# Patient Record
Sex: Female | Born: 1995 | Race: White | Hispanic: No | Marital: Married | Smoking: Never smoker
Health system: Southern US, Community
[De-identification: ages and names within clinical notes are randomized; demographics above are authoritative.]

## PROBLEM LIST (undated history)

## (undated) ENCOUNTER — Inpatient Hospital Stay (HOSPITAL_COMMUNITY): Payer: Self-pay

## (undated) DIAGNOSIS — K8020 Calculus of gallbladder without cholecystitis without obstruction: Secondary | ICD-10-CM

## (undated) DIAGNOSIS — Z9889 Other specified postprocedural states: Secondary | ICD-10-CM

## (undated) DIAGNOSIS — L259 Unspecified contact dermatitis, unspecified cause: Principal | ICD-10-CM

## (undated) DIAGNOSIS — G43909 Migraine, unspecified, not intractable, without status migrainosus: Secondary | ICD-10-CM

## (undated) DIAGNOSIS — N1370 Vesicoureteral-reflux, unspecified: Secondary | ICD-10-CM

## (undated) DIAGNOSIS — F419 Anxiety disorder, unspecified: Principal | ICD-10-CM

## (undated) DIAGNOSIS — N3281 Overactive bladder: Secondary | ICD-10-CM

## (undated) DIAGNOSIS — R61 Generalized hyperhidrosis: Secondary | ICD-10-CM

## (undated) DIAGNOSIS — J329 Chronic sinusitis, unspecified: Secondary | ICD-10-CM

## (undated) DIAGNOSIS — R112 Nausea with vomiting, unspecified: Secondary | ICD-10-CM

## (undated) DIAGNOSIS — E669 Obesity, unspecified: Secondary | ICD-10-CM

## (undated) DIAGNOSIS — B019 Varicella without complication: Secondary | ICD-10-CM

## (undated) DIAGNOSIS — F4010 Social phobia, unspecified: Secondary | ICD-10-CM

## (undated) DIAGNOSIS — Z9109 Other allergy status, other than to drugs and biological substances: Secondary | ICD-10-CM

## (undated) DIAGNOSIS — J301 Allergic rhinitis due to pollen: Secondary | ICD-10-CM

## (undated) DIAGNOSIS — K219 Gastro-esophageal reflux disease without esophagitis: Secondary | ICD-10-CM

## (undated) DIAGNOSIS — F329 Major depressive disorder, single episode, unspecified: Principal | ICD-10-CM

## (undated) HISTORY — PX: OTHER SURGICAL HISTORY: SHX169

## (undated) HISTORY — DX: Obesity, unspecified: E66.9

## (undated) HISTORY — DX: Vesicoureteral-reflux, unspecified: N13.70

## (undated) HISTORY — DX: Varicella without complication: B01.9

## (undated) HISTORY — DX: Overactive bladder: N32.81

## (undated) HISTORY — DX: Migraine, unspecified, not intractable, without status migrainosus: G43.909

## (undated) HISTORY — DX: Major depressive disorder, single episode, unspecified: F32.9

## (undated) HISTORY — DX: Allergic rhinitis due to pollen: J30.1

## (undated) HISTORY — DX: Social phobia, unspecified: F40.10

## (undated) HISTORY — DX: Other allergy status, other than to drugs and biological substances: Z91.09

## (undated) HISTORY — DX: Chronic sinusitis, unspecified: J32.9

## (undated) HISTORY — DX: Anxiety disorder, unspecified: F41.9

## (undated) HISTORY — DX: Unspecified contact dermatitis, unspecified cause: L25.9

## (undated) HISTORY — DX: Generalized hyperhidrosis: R61

---
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
## 2013-01-06 ENCOUNTER — Other Ambulatory Visit: Payer: Self-pay | Admitting: Family Medicine

## 2013-01-29 ENCOUNTER — Ambulatory Visit (INDEPENDENT_AMBULATORY_CARE_PROVIDER_SITE_OTHER): Payer: BC Managed Care – PPO | Admitting: Family Medicine

## 2013-01-29 ENCOUNTER — Encounter: Payer: Self-pay | Admitting: Family Medicine

## 2013-01-29 VITALS — BP 116/80 | HR 71 | Temp 99.0°F | Ht 63.25 in | Wt 190.4 lb

## 2013-01-29 DIAGNOSIS — E669 Obesity, unspecified: Secondary | ICD-10-CM

## 2013-01-29 DIAGNOSIS — F4010 Social phobia, unspecified: Secondary | ICD-10-CM

## 2013-01-29 DIAGNOSIS — J329 Chronic sinusitis, unspecified: Secondary | ICD-10-CM

## 2013-01-29 DIAGNOSIS — F419 Anxiety disorder, unspecified: Secondary | ICD-10-CM

## 2013-01-29 DIAGNOSIS — F329 Major depressive disorder, single episode, unspecified: Secondary | ICD-10-CM

## 2013-01-29 MED ORDER — HYDROCOD POLST-CHLORPHEN POLST 10-8 MG/5ML PO LQCR: 5.0000 mL | Freq: Every evening | ORAL | Status: DC | PRN

## 2013-01-29 MED ORDER — GUAIFENESIN ER 600 MG PO TB12: 600.0000 mg | ORAL_TABLET | Freq: Two times a day (BID) | ORAL | Status: DC

## 2013-01-29 MED ORDER — CITALOPRAM HYDROBROMIDE 20 MG PO TABS: 20.0000 mg | ORAL_TABLET | Freq: Every day | ORAL | Status: DC

## 2013-01-29 MED ORDER — AMOXICILLIN-POT CLAVULANATE 875-125 MG PO TABS: 1.0000 | ORAL_TABLET | Freq: Two times a day (BID) | ORAL | Status: DC

## 2013-01-29 NOTE — Patient Instructions (Addendum)
Hydrate, probiotics  Sinusitis Sinusitis is redness, soreness, and swelling (inflammation) of the paranasal sinuses. Paranasal sinuses are air pockets within the bones of your face (beneath the eyes, the middle of the forehead, or above the eyes). In healthy paranasal sinuses, mucus is able to drain out, and air is able to circulate through them by way of your nose. However, when your paranasal sinuses are inflamed, mucus and air can become trapped. This can allow bacteria and other germs to grow and cause infection. Sinusitis can develop quickly and last only a short time (acute) or continue over a long period (chronic). Sinusitis that lasts for more than 12 weeks is considered chronic.  CAUSES  Causes of sinusitis include:  Allergies.  Structural abnormalities, such as displacement of the cartilage that separates your nostrils (deviated septum), which can decrease the air flow through your nose and sinuses and affect sinus drainage.  Functional abnormalities, such as when the small hairs (cilia) that line your sinuses and help remove mucus do not work properly or are not present. SYMPTOMS  Symptoms of acute and chronic sinusitis are the same. The primary symptoms are pain and pressure around the affected sinuses. Other symptoms include:  Upper toothache.  Earache.  Headache.  Bad breath.  Decreased sense of smell and taste.  A cough, which worsens when you are lying flat.  Fatigue.  Fever.  Thick drainage from your nose, which often is green and may contain pus (purulent).  Swelling and warmth over the affected sinuses. DIAGNOSIS  Your caregiver will perform a physical exam. During the exam, your caregiver may:  Look in your nose for signs of abnormal growths in your nostrils (nasal polyps).  Tap over the affected sinus to check for signs of infection.  View the inside of your sinuses (endoscopy) with a special imaging device with a light attached (endoscope), which is  inserted into your sinuses. If your caregiver suspects that you have chronic sinusitis, one or more of the following tests may be recommended:  Allergy tests.  Nasal culture A sample of mucus is taken from your nose and sent to a lab and screened for bacteria.  Nasal cytology A sample of mucus is taken from your nose and examined by your caregiver to determine if your sinusitis is related to an allergy. TREATMENT  Most cases of acute sinusitis are related to a viral infection and will resolve on their own within 10 days. Sometimes medicines are prescribed to help relieve symptoms (pain medicine, decongestants, nasal steroid sprays, or saline sprays).  However, for sinusitis related to a bacterial infection, your caregiver will prescribe antibiotic medicines. These are medicines that will help kill the bacteria causing the infection.  Rarely, sinusitis is caused by a fungal infection. In theses cases, your caregiver will prescribe antifungal medicine. For some cases of chronic sinusitis, surgery is needed. Generally, these are cases in which sinusitis recurs more than 3 times per year, despite other treatments. HOME CARE INSTRUCTIONS   Drink plenty of water. Water helps thin the mucus so your sinuses can drain more easily.  Use a humidifier.  Inhale steam 3 to 4 times a day (for example, sit in the bathroom with the shower running).  Apply a warm, moist washcloth to your face 3 to 4 times a day, or as directed by your caregiver.  Use saline nasal sprays to help moisten and clean your sinuses.  Take over-the-counter or prescription medicines for pain, discomfort, or fever only as directed by your caregiver.  SEEK IMMEDIATE MEDICAL CARE IF:  You have increasing pain or severe headaches.  You have nausea, vomiting, or drowsiness.  You have swelling around your face.  You have vision problems.  You have a stiff neck.  You have difficulty breathing. MAKE SURE YOU:   Understand these  instructions.  Will watch your condition.  Will get help right away if you are not doing well or get worse. Document Released: 12/09/2005 Document Revised: 03/02/2012 Document Reviewed: 12/24/2011 [facility] Patient Information 2013 [place], [place].

## 2013-01-29 NOTE — Progress Notes (Signed)
Patient ID: Robin Suarez, female   DOB: 04-12-1996, 17 y.o.   MRN: [id number] STEVIE ERTLE [id number] 17-Sep-1996 01/29/2013      Progress Note-Follow Up  Subjective  Chief Complaint  Chief Complaint  Patient presents with  . chest congestion    X 3 weeks  . Fever    low grade X 1 week  . Sore Throat    X 2 weeks    HPI  Patient is a 17 year old Caucasian female who is in today with a three-week history of worsening congestion. She had a lot of head congestion initially but is now moving into the chest. She's had intermittent low-grade fevers and chills. She struggled with malaise and myalgias. She has nasal congestion productive of clear rhinorrhea. Has a cough which is keeping her up at night. Smoker dry but occasionally productive. No chest pain or palpitations. No shortness or breath GI or GU complaints. Has been using over-the-counter sinus medications and Zyrtec without any great relief. Also notes some sore throat and ear pressure.  Past Medical History  Diagnosis Date  . Nickel allergy   . Chicken pox as a child  . Overactive bladder   . Vesico-ureteral reflux   . 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 Obesity 11/11/2011  . Social anxiety disorder 11/11/2011  . Contact dermatitis 06/04/2012  . Sinusitis 01/29/2013    Past Surgical History  Procedure Date  . Broken arm     left, repair of ulna and radius  . Tubes in ears 17 yr old    Family History  Problem Relation Age of Onset  . Diabetes Father     type 2  . Other Father     muscular spasm of the heart  . Hyperlipidemia Father   . Hyperlipidemia Maternal Grandmother   . Hypertension Maternal Grandmother   . Diabetes Maternal Grandmother     type 2  . Other Paternal Grandmother     arrythmia  . Diabetes Paternal Grandmother     type 2  . Diabetes Paternal Grandfather     type 2  . Hyperlipidemia Paternal Grandfather   . Other Paternal Grandfather     fluid around the heart  . Cancer Maternal  Grandfather     laryngeal, alcohol and tobacco    History   Social History  . Marital Status: Single    Spouse Name: N/A    Number of Children: N/A  . Years of Education: N/A   Occupational History  . Not on file.   Social History Main Topics  . Smoking status: Never Smoker   . Smokeless tobacco: Never Used  . Alcohol Use: No  . Drug Use: No  . Sexually Active: No   Other Topics Concern  . Not on file   Social History Narrative   Homeschooled, junior HS level.No sibs.  Lives with mom and dad near [place].Dad smokes.  Dogs in home.  Well water.    Current Outpatient Prescriptions on File Prior to Visit  Medication Sig Dispense Refill  . citalopram (CELEXA) 20 MG tablet Take 1 tablet (20 mg total) by mouth daily.  30 tablet  5  . HYPERCARE 20 % external solution APPLY TOPICALLY AT BEDTIME.  60 mL  0    Allergies  Allergen Reactions  . Latex     rash  . Cefprozil   . Nickel     Rash with contact  . Sulfa Antibiotics     Review of Systems  Review of Systems  Constitutional: Positive for malaise/fatigue. Negative for fever.  HENT: Positive for ear pain, congestion and sore throat.   Eyes: Negative for discharge.  Respiratory: Positive for cough and sputum production. Negative for shortness of breath.   Cardiovascular: Negative for chest pain, palpitations and leg swelling.  Gastrointestinal: Negative for nausea, abdominal pain and diarrhea.  Genitourinary: Negative for dysuria.  Musculoskeletal: Negative for falls.  Skin: Negative for rash.  Neurological: Positive for headaches. Negative for loss of consciousness.  Endo/Heme/Allergies: Negative for polydipsia.  Psychiatric/Behavioral: Negative for depression and suicidal ideas. The patient is not nervous/anxious and does not have insomnia.     Objective  BP 116/80  Pulse 71  Temp 99 F (37.2 C) (Temporal)  Ht 5' 3.25" (1.607 m)  Wt 190 lb 6.4 oz (86.365 kg)  BMI 33.46 kg/m2  SpO2 99%  LMP  01/08/2013  Physical Exam  Physical Exam  Constitutional: She is oriented to person, place, and time and well-developed, well-nourished, and in no distress. No distress.  HENT:  Head: Normocephalic and atraumatic.       Left TM erythematous, nasal mucosa boggy and erythematous  Eyes: Conjunctivae normal are normal.  Neck: Neck supple. No thyromegaly present.  Cardiovascular: Normal rate, regular rhythm and normal heart sounds.   No murmur heard. Pulmonary/Chest: Effort normal and breath sounds normal. She has no wheezes.  Abdominal: She exhibits no distension and no mass.  Musculoskeletal: She exhibits no edema.  Lymphadenopathy:    She has no cervical adenopathy.  Neurological: She is alert and oriented to person, place, and time.  Skin: Skin is warm and dry. No rash noted. She is not diaphoretic.  Psychiatric: Memory, affect and judgment normal.    Lab Results  Component Value Date   TSH 1.701 11/11/2011   Lab Results  Component Value Date   WBC 8.6 11/11/2011   HGB 15.1 11/11/2011   HCT 44.2 11/11/2011   MCV 92.1 11/11/2011   PLT 278 11/11/2011   Lab Results  Component Value Date   CREATININE 0.82 11/11/2011   BUN 17 11/11/2011   NA 140 11/11/2011   K 4.5 11/11/2011   CL 106 11/11/2011   CO2 26 11/11/2011   Lab Results  Component Value Date   ALT 39* 11/11/2011   AST 27 11/11/2011   ALKPHOS 97 11/11/2011   BILITOT 1.1 11/11/2011     Assessment & Plan  Social anxiety disorder Doing great given refills on Citalopram  Obesity Continues to loose weight, exercise and eat better  Sinusitis Augmentin, Mucinex, probiotics and Tussionex, increase rest and fluids

## 2013-01-29 NOTE — Assessment & Plan Note (Signed)
Augmentin, Mucinex, probiotics and Tussionex, increase rest and fluids

## 2013-01-29 NOTE — Assessment & Plan Note (Signed)
Doing great given refills on Citalopram

## 2013-01-29 NOTE — Assessment & Plan Note (Signed)
Continues to loose weight, exercise and eat better

## 2013-02-19 ENCOUNTER — Ambulatory Visit: Payer: BC Managed Care – PPO | Admitting: Family Medicine

## 2013-03-11 ENCOUNTER — Telehealth: Payer: Self-pay

## 2013-03-11 MED ORDER — QUETIAPINE FUMARATE 100 MG PO TABS: 100.0000 mg | ORAL_TABLET | ORAL | Status: DC

## 2013-03-11 NOTE — Telephone Encounter (Signed)
Per md pt needs to continue with Celebrex also.   Left a detailed message on pts dad phone per previous request today.

## 2013-03-11 NOTE — Telephone Encounter (Signed)
Per MD after looking at the mood disorder questionnaire it is a negative screen BUT borderline. Start Seroquel 100 mg 1/2 tab X 1 day, then 1 tab po qhs X 1 week, then 2 tabs qhs X 1 day, then 3 tabs qhs. Come in for an appt in 1-2 weeks.  Pts dad informed and states he had already made pt an appt for next wed.   Pts dad would like to know if pt should continue taking her other medication? Please advise?  Pts dad stated to leave a message on vm if he doesn't anser.  RX sent to pharmacy

## 2013-03-17 ENCOUNTER — Ambulatory Visit (INDEPENDENT_AMBULATORY_CARE_PROVIDER_SITE_OTHER): Payer: BC Managed Care – PPO | Admitting: Family Medicine

## 2013-03-17 ENCOUNTER — Encounter: Payer: Self-pay | Admitting: Family Medicine

## 2013-03-17 VITALS — BP 106/68 | HR 57 | Temp 97.6°F | Ht 64.25 in | Wt 186.0 lb

## 2013-03-17 DIAGNOSIS — F341 Dysthymic disorder: Secondary | ICD-10-CM

## 2013-03-17 DIAGNOSIS — F419 Anxiety disorder, unspecified: Secondary | ICD-10-CM

## 2013-03-17 MED ORDER — OLANZAPINE 2.5 MG PO TABS: 2.5000 mg | ORAL_TABLET | Freq: Every day | ORAL | Status: DC

## 2013-03-17 MED ORDER — CITALOPRAM HYDROBROMIDE 20 MG PO TABS
40.0000 mg | ORAL_TABLET | Freq: Every day | ORAL | Status: DC
Start: 2013-03-17 — End: 2014-06-21

## 2013-03-21 ENCOUNTER — Encounter: Payer: Self-pay | Admitting: Family Medicine

## 2013-03-21 NOTE — Assessment & Plan Note (Signed)
Was going to add Seroquel but she is worried about weight gain. Is encouraged to add Zyprexa qhs and continue SSRI and Alprazolam sparingly.

## 2013-03-21 NOTE — Progress Notes (Signed)
Patient ID: Robin Suarez, female   DOB: Mar 21, 1996, 17 y.o.   MRN: [id number] Robin Suarez [id number] 02-Oct-1996 03/21/2013      Progress Note-Follow Up  Subjective  Chief Complaint  Chief Complaint  Patient presents with  . Follow-up    on Seroquel    HPI  Patient is a 17-year-old Caucasian is worsening depression and anxiety. After doing a bipolar screen which came back equivocal but suggestive of bipolar she was given a prescription for Seroquel but chose not to start it due to concerned about weight gain. She is having low mood and difficulty concentrating but denies any suicidal ideation or intent to harm. Does struggle with anhedonia. No return of agoraphobia. Continues to take her citalopram. No other acute illness. No fevers or chills, chest pain, palpitations, shortness of breath, GI or complaints  Past Medical History  Diagnosis Date  . Nickel allergy   . Chicken pox as a child  . Overactive bladder   . Vesico-ureteral reflux   . 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 Obesity 11/11/2011  . Social anxiety disorder 11/11/2011  . Contact dermatitis 06/04/2012  . Sinusitis 01/29/2013  . Anxiety and depression 11/11/2011    Past Surgical History  Procedure Laterality Date  . Broken arm      left, repair of ulna and radius  . Tubes in ears  17 yr old    Family History  Problem Relation Age of Onset  . Diabetes Father     type 2  . Other Father     muscular spasm of the heart  . Hyperlipidemia Father   . Hyperlipidemia Maternal Grandmother   . Hypertension Maternal Grandmother   . Diabetes Maternal Grandmother     type 2  . Other Paternal Grandmother     arrythmia  . Diabetes Paternal Grandmother     type 2  . Diabetes Paternal Grandfather     type 2  . Hyperlipidemia Paternal Grandfather   . Other Paternal Grandfather     fluid around the heart  . Cancer Maternal Grandfather     laryngeal, alcohol and tobacco    History   Social History  .  Marital Status: Single    Spouse Name: N/A    Number of Children: N/A  . Years of Education: N/A   Occupational History  . Not on file.   Social History Main Topics  . Smoking status: Never Smoker   . Smokeless tobacco: Never Used  . Alcohol Use: No  . Drug Use: No  . Sexually Active: No   Other Topics Concern  . Not on file   Social History Narrative   Homeschooled, junior HS level.   No sibs.  Lives with mom and dad near [place].   Dad smokes.  Dogs in home.  Well water.          Current Outpatient Prescriptions on File Prior to Visit  Medication Sig Dispense Refill  . HYPERCARE 20 % external solution APPLY TOPICALLY AT BEDTIME.  60 mL  0   No current facility-administered medications on file prior to visit.    Allergies  Allergen Reactions  . Latex     rash  . Cefprozil   . Nickel     Rash with contact  . Sulfa Antibiotics     Review of Systems  Review of Systems  Constitutional: Negative for fever and malaise/fatigue.  HENT: Negative for congestion.   Eyes: Negative for discharge.  Respiratory: Negative for shortness of  breath.   Cardiovascular: Negative for chest pain, palpitations and leg swelling.  Gastrointestinal: Negative for nausea, abdominal pain and diarrhea.  Genitourinary: Negative for dysuria.  Musculoskeletal: Negative for falls.  Skin: Negative for rash.  Neurological: Negative for loss of consciousness and headaches.  Endo/Heme/Allergies: Negative for polydipsia.  Psychiatric/Behavioral: Positive for depression. Negative for suicidal ideas. The patient is nervous/anxious. The patient does not have insomnia.     Objective  BP 106/68  Pulse 57  Temp(Src) 97.6 F (36.4 C) (Temporal)  Ht 5' 4.25" (1.632 m)  Wt 186 lb (84.369 kg)  BMI 31.68 kg/m2  SpO2 99%  LMP 03/03/2013  Physical Exam  Physical Exam  Constitutional: She is oriented to person, place, and time and well-developed, well-nourished, and in no distress. No distress.   HENT:  Head: Normocephalic and atraumatic.  Eyes: Conjunctivae are normal.  Neck: Neck supple. No thyromegaly present.  Cardiovascular: Normal rate, regular rhythm and normal heart sounds.   No murmur heard. Pulmonary/Chest: Effort normal and breath sounds normal. She has no wheezes.  Abdominal: She exhibits no distension and no mass.  Musculoskeletal: She exhibits no edema.  Lymphadenopathy:    She has no cervical adenopathy.  Neurological: She is alert and oriented to person, place, and time.  Skin: Skin is warm and dry. No rash noted. She is not diaphoretic.  Psychiatric: Memory, affect and judgment normal.    Lab Results  Component Value Date   TSH 1.701 11/11/2011   Lab Results  Component Value Date   WBC 8.6 11/11/2011   HGB 15.1 11/11/2011   HCT 44.2 11/11/2011   MCV 92.1 11/11/2011   PLT 278 11/11/2011   Lab Results  Component Value Date   CREATININE 0.82 11/11/2011   BUN 17 11/11/2011   NA 140 11/11/2011   K 4.5 11/11/2011   CL 106 11/11/2011   CO2 26 11/11/2011   Lab Results  Component Value Date   ALT 39* 11/11/2011   AST 27 11/11/2011   ALKPHOS 97 11/11/2011   BILITOT 1.1 11/11/2011     Assessment & Plan  Anxiety and depression Was going to add Seroquel but she is worried about weight gain. Is encouraged to add Zyprexa qhs and continue SSRI and Alprazolam sparingly.

## 2013-04-19 ENCOUNTER — Ambulatory Visit: Payer: BC Managed Care – PPO | Admitting: Family Medicine

## 2013-04-23 ENCOUNTER — Ambulatory Visit: Payer: BC Managed Care – PPO | Admitting: Family Medicine

## 2013-07-21 ENCOUNTER — Encounter: Payer: BC Managed Care – PPO | Admitting: Family Medicine

## 2013-07-21 ENCOUNTER — Ambulatory Visit: Payer: BC Managed Care – PPO | Admitting: Family Medicine

## 2014-06-21 ENCOUNTER — Encounter: Payer: Self-pay | Admitting: Family Medicine

## 2014-06-21 ENCOUNTER — Ambulatory Visit (HOSPITAL_BASED_OUTPATIENT_CLINIC_OR_DEPARTMENT_OTHER)
Admission: RE | Admit: 2014-06-21 | Discharge: 2014-06-21 | Disposition: A | Discharge status: Home / Self Care | Payer: BC Managed Care – PPO | Source: Ambulatory Visit | Attending: Family Medicine | Admitting: Family Medicine

## 2014-06-21 ENCOUNTER — Ambulatory Visit (INDEPENDENT_AMBULATORY_CARE_PROVIDER_SITE_OTHER): Payer: BC Managed Care – PPO | Admitting: Family Medicine

## 2014-06-21 VITALS — BP 122/64 | HR 102 | Temp 98.5°F | Ht 63.25 in | Wt 180.1 lb

## 2014-06-21 DIAGNOSIS — K8020 Calculus of gallbladder without cholecystitis without obstruction: Secondary | ICD-10-CM

## 2014-06-21 DIAGNOSIS — R740 Nonspecific elevation of levels of transaminase and lactic acid dehydrogenase [LDH]: Secondary | ICD-10-CM

## 2014-06-21 DIAGNOSIS — R748 Abnormal levels of other serum enzymes: Secondary | ICD-10-CM

## 2014-06-21 DIAGNOSIS — R7402 Elevation of levels of lactic acid dehydrogenase (LDH): Secondary | ICD-10-CM

## 2014-06-21 DIAGNOSIS — R7401 Elevation of levels of liver transaminase levels: Secondary | ICD-10-CM

## 2014-06-21 DIAGNOSIS — R634 Abnormal weight loss: Secondary | ICD-10-CM

## 2014-06-21 DIAGNOSIS — E669 Obesity, unspecified: Secondary | ICD-10-CM

## 2014-06-21 LAB — CBC
HEMATOCRIT: 42.8 % (ref 36.0–46.0)
Hemoglobin: 15 g/dL (ref 12.0–15.0)
MCH: 31.1 pg (ref 26.0–34.0)
MCHC: 35 g/dL (ref 30.0–36.0)
MCV: 88.6 fL (ref 78.0–100.0)
Platelets: 254 10*3/uL (ref 150–400)
RBC: 4.83 MIL/uL (ref 3.87–5.11)
RDW: 13.8 % (ref 11.5–15.5)
WBC: 4.9 10*3/uL (ref 4.0–10.5)

## 2014-06-21 NOTE — Patient Instructions (Signed)
Gillbert's syndrome   Cholelithiasis Cholelithiasis (also called gallstones) is a form of gallbladder disease in which gallstones form in your gallbladder. The gallbladder is an organ that stores bile made in the liver, which helps digest fats. Gallstones begin as small crystals and slowly grow into stones. Gallstone pain occurs when the gallbladder spasms and a gallstone is blocking the duct. Pain can also occur when a stone passes out of the duct.  RISK FACTORS  Being female.   Having multiple pregnancies. Health care providers sometimes advise removing diseased gallbladders before future pregnancies.   Being obese.  Eating a diet heavy in fried foods and fat.   Being older than 60 years and increasing age.   Prolonged use of medicines containing female hormones.   Having diabetes mellitus.   Rapidly losing weight.   Having a family history of gallstones (heredity).  SYMPTOMS  Nausea.   Vomiting.  Abdominal pain.   Yellowing of the skin (jaundice).   Sudden pain. It may persist from several minutes to several hours.  Fever.   Tenderness to the touch. In some cases, when gallstones do not move into the bile duct, people have no pain or symptoms. These are called "silent" gallstones.  TREATMENT Silent gallstones do not need treatment. In severe cases, emergency surgery may be required. Options for treatment include:  Surgery to remove the gallbladder. This is the most common treatment.  Medicines. These do not always work and may take 6-12 months or more to work.  Shock wave treatment (extracorporeal biliary lithotripsy). In this treatment an ultrasound machine sends shock waves to the gallbladder to break gallstones into smaller pieces that can pass into the intestines or be dissolved by medicine. HOME CARE INSTRUCTIONS   Only take over-the-counter or prescription medicines for pain, discomfort, or fever as directed by your health care Robin Suarez.    Follow a low-fat diet until seen again by your health care Robin Suarez. Fat causes the gallbladder to contract, which can result in pain.   Follow up with your health care Robin Suarez as directed. Attacks are almost always recurrent and surgery is usually required for permanent treatment.  SEEK IMMEDIATE MEDICAL CARE IF:   Your pain increases and is not controlled by medicines.   You have a fever or persistent symptoms for more than 2-3 days.   You have a fever and your symptoms suddenly get worse.   You have persistent nausea and vomiting.  MAKE SURE YOU:   Understand these instructions.  Will watch your condition.  Will get help right away if you are not doing well or get worse. Document Released: 12/05/2005 Document Revised: 08/11/2013 Document Reviewed: 06/02/2013 [facility] Patient Information 2015 [place], [place]. This information is not intended to replace advice given to you by your health care Paulena Servais. Make sure you discuss any questions you have with your health care Ammar Moffatt.

## 2014-06-22 LAB — HEPATIC FUNCTION PANEL
ALBUMIN: 4.7 g/dL (ref 3.5–5.2)
ALK PHOS: 64 U/L (ref 39–117)
ALT: 111 U/L — ABNORMAL HIGH (ref 0–35)
AST: 39 U/L — AB (ref 0–37)
Bilirubin, Direct: 0.5 mg/dL — ABNORMAL HIGH (ref 0.0–0.3)
Indirect Bilirubin: 1 mg/dL (ref 0.2–1.1)
TOTAL PROTEIN: 6.8 g/dL (ref 6.0–8.3)
Total Bilirubin: 1.5 mg/dL — ABNORMAL HIGH (ref 0.2–1.1)

## 2014-06-22 LAB — RENAL FUNCTION PANEL
ALBUMIN: 4.7 g/dL (ref 3.5–5.2)
BUN: 8 mg/dL (ref 6–23)
CHLORIDE: 102 meq/L (ref 96–112)
CO2: 22 mEq/L (ref 19–32)
CREATININE: 0.83 mg/dL (ref 0.50–1.10)
Calcium: 10 mg/dL (ref 8.4–10.5)
Glucose, Bld: 75 mg/dL (ref 70–99)
PHOSPHORUS: 3.1 mg/dL (ref 2.3–4.6)
Potassium: 4.3 mEq/L (ref 3.5–5.3)
Sodium: 137 mEq/L (ref 135–145)

## 2014-06-22 LAB — HEPATITIS PANEL, ACUTE
HCV Ab: NEGATIVE
HEP B S AG: NEGATIVE
Hep A IgM: NONREACTIVE
Hep B C IgM: NONREACTIVE

## 2014-06-25 ENCOUNTER — Encounter: Payer: Self-pay | Admitting: Family Medicine

## 2014-06-25 DIAGNOSIS — R748 Abnormal levels of other serum enzymes: Secondary | ICD-10-CM | POA: Insufficient documentation

## 2014-06-25 DIAGNOSIS — R634 Abnormal weight loss: Secondary | ICD-10-CM | POA: Insufficient documentation

## 2014-06-25 NOTE — Progress Notes (Signed)
Patient ID: Robin Suarez, female   DOB: September 05, 1996, 18 y.o.   MRN: [id number] Robin Suarez [id number] September 05, 1996 06/25/2014      Progress Note-Follow Up  Subjective  Chief Complaint  Chief Complaint  Patient presents with  . labs from Dr Danielle DessEagelman showed elevated liver enzymes    HPI  Patient is a 18 year old female in today for routine medical care. Patient is in today for followup on some elevated liver functions which were discovered at her OB/GYN visit. She denies any abdominal pain or change in bowel habits. Does acknowledge some recent weight loss without significant effort. No diarrhea. No fevers or chills. No chest pain, palpitations or shortness of breath.  Past Medical History  Diagnosis Date  . Nickel allergy   . Chicken pox as a child  . Overactive bladder   . Vesico-ureteral reflux   . 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 Obesity 11/11/2011  . Social anxiety disorder 11/11/2011  . Contact dermatitis 06/04/2012  . Sinusitis 01/29/2013  . Anxiety and depression 11/11/2011    Past Surgical History  Procedure Laterality Date  . Broken arm      left, repair of ulna and radius  . Tubes in ears  18 yr old    Family History  Problem Relation Age of Onset  . Diabetes Father     type 2  . Other Father     muscular spasm of the heart  . Hyperlipidemia Father   . Hyperlipidemia Maternal Grandmother   . Hypertension Maternal Grandmother   . Diabetes Maternal Grandmother     type 2  . Other Paternal Grandmother     arrythmia  . Diabetes Paternal Grandmother     type 2  . Diabetes Paternal Grandfather     type 2  . Hyperlipidemia Paternal Grandfather   . Other Paternal Grandfather     fluid around the heart  . Cancer Maternal Grandfather     laryngeal, alcohol and tobacco    History   Social History  . Marital Status: Single    Spouse Name: N/A    Number of Children: N/A  . Years of Education: N/A   Occupational History  . Not on file.    Social History Main Topics  . Smoking status: Never Smoker   . Smokeless tobacco: Never Used  . Alcohol Use: No  . Drug Use: No  . Sexual Activity: No   Other Topics Concern  . Not on file   Social History Narrative   Homeschooled, junior HS level.   No sibs.  [place].   Dad smokes.  Dogs in home.  Well water.          No current outpatient prescriptions on file prior to visit.   No current facility-administered medications on file prior to visit.    Allergies  Allergen Reactions  . Latex     rash  . Cefprozil   . Nickel     Rash with contact  . Sulfa Antibiotics     Review of Systems  Review of Systems  Constitutional: Negative for fever and malaise/fatigue.  HENT: Negative for congestion.   Eyes: Negative for discharge.  Respiratory: Negative for shortness of breath.   Cardiovascular: Negative for chest pain, palpitations and leg swelling.  Gastrointestinal: Negative for nausea, abdominal pain and diarrhea.  Genitourinary: Negative for dysuria.  Musculoskeletal: Negative for falls.  Skin: Negative for rash.  Neurological: Negative for loss of consciousness  and headaches.  Endo/Heme/Allergies: Negative for polydipsia.  Psychiatric/Behavioral: Negative for depression and suicidal ideas. The patient is not nervous/anxious and does not have insomnia.     Objective  BP 122/64  Pulse 102  Temp(Src) 98.5 F (36.9 C) (Oral)  Ht 5' 3.25" (1.607 m)  Wt 180 lb 1.9 oz (81.702 kg)  BMI 31.64 kg/m2  SpO2 97%  LMP 05/30/2014  Physical Exam  Physical Exam  Constitutional: She is oriented to person, place, and time and well-developed, well-nourished, and in no distress. No distress.  HENT:  Head: Normocephalic and atraumatic.  Eyes: Conjunctivae are normal.  Neck: Neck supple. No thyromegaly present.  Cardiovascular: Normal rate, regular rhythm and normal heart sounds.   No murmur heard. Pulmonary/Chest: Effort normal and breath  sounds normal. She has no wheezes.  Abdominal: She exhibits no distension and no mass.  Musculoskeletal: She exhibits no edema.  Lymphadenopathy:    She has no cervical adenopathy.  Neurological: She is alert and oriented to person, place, and time.  Skin: Skin is warm and dry. No rash noted. She is not diaphoretic.  Psychiatric: Memory, affect and judgment normal.    Lab Results  Component Value Date   TSH 1.701 11/11/2011   Lab Results  Component Value Date   WBC 4.9 06/21/2014   HGB 15.0 06/21/2014   HCT 42.8 06/21/2014   MCV 88.6 06/21/2014   PLT 254 06/21/2014   Lab Results  Component Value Date   CREATININE 0.83 06/21/2014   BUN 8 06/21/2014   NA 137 06/21/2014   K 4.3 06/21/2014   CL 102 06/21/2014   CO2 22 06/21/2014   Lab Results  Component Value Date   ALT 111* 06/21/2014   AST 39* 06/21/2014   ALKPHOS 64 06/21/2014   BILITOT 1.5* 06/21/2014   No results found for this basename: CHOL   No results found for this basename: HDL   No results found for this basename: LDLCALC   No results found for this basename: TRIG   No results found for this basename: CHOLHDL     Assessment & Plan  Obesity Encouraged DASH diet, decrease po intake and increase exercise as tolerated. Needs 7-8 hours of sleep nightly. Avoid trans fats, eat small, frequent meals every 4-5 hours with lean proteins, complex carbs and healthy fats. Minimize simple carbs  Abnormal AST and ALT With elevated bilirubin and gallstones, will refer to general surgery for further consideration

## 2014-06-25 NOTE — Assessment & Plan Note (Signed)
Encouraged DASH diet, decrease po intake and increase exercise as tolerated. Needs 7-8 hours of sleep nightly. Avoid trans fats, eat small, frequent meals every 4-5 hours with lean proteins, complex carbs and healthy fats. Minimize simple carbs 

## 2014-06-25 NOTE — Assessment & Plan Note (Signed)
With elevated bilirubin and gallstones, will refer to general surgery for further consideration

## 2014-06-27 ENCOUNTER — Telehealth: Payer: Self-pay

## 2014-06-27 NOTE — Telephone Encounter (Signed)
Message copied by Court JoyFREEMAN, Jensen Cheramie L on Mon Jun 27, 2014  9:39 AM ------      Message from: Danise EdgeBLYTH, STACEY A      Created: Sat Jun 25, 2014  4:40 PM       Reviewed her chart again, I think with her gallstones and elevated lfts and elevated bili I am going to refer her to general surgery for consideration ------

## 2014-06-27 NOTE — Telephone Encounter (Signed)
Will inform pt.

## 2014-06-27 NOTE — Telephone Encounter (Signed)
Pt informed and voiced understanding

## 2014-06-29 ENCOUNTER — Encounter: Payer: Self-pay | Admitting: Gastroenterology

## 2014-06-29 ENCOUNTER — Telehealth: Payer: Self-pay | Admitting: Family Medicine

## 2014-06-29 ENCOUNTER — Other Ambulatory Visit: Payer: Self-pay | Admitting: Family Medicine

## 2014-06-29 DIAGNOSIS — K8020 Calculus of gallbladder without cholecystitis without obstruction: Secondary | ICD-10-CM

## 2014-06-29 NOTE — Telephone Encounter (Signed)
Set referral

## 2014-06-29 NOTE — Telephone Encounter (Signed)
Patients mother left message stating that the patient does not want to have to go through a GI work up she would rather go to general surgeon who can remove her gallbladder.

## 2014-07-05 ENCOUNTER — Telehealth: Payer: Self-pay | Admitting: *Deleted

## 2014-07-05 NOTE — Telephone Encounter (Signed)
Pt left message on 07/04/14 requesting most recent labs and u/s results be faxed to Dr Ernestina PennaFogleman at [phone]. Pt has appt with her on 07/11/14. Info faxed and pt notified.

## 2014-07-15 ENCOUNTER — Ambulatory Visit (INDEPENDENT_AMBULATORY_CARE_PROVIDER_SITE_OTHER): Payer: BC Managed Care – PPO | Admitting: General Surgery

## 2014-07-15 ENCOUNTER — Encounter (INDEPENDENT_AMBULATORY_CARE_PROVIDER_SITE_OTHER): Payer: Self-pay | Admitting: General Surgery

## 2014-07-15 VITALS — BP 130/70 | HR 80 | Temp 98.0°F | Ht 64.0 in | Wt 169.0 lb

## 2014-07-15 DIAGNOSIS — R7401 Elevation of levels of liver transaminase levels: Secondary | ICD-10-CM

## 2014-07-15 DIAGNOSIS — K8020 Calculus of gallbladder without cholecystitis without obstruction: Secondary | ICD-10-CM | POA: Insufficient documentation

## 2014-07-15 DIAGNOSIS — R7402 Elevation of levels of lactic acid dehydrogenase (LDH): Secondary | ICD-10-CM

## 2014-07-15 DIAGNOSIS — R740 Nonspecific elevation of levels of transaminase and lactic acid dehydrogenase [LDH]: Secondary | ICD-10-CM

## 2014-07-15 DIAGNOSIS — R748 Abnormal levels of other serum enzymes: Secondary | ICD-10-CM

## 2014-07-15 NOTE — Progress Notes (Signed)
Patient ID: Robin Suarez, female   DOB: 11-26-1996, 18 y.o.   MRN: [id number]  Chief Complaint  Patient presents with  . eval gallstones    HPI Robin Suarez is a 18 y.o. female.   HPI 18 year old Caucasian female referred by Dr. Abner GreenspanBlyth for evaluation of elevated transaminases. The patient had weight issues and was placed on phentermine to help with weight loss. She underwent LFT surveillance and was found to have some elevated liver function test which prompted an abdominal ultrasound and referral to her primary care physician. She was found to have gallstones and a normal common bile duct on her ultrasound. She states initially she didn't have any abdominal discomfort however of late she's had a few episodes of abdominal discomfort. She had a remote episode after eating fried chicken tenders where she vomited without having abdominal pain. Most recently she has noticed some episodes with certain foods where she will get a sharp pain like a spasm in her right upper abdomen which will radiate to her back. This occurred with broccoli. It lasted for several hours. She tried times without any relief. She states she now has constant heartburn. She denies any melena, hematochezia, acholic stools. She denies any NSAID use. She denies any hematemesis. She has lost about 32 pounds as part of a planned weight loss.. Past Medical History  Diagnosis Date  . Nickel allergy   . Chicken pox as a child  . Overactive bladder   . Vesico-ureteral reflux   . 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 Obesity 11/11/2011  . Social anxiety disorder 11/11/2011  . Contact dermatitis 06/04/2012  . Sinusitis 01/29/2013  . Anxiety and depression 11/11/2011    Past Surgical History  Procedure Laterality Date  . Broken arm      left, repair of ulna and radius  . Tubes in ears  18 yr old    Family History  Problem Relation Age of Onset  . Diabetes Father     type 2  . Other Father     muscular spasm of the heart   . Hyperlipidemia Father   . Hyperlipidemia Maternal Grandmother   . Hypertension Maternal Grandmother   . Diabetes Maternal Grandmother     type 2  . Other Paternal Grandmother     arrythmia  . Diabetes Paternal Grandmother     type 2  . Diabetes Paternal Grandfather     type 2  . Hyperlipidemia Paternal Grandfather   . Other Paternal Grandfather     fluid around the heart  . Cancer Maternal Grandfather     laryngeal, alcohol and tobacco    Social History History  Substance Use Topics  . Smoking status: Never Smoker   . Smokeless tobacco: Never Used  . Alcohol Use: No    Allergies  Allergen Reactions  . Latex     rash  . Cefprozil   . Nickel     Rash with contact  . Sulfa Antibiotics     Current Outpatient Prescriptions  Medication Sig Dispense Refill  . cholecalciferol (VITAMIN D) 1000 UNITS tablet Take 1,000 Units by mouth daily.      . norethindrone-ethinyl estradiol (JUNEL FE,GILDESS FE,LOESTRIN FE) 1-20 MG-MCG tablet Take 1 tablet by mouth daily.      . phentermine 37.5 MG capsule Take 37.5 mg by mouth every morning.       No current facility-administered medications for this visit.    Review of Systems Review of Systems  Constitutional: Negative for fever,  activity change, appetite change and unexpected weight change.  HENT: Negative for nosebleeds and trouble swallowing.   Eyes: Negative for photophobia and visual disturbance.  Respiratory: Negative for chest tightness and shortness of breath.   Cardiovascular: Negative for chest pain and leg swelling.       Denies CP, SOB  Gastrointestinal: Positive for nausea and abdominal pain.  Genitourinary: Negative for dysuria and difficulty urinating.  Musculoskeletal: Negative for arthralgias.  Skin: Negative for pallor and rash.  Neurological: Negative for dizziness, seizures, facial asymmetry and numbness.          Hematological: Negative for adenopathy. Does not bruise/bleed easily.    Psychiatric/Behavioral: Negative for behavioral problems and agitation.    Blood pressure 130/70, pulse 80, temperature 98 F (36.7 C), height 5\' 4"  (1.626 m), weight 169 lb (76.658 kg), last menstrual period 05/30/2014.  Physical Exam Physical Exam  Vitals reviewed. Constitutional: She is oriented to person, place, and time. She appears well-developed and well-nourished. No distress.  HENT:  Head: Normocephalic and atraumatic.  Right Ear: External ear normal.  Left Ear: External ear normal.  Eyes: Conjunctivae are normal. No scleral icterus.  Neck: Normal range of motion. Neck supple. No tracheal deviation present. No thyromegaly present.  Cardiovascular: Normal rate and normal heart sounds.   Pulmonary/Chest: Effort normal and breath sounds normal. No stridor. No respiratory distress. She has no wheezes.  Abdominal: Soft. She exhibits no distension. There is no tenderness. There is no rebound and no guarding.    Musculoskeletal: She exhibits no edema and no tenderness.  Lymphadenopathy:    She has no cervical adenopathy.  Neurological: She is alert and oriented to person, place, and time. She exhibits normal muscle tone.  Skin: Skin is warm and dry. No rash noted. She is not diaphoretic. No erythema.  Psychiatric: She has a normal mood and affect. Her behavior is normal. Judgment and thought content normal.    Data Reviewed abd u/s - +gallstones; nml common bile duct Dr Mariel AloeBlyth's office note Labs - 06/21/14 - T bili 1.5; AST 111, ALT 39, AP 64  Assessment    Symptomatic cholelithiasis Elevated AST/ALT     Plan    I believe the patient's symptoms are consistent with gallbladder disease. However I did explain to the patient and her parents that her transaminase elevation could be due to passage of a gallstone or a side effect from her medication. Nonetheless she has developed some symptoms which are consistent with symptomatic cholelithiasis  We discussed gallbladder  disease. The patient was given Agricultural engineereducational material. We discussed non-operative and operative management. We discussed the signs & symptoms of acute cholecystitis  I discussed laparoscopic cholecystectomy with IOC in detail.  The patient was given educational material as well as diagrams detailing the procedure.  We discussed the risks and benefits of a laparoscopic cholecystectomy including, but not limited to bleeding, infection, injury to surrounding structures such as the intestine or liver, bile leak, retained gallstones, need to convert to an open procedure, prolonged diarrhea, blood clots such as  DVT, common bile duct injury, anesthesia risks, and possible need for additional procedures such as ERCP.  We discussed the typical post-operative recovery course. I explained that the likelihood of improvement of their symptoms is good.   We will repeat her LFTs prior to surgery. If her LFTs are still elevated and her interoperative CHOLANGIOGRAM is normal, I explained this may prompt additional workup. All of their questions were asked and answered.  She has elected to  proceed with surgery. Our schedulers will call her in the next several days to schedule surgery. In the interim, she was given access to watch EMMI video on cholecystectomy  Mary Sella. Andrey Campanile, MD, FACS General, Bariatric, & Minimally Invasive Surgery [facility], [place]        [facility] M 07/15/2014, 2:01 PM

## 2014-07-15 NOTE — Patient Instructions (Signed)
Your current discomfort is due to gallbladder disease Your elevated AST and ALT are more than likely due to passage of a gallstone; however, it may be due to a side effect from the medication Our office will contact you to schedule surgery in the next few days. Please call if you have any additional questions

## 2014-08-02 ENCOUNTER — Encounter (HOSPITAL_COMMUNITY): Payer: Self-pay | Admitting: Pharmacy Technician

## 2014-08-02 ENCOUNTER — Emergency Department (HOSPITAL_BASED_OUTPATIENT_CLINIC_OR_DEPARTMENT_OTHER)
Admission: EM | Admit: 2014-08-02 | Discharge: 2014-08-02 | Disposition: A | Discharge status: Home / Self Care | Payer: BC Managed Care – PPO | Attending: Emergency Medicine | Admitting: Emergency Medicine

## 2014-08-02 ENCOUNTER — Encounter (HOSPITAL_BASED_OUTPATIENT_CLINIC_OR_DEPARTMENT_OTHER): Payer: Self-pay | Admitting: Emergency Medicine

## 2014-08-02 DIAGNOSIS — Z87448 Personal history of other diseases of urinary system: Secondary | ICD-10-CM | POA: Diagnosis not present

## 2014-08-02 DIAGNOSIS — R1011 Right upper quadrant pain: Secondary | ICD-10-CM | POA: Diagnosis present

## 2014-08-02 DIAGNOSIS — Z8709 Personal history of other diseases of the respiratory system: Secondary | ICD-10-CM | POA: Insufficient documentation

## 2014-08-02 DIAGNOSIS — K8020 Calculus of gallbladder without cholecystitis without obstruction: Secondary | ICD-10-CM | POA: Diagnosis not present

## 2014-08-02 DIAGNOSIS — Z79899 Other long term (current) drug therapy: Secondary | ICD-10-CM | POA: Insufficient documentation

## 2014-08-02 DIAGNOSIS — Z8659 Personal history of other mental and behavioral disorders: Secondary | ICD-10-CM | POA: Insufficient documentation

## 2014-08-02 DIAGNOSIS — Z8619 Personal history of other infectious and parasitic diseases: Secondary | ICD-10-CM | POA: Diagnosis not present

## 2014-08-02 DIAGNOSIS — Z8719 Personal history of other diseases of the digestive system: Secondary | ICD-10-CM | POA: Diagnosis not present

## 2014-08-02 DIAGNOSIS — Z872 Personal history of diseases of the skin and subcutaneous tissue: Secondary | ICD-10-CM | POA: Diagnosis not present

## 2014-08-02 DIAGNOSIS — K8050 Calculus of bile duct without cholangitis or cholecystitis without obstruction: Secondary | ICD-10-CM

## 2014-08-02 DIAGNOSIS — Z3202 Encounter for pregnancy test, result negative: Secondary | ICD-10-CM | POA: Diagnosis not present

## 2014-08-02 DIAGNOSIS — E669 Obesity, unspecified: Secondary | ICD-10-CM | POA: Insufficient documentation

## 2014-08-02 DIAGNOSIS — Z91040 Latex allergy status: Secondary | ICD-10-CM | POA: Insufficient documentation

## 2014-08-02 HISTORY — DX: Calculus of gallbladder without cholecystitis without obstruction: K80.20

## 2014-08-02 LAB — CBC WITH DIFFERENTIAL/PLATELET
BASOS PCT: 0 % (ref 0–1)
Basophils Absolute: 0 10*3/uL (ref 0.0–0.1)
EOS ABS: 0.1 10*3/uL (ref 0.0–0.7)
Eosinophils Relative: 2 % (ref 0–5)
HCT: 42.6 % (ref 36.0–46.0)
HEMOGLOBIN: 14.9 g/dL (ref 12.0–15.0)
Lymphocytes Relative: 40 % (ref 12–46)
Lymphs Abs: 1.8 10*3/uL (ref 0.7–4.0)
MCH: 31.2 pg (ref 26.0–34.0)
MCHC: 35 g/dL (ref 30.0–36.0)
MCV: 89.3 fL (ref 78.0–100.0)
Monocytes Absolute: 0.4 10*3/uL (ref 0.1–1.0)
Monocytes Relative: 9 % (ref 3–12)
NEUTROS ABS: 2.3 10*3/uL (ref 1.7–7.7)
Neutrophils Relative %: 50 % (ref 43–77)
PLATELETS: 217 10*3/uL (ref 150–400)
RBC: 4.77 MIL/uL (ref 3.87–5.11)
RDW: 12 % (ref 11.5–15.5)
WBC: 4.6 10*3/uL (ref 4.0–10.5)

## 2014-08-02 LAB — URINALYSIS, ROUTINE W REFLEX MICROSCOPIC
GLUCOSE, UA: NEGATIVE mg/dL
Hgb urine dipstick: NEGATIVE
Ketones, ur: 15 mg/dL — AB
Nitrite: NEGATIVE
PH: 6 (ref 5.0–8.0)
Protein, ur: NEGATIVE mg/dL
SPECIFIC GRAVITY, URINE: 1.03 (ref 1.005–1.030)
Urobilinogen, UA: 1 mg/dL (ref 0.0–1.0)

## 2014-08-02 LAB — COMPREHENSIVE METABOLIC PANEL
ALK PHOS: 67 U/L (ref 39–117)
ALT: 75 U/L — AB (ref 0–35)
AST: 29 U/L (ref 0–37)
Albumin: 4.5 g/dL (ref 3.5–5.2)
Anion gap: 14 (ref 5–15)
BILIRUBIN TOTAL: 1.6 mg/dL — AB (ref 0.3–1.2)
BUN: 14 mg/dL (ref 6–23)
CHLORIDE: 101 meq/L (ref 96–112)
CO2: 24 mEq/L (ref 19–32)
Calcium: 10.7 mg/dL — ABNORMAL HIGH (ref 8.4–10.5)
Creatinine, Ser: 1 mg/dL (ref 0.50–1.10)
GFR calc Af Amer: >90 mL/min (ref >90)
GFR calc non Af Amer: 81 mL/min — ABNORMAL LOW (ref >90)
Glucose, Bld: 97 mg/dL (ref 70–99)
Potassium: 4.4 mEq/L (ref 3.7–5.3)
SODIUM: 139 meq/L (ref 137–147)
TOTAL PROTEIN: 7.6 g/dL (ref 6.0–8.3)

## 2014-08-02 LAB — PREGNANCY, URINE: Preg Test, Ur: NEGATIVE

## 2014-08-02 LAB — URINE MICROSCOPIC-ADD ON

## 2014-08-02 LAB — LIPASE, BLOOD: Lipase: 21 U/L (ref 11–59)

## 2014-08-02 MED ORDER — SODIUM CHLORIDE 0.9 % IV BOLUS (SEPSIS)
1000.0000 mL | Freq: Once | INTRAVENOUS | Status: AC
  Administered 2014-08-02: 1000 mL via INTRAVENOUS

## 2014-08-02 MED ORDER — ONDANSETRON HCL 4 MG/2ML IJ SOLN
4.0000 mg | Freq: Once | INTRAMUSCULAR | Status: AC
  Administered 2014-08-02: 4 mg via INTRAVENOUS
  Filled 2014-08-02: qty 2

## 2014-08-02 MED ORDER — RANITIDINE HCL 150 MG PO TABS: 150.0000 mg | ORAL_TABLET | Freq: Two times a day (BID) | ORAL | Status: DC

## 2014-08-02 MED ORDER — HYDROCODONE-ACETAMINOPHEN 5-325 MG PO TABS
2.0000 | ORAL_TABLET | ORAL | Status: DC | PRN
Start: 2014-08-02 — End: 2014-08-09

## 2014-08-02 MED ORDER — HYDROMORPHONE HCL PF 1 MG/ML IJ SOLN
1.0000 mg | Freq: Once | INTRAMUSCULAR | Status: AC
  Administered 2014-08-02: 1 mg via INTRAVENOUS
  Filled 2014-08-02: qty 1

## 2014-08-02 NOTE — ED Notes (Signed)
Awakened at 0430 this morning with RUQ pain radiating to back.  Scheduled for lap chole next Tuesday.

## 2014-08-02 NOTE — ED Provider Notes (Signed)
CSN: [id number]     Arrival date & time 08/02/14  0803 History   First MD Initiated Contact with Patient 08/02/14 [phone]     Chief Complaint  Patient presents with  . Abdominal Pain     (Consider location/radiation/quality/duration/timing/severity/associated sxs/prior Treatment) HPI Comments: Patient presents to the ER with complaints of right upper abdominal pain with radiation to the back. Patient reports associated nausea and vomiting. The pain woke her from sleep at 4:30 AM. There was severe initially, it is easing off now. Patient reports she was diagnosed with gallstones a month ago, is scheduled to have her gallbladder removed next week. She has not had any fever.  Patient is a 18 y.o. female presenting with abdominal pain.  Abdominal Pain Associated symptoms: nausea and vomiting     Past Medical History  Diagnosis Date  . Nickel allergy   . Chicken pox as a child  . Overactive bladder   . Vesico-ureteral reflux   . 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 Obesity 11/11/2011  . Social anxiety disorder 11/11/2011  . Contact dermatitis 06/04/2012  . Sinusitis 01/29/2013  . Anxiety and depression 11/11/2011  . Gallstones    Past Surgical History  Procedure Laterality Date  . Broken arm      left, repair of ulna and radius  . Tubes in ears  18 yr old   Family History  Problem Relation Age of Onset  . Diabetes Father     type 2  . Other Father     muscular spasm of the heart  . Hyperlipidemia Father   . Hyperlipidemia Maternal Grandmother   . Hypertension Maternal Grandmother   . Diabetes Maternal Grandmother     type 2  . Other Paternal Grandmother     arrythmia  . Diabetes Paternal Grandmother     type 2  . Diabetes Paternal Grandfather     type 2  . Hyperlipidemia Paternal Grandfather   . Other Paternal Grandfather     fluid around the heart  . Cancer Maternal Grandfather     laryngeal, alcohol and tobacco   History  Substance Use Topics  . Smoking status:  Never Smoker   . Smokeless tobacco: Never Used  . Alcohol Use: No   OB History   Grav Para Term Preterm Abortions TAB SAB Ect Mult Living                 Review of Systems  Gastrointestinal: Positive for nausea, vomiting and abdominal pain.  All other systems reviewed and are negative.     Allergies  Latex; Cefprozil; Nickel; and Sulfa antibiotics  Home Medications   Prior to Admission medications   Medication Sig Start Date End Date Taking? Authorizing Provider  cholecalciferol (VITAMIN D) 1000 UNITS tablet Take 1,000 Units by mouth daily.    Historical Provider, MD  norethindrone-ethinyl estradiol (JUNEL FE,GILDESS FE,LOESTRIN FE) 1-20 MG-MCG tablet Take 1 tablet by mouth daily.    Historical Provider, MD  phentermine 37.5 MG capsule Take 37.5 mg by mouth every morning.    Historical Provider, MD   BP 112/75  Pulse 81  Temp(Src) 97.7 F (36.5 C) (Oral)  Resp 20  Ht 5\' 4"  (1.626 m)  Wt 161 lb (73.029 kg)  BMI 27.62 kg/m2  SpO2 100% Physical Exam  Constitutional: She is oriented to person, place, and time. She appears well-developed and well-nourished. No distress.  HENT:  Head: Normocephalic and atraumatic.  Right Ear: Hearing normal.  Left Ear: Hearing normal.  Nose:  Nose normal.  Mouth/Throat: Oropharynx is clear and moist and mucous membranes are normal.  Eyes: Conjunctivae and EOM are normal. Pupils are equal, round, and reactive to light.  Neck: Normal range of motion. Neck supple.  Cardiovascular: Regular rhythm, S1 normal and S2 normal.  Exam reveals no gallop and no friction rub.   No murmur heard. Pulmonary/Chest: Effort normal and breath sounds normal. No respiratory distress. She exhibits no tenderness.  Abdominal: Soft. Normal appearance and bowel sounds are normal. There is no hepatosplenomegaly. There is tenderness in the right upper quadrant. There is no rebound, no guarding, no tenderness at McBurney's point and negative Murphy's sign. No hernia.   Musculoskeletal: Normal range of motion.  Neurological: She is alert and oriented to person, place, and time. She has normal strength. No cranial nerve deficit or sensory deficit. Coordination normal. GCS eye subscore is 4. GCS verbal subscore is 5. GCS motor subscore is 6.  Skin: Skin is warm, dry and intact. No rash noted. No cyanosis.  Psychiatric: She has a normal mood and affect. Her speech is normal and behavior is normal. Thought content normal.    ED Course  Procedures (including critical care time) Labs Review Labs Reviewed  CBC WITH DIFFERENTIAL  COMPREHENSIVE METABOLIC PANEL  URINALYSIS, ROUTINE W REFLEX MICROSCOPIC  LIPASE, BLOOD    Imaging Review No results found.   EKG Interpretation None      MDM   Final diagnoses:  None   biliary colic  Patient presents to the ER for evaluation of right upper abdominal pain with radiation to the back. She is accompanying nausea and vomiting. Symptoms are consistent with biliary colic. She was previously diagnosed with gallstones, scheduled for cholecystectomy in 1 week. Patient's pain improving at time of arrival. She does not have any Murphy sign on arrival to the ER. Patient administer IV fluids, antibiotic, analgesia. Patient doing better upon recheck. LFTs are actually improved from her last, only has a very slight ALT and T. bili elevation. As there is no further pain, no need for urgent intervention. Until followup next week for cholecystitis. We'll treat analgesia, ranitidine. Return for recurrent symptoms.    Gilda Crease, MD 08/02/14 [phone]

## 2014-08-02 NOTE — ED Notes (Signed)
MD at bedside. 

## 2014-08-02 NOTE — Discharge Instructions (Signed)
Biliary Colic  °Biliary colic is a steady or irregular pain in the upper abdomen. It is usually under the right side of the rib cage. It happens when gallstones interfere with the normal flow of bile from the gallbladder. Bile is a liquid that helps to digest fats. Bile is made in the liver and stored in the gallbladder. When you eat a meal, bile passes from the gallbladder through the cystic duct and the common bile duct into the small intestine. There, it mixes with partially digested food. If a gallstone blocks either of these ducts, the normal flow of bile is blocked. The muscle cells in the bile duct contract forcefully to try to move the stone. This causes the pain of biliary colic.  °SYMPTOMS  °· A person with biliary colic usually complains of pain in the upper abdomen. This pain can be: °¨ In the center of the upper abdomen just below the breastbone. °¨ In the upper-right part of the abdomen, near the gallbladder and liver. °¨ Spread back toward the right shoulder blade. °· Nausea and vomiting. °· The pain usually occurs after eating. °· Biliary colic is usually triggered by the digestive system's demand for bile. The demand for bile is high after fatty meals. Symptoms can also occur when a person who has been fasting suddenly eats a very large meal. Most episodes of biliary colic pass after 1 to 5 hours. After the most intense pain passes, your abdomen may continue to ache mildly for about 24 hours. °DIAGNOSIS  °After you describe your symptoms, your caregiver will perform a physical exam. He or she will pay attention to the upper right portion of your belly (abdomen). This is the area of your liver and gallbladder. An ultrasound will help your caregiver look for gallstones. Specialized scans of the gallbladder may also be done. Blood tests may be done, especially if you have fever or if your pain persists. °PREVENTION  °Biliary colic can be prevented by controlling the risk factors for gallstones. Some of  these risk factors, such as heredity, increasing age, and pregnancy are a normal part of life. Obesity and a high-fat diet are risk factors you can change through a healthy lifestyle. Women going through menopause who take hormone replacement therapy (estrogen) are also more likely to develop biliary colic. °TREATMENT  °· Pain medication may be prescribed. °· You may be encouraged to eat a fat-free diet. °· If the first episode of biliary colic is severe, or episodes of colic keep retuning, surgery to remove the gallbladder (cholecystectomy) is usually recommended. This procedure can be done through small incisions using an instrument called a laparoscope. The procedure often requires a brief stay in the hospital. Some people can leave the hospital the same day. It is the most widely used treatment in people troubled by painful gallstones. It is effective and safe, with no complications in more than 90% of cases. °· If surgery cannot be done, medication that dissolves gallstones may be used. This medication is expensive and can take months or years to work. Only small stones will dissolve. °· Rarely, medication to dissolve gallstones is combined with a procedure called shock-wave lithotripsy. This procedure uses carefully aimed shock waves to break up gallstones. In many people treated with this procedure, gallstones form again within a few years. °PROGNOSIS  °If gallstones block your cystic duct or common bile duct, you are at risk for repeated episodes of biliary colic. There is also a 25% chance that you will develop   a gallbladder infection(acute cholecystitis), or some other complication of gallstones within 10 to 20 years. If you have surgery, schedule it at a time that is convenient for you and at a time when you are not sick. °HOME CARE INSTRUCTIONS  °· Drink plenty of clear fluids. °· Avoid fatty, greasy or fried foods, or any foods that make your pain worse. °· Take medications as directed. °SEEK MEDICAL  CARE IF:  °· You develop a fever over 100.5° F (38.1° C). °· Your pain gets worse over time. °· You develop nausea that prevents you from eating and drinking. °· You develop vomiting. °SEEK IMMEDIATE MEDICAL CARE IF:  °· You have continuous or severe belly (abdominal) pain which is not relieved with medications. °· You develop nausea and vomiting which is not relieved with medications. °· You have symptoms of biliary colic and you suddenly develop a fever and shaking chills. This may signal cholecystitis. Call your caregiver immediately. °· You develop a yellow color to your skin or the white part of your eyes (jaundice). °Document Released: 05/12/2006 Document Revised: 03/02/2012 Document Reviewed: 07/21/2008 °Exit[employer]® Patient Information ©2015 [employer]. This information is not intended to replace advice given to you by your health care provider. Make sure you discuss any questions you have with your health care provider. ° °

## 2014-08-05 ENCOUNTER — Encounter (HOSPITAL_COMMUNITY)
Admission: RE | Admit: 2014-08-05 | Discharge: 2014-08-05 | Disposition: A | Discharge status: Home / Self Care | Payer: BC Managed Care – PPO | Source: Ambulatory Visit | Attending: General Surgery | Admitting: General Surgery

## 2014-08-05 ENCOUNTER — Encounter (HOSPITAL_COMMUNITY): Payer: Self-pay

## 2014-08-05 DIAGNOSIS — Z01812 Encounter for preprocedural laboratory examination: Secondary | ICD-10-CM | POA: Insufficient documentation

## 2014-08-05 HISTORY — DX: Gastro-esophageal reflux disease without esophagitis: K21.9

## 2014-08-05 HISTORY — DX: Nausea with vomiting, unspecified: R11.2

## 2014-08-05 HISTORY — DX: Other specified postprocedural states: Z98.890

## 2014-08-05 LAB — CBC
HEMATOCRIT: 40.8 % (ref 36.0–46.0)
Hemoglobin: 14.3 g/dL (ref 12.0–15.0)
MCH: 30.8 pg (ref 26.0–34.0)
MCHC: 35 g/dL (ref 30.0–36.0)
MCV: 87.9 fL (ref 78.0–100.0)
PLATELETS: 206 10*3/uL (ref 150–400)
RBC: 4.64 MIL/uL (ref 3.87–5.11)
RDW: 12.2 % (ref 11.5–15.5)
WBC: 4.6 10*3/uL (ref 4.0–10.5)

## 2014-08-05 LAB — COMPREHENSIVE METABOLIC PANEL
ALBUMIN: 4.2 g/dL (ref 3.5–5.2)
ALT: 109 U/L — AB (ref 0–35)
AST: 38 U/L — ABNORMAL HIGH (ref 0–37)
Alkaline Phosphatase: 71 U/L (ref 39–117)
Anion gap: 12 (ref 5–15)
BUN: 11 mg/dL (ref 6–23)
CALCIUM: 10.1 mg/dL (ref 8.4–10.5)
CO2: 24 mEq/L (ref 19–32)
Chloride: 102 mEq/L (ref 96–112)
Creatinine, Ser: 0.94 mg/dL (ref 0.50–1.10)
GFR calc non Af Amer: 87 mL/min — ABNORMAL LOW (ref >90)
GLUCOSE: 95 mg/dL (ref 70–99)
Potassium: 4.3 mEq/L (ref 3.7–5.3)
SODIUM: 138 meq/L (ref 137–147)
TOTAL PROTEIN: 7.4 g/dL (ref 6.0–8.3)
Total Bilirubin: 1.8 mg/dL — ABNORMAL HIGH (ref 0.3–1.2)

## 2014-08-05 LAB — HCG, SERUM, QUALITATIVE: Preg, Serum: NEGATIVE

## 2014-08-05 MED ORDER — CHLORHEXIDINE GLUCONATE 4 % EX LIQD
1.0000 "application " | Freq: Once | CUTANEOUS | Status: DC
  Filled 2014-08-05: qty 15

## 2014-08-05 MED ORDER — CHLORHEXIDINE GLUCONATE 4 % EX LIQD
1.0000 | Freq: Once | CUTANEOUS | Status: DC
Start: 2014-08-05 — End: 2014-08-06
  Filled 2014-08-05: qty 15

## 2014-08-05 NOTE — Patient Instructions (Signed)
[facility]  ON:   Tuesday  8/18  REPORT TO  SHORT STAY CENTER AT:  7:00 AM   PLEASE [facility]E [facility]TRANCE AND FOLLOW SIGNS TO SHORT STAY CENTER.  DO NOT EAT OR DRINK ANYTHING AFTER MIDNIGHT THE NIGHT BEFORE YOUR SURGERY.  YOU MAY BRUSH YOUR TEETH, RINSE OUT YOUR MOUTH--BUT NO WATER, NO FOOD, NO CHEWING GUM, NO MINTS, NO CANDIES, NO CHEWING TOBACCO.  PLEASE TAKE THE FOLLOWING MEDICATIONS THE AM OF YOUR SURGERY WITH A FEW SIPS OF WATER:  ZANTAC  DO NOT BRING VALUABLES, MONEY, CREDIT CARDS.  DO NOT WEAR JEWELRY, MAKE-UP, NAIL POLISH AND NO METAL PINS OR CLIPS IN YOUR HAIR. CONTACT LENS, DENTURES / PARTIALS, GLASSES SHOULD NOT BE WORN TO SURGERY AND IN MOST CASES-HEARING AIDS WILL NEED TO BE REMOVED.  BRING YOUR GLASSES CASE, ANY EQUIPMENT NEEDED FOR YOUR CONTACT LENS. FOR PATIENTS ADMITTED TO THE HOSPITAL--CHECK OUT TIME THE DAY OF DISCHARGE IS 11:00 AM.  ALL INPATIENT ROOMS ARE PRIVATE - WITH BATHROOM, TELEPHONE, TELEVISION AND WIFI INTERNET.  IF YOU ARE BEING DISCHARGED THE SAME DAY OF YOUR SURGERY--YOU CAN NOT DRIVE YOURSELF HOME--AND SHOULD NOT GO HOME ALONE BY TAXI OR BUS.  NO DRIVING OR OPERATING MACHINERY, OR MAKING LEGAL DECISIONS FOR 24 HOURS FOLLOWING ANESTHESIA / PAIN MEDICATIONS.  PLEASE MAKE ARRANGEMENTS FOR SOMEONE TO BE WITH YOU AT HOME THE FIRST 24 HOURS AFTER SURGERY. RESPONSIBLE DRIVER'S NAME / PHONE   PARENTS WILL BE WITH HER                                                    PLEASE READ OVER ANY  FACT SHEETS THAT YOU WERE GIVEN: MRSA INFORMATION, BLOOD TRANSFUSION INFORMATION, INCENTIVE SPIROMETER INFORMATION.  PLEASE BE AWARE THAT YOU MAY NEED ADDITIONAL BLOOD DRAWN DAY OF YOUR SURGERY  _______________________________________________________________________   [facility] - Preparing [facility] Before surgery, you can play an important role.  Because skin is not sterile, your skin needs to be as free of  germs as possible.  You can reduce the number of germs on your skin by washing with CHG (chlorahexidine gluconate) soap before surgery.  CHG is an antiseptic cleaner which kills germs and bonds with the skin to continue killing germs even after washing. Please DO NOT use if you have an allergy to CHG or antibacterial soaps.  If your skin becomes reddened/irritated stop using the CHG and inform your nurse when you arrive at Short Stay. Do not shave (including legs and underarms) for at least 48 hours prior to the first CHG shower.  You may shave your face/neck. Please follow these instructions carefully:  1.  Shower with CHG Soap the night before surgery and the  morning of Surgery.  2.  If you choose to wash your hair, wash your hair first as usual with your  normal  shampoo.  3.  After you shampoo, rinse your hair and body thoroughly to remove the  shampoo.                           4.  Use CHG as you would any other liquid soap.  You can apply chg directly  to the skin and wash  Gently with a scrungie or clean washcloth.  5.  Apply the CHG Soap to your body ONLY FROM THE NECK DOWN.   Do not use on face/ open                           Wound or open sores. Avoid contact with eyes, ears mouth and genitals (private parts).                       Wash face,  Genitals (private parts) with your normal soap.             6.  Wash thoroughly, paying special attention to the area where your surgery  will be performed.  7.  Thoroughly rinse your body with warm water from the neck down.  8.  DO NOT shower/wash with your normal soap after using and rinsing off  the CHG Soap.                9.  Pat yourself dry with a clean towel.            10.  Wear clean pajamas.            11.  Place clean sheets on your bed the night of your first shower and do not  sleep with pets. Day of Surgery : Do not apply any lotions/deodorants the morning of surgery.  Please wear clean clothes to the  hospital/surgery center.  FAILURE TO FOLLOW THESE INSTRUCTIONS MAY RESULT IN THE CANCELLATION OF YOUR SURGERY PATIENT SIGNATURE_________________________________  NURSE SIGNATURE__________________________________  ________________________________________________________________________

## 2014-08-05 NOTE — Pre-Procedure Instructions (Addendum)
EKG AND CXR NOT NEEDED PREOP PER ANESTHESIOLOGIST'S GUIDELINES. PT ADVISED THAT ANESTHESIOLOGIST WANT PHENTERAMINE STOPPED 3 DAYS BEFORE SURGERY.

## 2014-08-05 NOTE — Pre-Procedure Instructions (Signed)
PREOP CMET REPORT FAXED TO DR. [facility]awana [facility] FOR REVIEW OF ABNORMALS

## 2014-08-06 DIAGNOSIS — K8010 Calculus of gallbladder with chronic cholecystitis without obstruction: Secondary | ICD-10-CM

## 2014-08-09 ENCOUNTER — Ambulatory Visit (HOSPITAL_COMMUNITY): Payer: BC Managed Care – PPO | Admitting: Anesthesiology

## 2014-08-09 ENCOUNTER — Encounter (HOSPITAL_COMMUNITY)
Admission: RE | Disposition: A | Discharge status: Home / Self Care | Payer: Self-pay | Source: Ambulatory Visit | Attending: General Surgery

## 2014-08-09 ENCOUNTER — Ambulatory Visit (HOSPITAL_COMMUNITY)
Admission: RE | Admit: 2014-08-09 | Discharge: 2014-08-09 | Disposition: A | Discharge status: Home / Self Care | Payer: BC Managed Care – PPO | Source: Ambulatory Visit | Attending: General Surgery | Admitting: General Surgery

## 2014-08-09 ENCOUNTER — Encounter (HOSPITAL_COMMUNITY): Payer: Self-pay

## 2014-08-09 ENCOUNTER — Encounter (HOSPITAL_COMMUNITY): Payer: BC Managed Care – PPO | Admitting: Anesthesiology

## 2014-08-09 ENCOUNTER — Ambulatory Visit (HOSPITAL_COMMUNITY): Payer: BC Managed Care – PPO

## 2014-08-09 DIAGNOSIS — F341 Dysthymic disorder: Secondary | ICD-10-CM | POA: Diagnosis not present

## 2014-08-09 DIAGNOSIS — L74519 Primary focal hyperhidrosis, unspecified: Secondary | ICD-10-CM | POA: Insufficient documentation

## 2014-08-09 DIAGNOSIS — F411 Generalized anxiety disorder: Secondary | ICD-10-CM | POA: Diagnosis not present

## 2014-08-09 DIAGNOSIS — N1370 Vesicoureteral-reflux, unspecified: Secondary | ICD-10-CM | POA: Insufficient documentation

## 2014-08-09 DIAGNOSIS — K8010 Calculus of gallbladder with chronic cholecystitis without obstruction: Secondary | ICD-10-CM | POA: Insufficient documentation

## 2014-08-09 DIAGNOSIS — Z91040 Latex allergy status: Secondary | ICD-10-CM | POA: Insufficient documentation

## 2014-08-09 DIAGNOSIS — Z888 Allergy status to other drugs, medicaments and biological substances status: Secondary | ICD-10-CM | POA: Diagnosis not present

## 2014-08-09 DIAGNOSIS — Z882 Allergy status to sulfonamides status: Secondary | ICD-10-CM | POA: Diagnosis not present

## 2014-08-09 DIAGNOSIS — Z79899 Other long term (current) drug therapy: Secondary | ICD-10-CM | POA: Insufficient documentation

## 2014-08-09 DIAGNOSIS — N318 Other neuromuscular dysfunction of bladder: Secondary | ICD-10-CM | POA: Diagnosis not present

## 2014-08-09 DIAGNOSIS — K8020 Calculus of gallbladder without cholecystitis without obstruction: Secondary | ICD-10-CM | POA: Diagnosis present

## 2014-08-09 HISTORY — PX: CHOLECYSTECTOMY: SHX55

## 2014-08-09 SURGERY — LAPAROSCOPIC CHOLECYSTECTOMY WITH INTRAOPERATIVE CHOLANGIOGRAM
Anesthesia: General | Site: Abdomen

## 2014-08-09 MED ORDER — FENTANYL CITRATE 0.05 MG/ML IJ SOLN
INTRAMUSCULAR | Status: AC
  Filled 2014-08-09: qty 5

## 2014-08-09 MED ORDER — CIPROFLOXACIN IN D5W 400 MG/200ML IV SOLN
400.0000 mg | INTRAVENOUS | Status: AC
  Administered 2014-08-09: 400 mg via INTRAVENOUS

## 2014-08-09 MED ORDER — PROMETHAZINE HCL 25 MG/ML IJ SOLN: 6.2500 mg | INTRAMUSCULAR | Status: DC | PRN

## 2014-08-09 MED ORDER — KETOROLAC TROMETHAMINE 30 MG/ML IJ SOLN
INTRAMUSCULAR | Status: DC | PRN
  Administered 2014-08-09: 30 mg via INTRAVENOUS

## 2014-08-09 MED ORDER — DEXAMETHASONE SODIUM PHOSPHATE 10 MG/ML IJ SOLN
INTRAMUSCULAR | Status: DC | PRN
  Administered 2014-08-09: 10 mg via INTRAVENOUS

## 2014-08-09 MED ORDER — DEXAMETHASONE SODIUM PHOSPHATE 10 MG/ML IJ SOLN
INTRAMUSCULAR | Status: AC
Start: 2014-08-09 — End: 2014-08-09
  Filled 2014-08-09: qty 1

## 2014-08-09 MED ORDER — HYDROMORPHONE HCL PF 1 MG/ML IJ SOLN: 0.2500 mg | INTRAMUSCULAR | Status: DC | PRN

## 2014-08-09 MED ORDER — ACETAMINOPHEN 325 MG PO TABS: 650.0000 mg | ORAL_TABLET | ORAL | Status: DC | PRN

## 2014-08-09 MED ORDER — OXYCODONE HCL 5 MG PO TABS: 5.0000 mg | ORAL_TABLET | Freq: Once | ORAL | Status: DC | PRN

## 2014-08-09 MED ORDER — NEOSTIGMINE METHYLSULFATE 10 MG/10ML IV SOLN
INTRAVENOUS | Status: AC
  Filled 2014-08-09: qty 1

## 2014-08-09 MED ORDER — ROCURONIUM BROMIDE 100 MG/10ML IV SOLN
INTRAVENOUS | Status: DC | PRN
  Administered 2014-08-09: 15 mg via INTRAVENOUS
  Administered 2014-08-09: 5 mg via INTRAVENOUS

## 2014-08-09 MED ORDER — CIPROFLOXACIN IN D5W 400 MG/200ML IV SOLN
INTRAVENOUS | Status: AC
  Filled 2014-08-09: qty 200

## 2014-08-09 MED ORDER — LIDOCAINE HCL (CARDIAC) 20 MG/ML IV SOLN
INTRAVENOUS | Status: DC | PRN
  Administered 2014-08-09: 50 mg via INTRAVENOUS

## 2014-08-09 MED ORDER — BUPIVACAINE-EPINEPHRINE 0.25% -1:200000 IJ SOLN
INTRAMUSCULAR | Status: DC | PRN
  Administered 2014-08-09: 30 mL

## 2014-08-09 MED ORDER — PROPOFOL 10 MG/ML IV BOLUS
INTRAVENOUS | Status: AC
  Filled 2014-08-09: qty 20

## 2014-08-09 MED ORDER — NEOSTIGMINE METHYLSULFATE 10 MG/10ML IV SOLN
INTRAVENOUS | Status: DC | PRN
  Administered 2014-08-09: 4 mg via INTRAVENOUS

## 2014-08-09 MED ORDER — LIDOCAINE HCL (CARDIAC) 20 MG/ML IV SOLN
INTRAVENOUS | Status: AC
  Filled 2014-08-09: qty 5

## 2014-08-09 MED ORDER — GLYCOPYRROLATE 0.2 MG/ML IJ SOLN
INTRAMUSCULAR | Status: AC
  Filled 2014-08-09: qty 3

## 2014-08-09 MED ORDER — ROCURONIUM BROMIDE 100 MG/10ML IV SOLN
INTRAVENOUS | Status: AC
  Filled 2014-08-09: qty 1

## 2014-08-09 MED ORDER — KETOROLAC TROMETHAMINE 15 MG/ML IJ SOLN: 15.0000 mg | Freq: Four times a day (QID) | INTRAMUSCULAR | Status: DC

## 2014-08-09 MED ORDER — PROPOFOL 10 MG/ML IV BOLUS
INTRAVENOUS | Status: DC | PRN
  Administered 2014-08-09: 180 mg via INTRAVENOUS

## 2014-08-09 MED ORDER — MIDAZOLAM HCL 5 MG/5ML IJ SOLN
INTRAMUSCULAR | Status: DC | PRN
  Administered 2014-08-09: 2 mg via INTRAVENOUS

## 2014-08-09 MED ORDER — LACTATED RINGERS IV SOLN
INTRAVENOUS | Status: DC | PRN
  Administered 2014-08-09: 1000 mL

## 2014-08-09 MED ORDER — GLYCOPYRROLATE 0.2 MG/ML IJ SOLN
INTRAMUSCULAR | Status: DC | PRN
  Administered 2014-08-09: 0.6 mg via INTRAVENOUS

## 2014-08-09 MED ORDER — MORPHINE SULFATE 10 MG/ML IJ SOLN: 1.0000 mg | INTRAMUSCULAR | Status: DC | PRN

## 2014-08-09 MED ORDER — ACETAMINOPHEN 650 MG RE SUPP
650.0000 mg | RECTAL | Status: DC | PRN
  Filled 2014-08-09: qty 1

## 2014-08-09 MED ORDER — ONDANSETRON HCL 4 MG/2ML IJ SOLN
INTRAMUSCULAR | Status: AC
  Filled 2014-08-09: qty 2

## 2014-08-09 MED ORDER — IOHEXOL 300 MG/ML  SOLN
INTRAMUSCULAR | Status: DC | PRN
  Administered 2014-08-09: 7.5 mL via INTRAVENOUS

## 2014-08-09 MED ORDER — MEPERIDINE HCL 50 MG/ML IJ SOLN: 6.2500 mg | INTRAMUSCULAR | Status: DC | PRN

## 2014-08-09 MED ORDER — MIDAZOLAM HCL 2 MG/2ML IJ SOLN
INTRAMUSCULAR | Status: AC
  Filled 2014-08-09: qty 2

## 2014-08-09 MED ORDER — LACTATED RINGERS IV SOLN
INTRAVENOUS | Status: DC
  Administered 2014-08-09: 1000 mL via INTRAVENOUS
  Administered 2014-08-09 (×2): via INTRAVENOUS

## 2014-08-09 MED ORDER — SUCCINYLCHOLINE CHLORIDE 20 MG/ML IJ SOLN
INTRAMUSCULAR | Status: DC | PRN
  Administered 2014-08-09: 100 mg via INTRAVENOUS

## 2014-08-09 MED ORDER — KETOROLAC TROMETHAMINE 30 MG/ML IJ SOLN
INTRAMUSCULAR | Status: AC
  Filled 2014-08-09: qty 1

## 2014-08-09 MED ORDER — SODIUM CHLORIDE 0.9 % IJ SOLN: 3.0000 mL | INTRAMUSCULAR | Status: DC | PRN

## 2014-08-09 MED ORDER — OXYCODONE HCL 5 MG/5ML PO SOLN
5.0000 mg | Freq: Once | ORAL | Status: DC | PRN
  Filled 2014-08-09: qty 5

## 2014-08-09 MED ORDER — FENTANYL CITRATE 0.05 MG/ML IJ SOLN
INTRAMUSCULAR | Status: DC | PRN
  Administered 2014-08-09: 50 ug via INTRAVENOUS
  Administered 2014-08-09: 100 ug via INTRAVENOUS
  Administered 2014-08-09 (×2): 50 ug via INTRAVENOUS

## 2014-08-09 MED ORDER — BUPIVACAINE-EPINEPHRINE (PF) 0.25% -1:200000 IJ SOLN
INTRAMUSCULAR | Status: AC
  Filled 2014-08-09: qty 30

## 2014-08-09 MED ORDER — SODIUM CHLORIDE 0.9 % IV SOLN: 250.0000 mL | INTRAVENOUS | Status: DC | PRN

## 2014-08-09 MED ORDER — OXYCODONE HCL 5 MG PO TABS: 5.0000 mg | ORAL_TABLET | ORAL | Status: DC | PRN

## 2014-08-09 MED ORDER — OXYCODONE HCL 5 MG PO CAPS: 5.0000 mg | ORAL_CAPSULE | ORAL | Status: DC | PRN

## 2014-08-09 MED ORDER — 0.9 % SODIUM CHLORIDE (POUR BTL) OPTIME
TOPICAL | Status: DC | PRN
  Administered 2014-08-09: 1000 mL

## 2014-08-09 MED ORDER — ONDANSETRON HCL 4 MG/2ML IJ SOLN
INTRAMUSCULAR | Status: DC | PRN
  Administered 2014-08-09: 4 mg via INTRAVENOUS

## 2014-08-09 MED ORDER — SODIUM CHLORIDE 0.9 % IJ SOLN: 3.0000 mL | Freq: Two times a day (BID) | INTRAMUSCULAR | Status: DC

## 2014-08-09 SURGICAL SUPPLY — 41 items
APPLIER CLIP 5 13 M/L LIGAMAX5 (MISCELLANEOUS) ×2
APPLIER CLIP ROT 10 11.4 M/L (STAPLE)
CANISTER SUCTION 2500CC (MISCELLANEOUS) IMPLANT
CHLORAPREP W/TINT 26ML (MISCELLANEOUS) ×2 IMPLANT
CLIP APPLIE 5 13 M/L LIGAMAX5 (MISCELLANEOUS) ×1 IMPLANT
CLIP APPLIE ROT 10 11.4 M/L (STAPLE) IMPLANT
COVER MAYO STAND STRL (DRAPES) ×2 IMPLANT
COVER SURGICAL LIGHT HANDLE (MISCELLANEOUS) IMPLANT
DECANTER SPIKE VIAL GLASS SM (MISCELLANEOUS) IMPLANT
DERMABOND ADVANCED (GAUZE/BANDAGES/DRESSINGS) ×1
DERMABOND ADVANCED .7 DNX12 (GAUZE/BANDAGES/DRESSINGS) ×1 IMPLANT
DRAPE C-ARM 42X120 X-RAY (DRAPES) ×2 IMPLANT
DRAPE LAPAROSCOPIC ABDOMINAL (DRAPES) ×2 IMPLANT
DRAPE UTILITY XL STRL (DRAPES) ×2 IMPLANT
DRSG TEGADERM 2-3/8X2-3/4 SM (GAUZE/BANDAGES/DRESSINGS) IMPLANT
ELECT REM PT RETURN 9FT ADLT (ELECTROSURGICAL) ×2
ELECTRODE REM PT RTRN 9FT ADLT (ELECTROSURGICAL) ×1 IMPLANT
GLOVE BIO SURGEON STRL SZ7.5 (GLOVE) IMPLANT
GLOVE BIOGEL M STRL SZ7.5 (GLOVE) ×2 IMPLANT
GLOVE BIOGEL PI IND STRL 6.5 (GLOVE) ×3 IMPLANT
GLOVE BIOGEL PI IND STRL 7.0 (GLOVE) IMPLANT
GLOVE BIOGEL PI INDICATOR 6.5 (GLOVE) ×3
GLOVE BIOGEL PI INDICATOR 7.0 (GLOVE)
GLOVE INDICATOR 8.0 STRL GRN (GLOVE) IMPLANT
GLOVE SURG SS PI 6.5 STRL IVOR (GLOVE) ×4 IMPLANT
GOWN STRL REUS W/TWL LRG LVL3 (GOWN DISPOSABLE) ×4 IMPLANT
GOWN STRL REUS W/TWL XL LVL3 (GOWN DISPOSABLE) ×2 IMPLANT
KIT BASIN OR (CUSTOM PROCEDURE TRAY) ×2 IMPLANT
NS IRRIG 1000ML POUR BTL (IV SOLUTION) ×2 IMPLANT
POUCH SPECIMEN RETRIEVAL 10MM (ENDOMECHANICALS) ×2 IMPLANT
SET CHOLANGIOGRAPH MIX (MISCELLANEOUS) ×2 IMPLANT
SET IRRIG TUBING LAPAROSCOPIC (IRRIGATION / IRRIGATOR) ×2 IMPLANT
SOLUTION ANTI FOG 6CC (MISCELLANEOUS) ×2 IMPLANT
STRIP CLOSURE SKIN 1/2X4 (GAUZE/BANDAGES/DRESSINGS) IMPLANT
SUT MNCRL AB 4-0 PS2 18 (SUTURE) ×2 IMPLANT
SUT VICRYL 0 UR6 27IN ABS (SUTURE) ×2 IMPLANT
TOWEL OR 17X26 10 PK STRL BLUE (TOWEL DISPOSABLE) ×2 IMPLANT
TRAY LAP CHOLE (CUSTOM PROCEDURE TRAY) ×2 IMPLANT
TROCAR BLADELESS OPT 5 75 (ENDOMECHANICALS) ×6 IMPLANT
TROCAR XCEL BLUNT TIP 100MML (ENDOMECHANICALS) ×2 IMPLANT
TUBING INSUFFLATION 10FT LAP (TUBING) ×2 IMPLANT

## 2014-08-09 NOTE — Op Note (Signed)
IMUNIQUE TACHENY [id number] September 05, 1996 08/09/2014  Laparoscopic Cholecystectomy with IOC Procedure Note  Indications: This patient presents with symptomatic gallbladder disease and will undergo laparoscopic cholecystectomy. We discussed the possibility that cholecystectomy may not resolve her elevation of her LFTS and may need additional workup.   Pre-operative Diagnosis: symptomatic cholelithiasis  Post-operative Diagnosis: Same  Surgeon: Atilano Ina   Assistants: none  Anesthesia: General endotracheal anesthesia  ASA Class: 1  Procedure Details  The patient was seen again in the Holding Room. The risks, benefits, complications, treatment options, and expected outcomes were discussed with the patient. The possibilities of reaction to medication, pulmonary aspiration, perforation of viscus, bleeding, recurrent infection, finding a normal gallbladder, the need for additional procedures, failure to diagnose a condition, the possible need to convert to an open procedure, and creating a complication requiring transfusion or operation were discussed with the patient. The likelihood of improving the patient's symptoms with return to their baseline status is good.  The patient and/or family concurred with the proposed plan, giving informed consent. The site of surgery properly noted. The patient was taken to Operating Room, identified as Robin Suarez and the procedure verified as Laparoscopic Cholecystectomy with Intraoperative Cholangiogram. A Time Out was held and the above information confirmed. Antibiotic prophylaxis was administered.   Prior to the induction of general anesthesia, antibiotic prophylaxis was administered. General endotracheal anesthesia was then administered and tolerated well. After the induction, the abdomen was prepped with Chloraprep and draped in the sterile fashion. The patient was positioned in the supine position.  Local anesthetic agent was injected into the  skin near the umbilicus and an incision made. We dissected down to the abdominal fascia with blunt dissection.  The fascia was incised vertically and we entered the peritoneal cavity bluntly.  A pursestring suture of 0-Vicryl was placed around the fascial opening.  The Hasson cannula was inserted and secured with the stay suture.  Pneumoperitoneum was then created with CO2 and tolerated well without any adverse changes in the patient's vital signs. An 5-mm port was placed in the subxiphoid position.  Two 5-mm ports were placed in the right upper quadrant. All skin incisions were infiltrated with a local anesthetic agent before making the incision and placing the trocars.   We positioned the patient in reverse Trendelenburg, tilted slightly to the patient's left.  The gallbladder was identified, the fundus grasped and retracted cephalad. Adhesions were lysed bluntly and with the electrocautery where indicated, taking care not to injure any adjacent organs or viscus. The infundibulum was grasped and retracted laterally, exposing the peritoneum overlying the triangle of Calot. This was then divided and exposed in a blunt fashion. A critical view of the cystic duct and cystic artery was obtained.  The cystic duct was clearly identified and bluntly dissected circumferentially. The cystic duct was ligated with a clip distally.   An incision was made in the cystic duct and the [facility] cholangiogram catheter introduced. The catheter was secured using a clip. A cholangiogram was then obtained which showed good visualization of the distal and proximal biliary tree with no sign of filling defects or obstruction.  Contrast flowed easily into the duodenum. The catheter was then removed.   The cystic duct was then ligated with clips and divided. The cystic artery which had been identified and dissected free was ligated with clips and divided as well.   The gallbladder was dissected from the liver bed in retrograde fashion with  the electrocautery. The gallbladder was removed and  placed in an Endocatch sac.  The gallbladder and Endocatch sac were then removed through the umbilical port site. The liver bed was irrigated and inspected. Hemostasis was achieved with the electrocautery. Copious irrigation was utilized and was repeatedly aspirated until clear.  The pursestring suture was used to close the umbilical fascia.  An additional interrupted 0-vicryl suture was placed at the umbilical closure.   We again inspected the right upper quadrant for hemostasis.  The umbilical closure was inspected and there was no air leak and nothing trapped within the closure. Pneumoperitoneum was released as we removed the trocars.  4-0 Monocryl was used to close the skin.   Dermabond was applied. The patient was then extubated and brought to the recovery room in stable condition. Instrument, sponge, and needle counts were correct at closure and at the conclusion of the case.   Findings: +critical view  Estimated Blood Loss: Minimal         Drains: none         Specimens: Gallbladder           Complications: None; patient tolerated the procedure well.         Disposition: PACU - hemodynamically stable.         Condition: stable  Robin Suarez. Andrey Campanile, MD, FACS General, Bariatric, & Minimally Invasive Surgery [facility], [place]

## 2014-08-09 NOTE — Progress Notes (Signed)
Emotional , "snuffing", no tears on admisson to PACU.  Oriented to post op status, surg finished, waking up. Denies pain, "feels OK". Encouraged to close eyes and sleep if needed. Fell asleep with easy resp.

## 2014-08-09 NOTE — Transfer of Care (Signed)
Immediate Anesthesia Transfer of Care Note  Patient: Robin Suarez  Procedure(s) Performed: Procedure(s): LAPAROSCOPIC CHOLECYSTECTOMY WITH INTRAOPERATIVE CHOLANGIOGRAM (N/A)  Patient Location: PACU  Anesthesia Type:General  Level of Consciousness: awake, alert  and oriented  Airway & Oxygen Therapy: Patient Spontanous Breathing and Patient connected to face mask oxygen  Post-op Assessment: Report given to PACU RN and Post -op Vital signs reviewed and stable  Post vital signs: Reviewed and stable  Complications: No apparent anesthesia complications

## 2014-08-09 NOTE — H&P (View-Only) (Signed)
Patient ID: Robin Suarez, female   DOB: 11-26-1996, 18 y.o.   MRN: [id number]  Chief Complaint  Patient presents with  . eval gallstones    HPI Robin Suarez is a 18 y.o. female.   HPI 18 year old Caucasian female referred by Dr. Abner GreenspanBlyth for evaluation of elevated transaminases. The patient had weight issues and was placed on phentermine to help with weight loss. She underwent LFT surveillance and was found to have some elevated liver function test which prompted an abdominal ultrasound and referral to her primary care physician. She was found to have gallstones and a normal common bile duct on her ultrasound. She states initially she didn't have any abdominal discomfort however of late she's had a few episodes of abdominal discomfort. She had a remote episode after eating fried chicken tenders where she vomited without having abdominal pain. Most recently she has noticed some episodes with certain foods where she will get a sharp pain like a spasm in her right upper abdomen which will radiate to her back. This occurred with broccoli. It lasted for several hours. She tried times without any relief. She states she now has constant heartburn. She denies any melena, hematochezia, acholic stools. She denies any NSAID use. She denies any hematemesis. She has lost about 32 pounds as part of a planned weight loss.. Past Medical History  Diagnosis Date  . Nickel allergy   . Chicken pox as a child  . Overactive bladder   . Vesico-ureteral reflux   . 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 Obesity 11/11/2011  . Social anxiety disorder 11/11/2011  . Contact dermatitis 06/04/2012  . Sinusitis 01/29/2013  . Anxiety and depression 11/11/2011    Past Surgical History  Procedure Laterality Date  . Broken arm      left, repair of ulna and radius  . Tubes in ears  18 yr old    Family History  Problem Relation Age of Onset  . Diabetes Father     type 2  . Other Father     muscular spasm of the heart   . Hyperlipidemia Father   . Hyperlipidemia Maternal Grandmother   . Hypertension Maternal Grandmother   . Diabetes Maternal Grandmother     type 2  . Other Paternal Grandmother     arrythmia  . Diabetes Paternal Grandmother     type 2  . Diabetes Paternal Grandfather     type 2  . Hyperlipidemia Paternal Grandfather   . Other Paternal Grandfather     fluid around the heart  . Cancer Maternal Grandfather     laryngeal, alcohol and tobacco    Social History History  Substance Use Topics  . Smoking status: Never Smoker   . Smokeless tobacco: Never Used  . Alcohol Use: No    Allergies  Allergen Reactions  . Latex     rash  . Cefprozil   . Nickel     Rash with contact  . Sulfa Antibiotics     Current Outpatient Prescriptions  Medication Sig Dispense Refill  . cholecalciferol (VITAMIN D) 1000 UNITS tablet Take 1,000 Units by mouth daily.      . norethindrone-ethinyl estradiol (JUNEL FE,GILDESS FE,LOESTRIN FE) 1-20 MG-MCG tablet Take 1 tablet by mouth daily.      . phentermine 37.5 MG capsule Take 37.5 mg by mouth every morning.       No current facility-administered medications for this visit.    Review of Systems Review of Systems  Constitutional: Negative for fever,  activity change, appetite change and unexpected weight change.  HENT: Negative for nosebleeds and trouble swallowing.   Eyes: Negative for photophobia and visual disturbance.  Respiratory: Negative for chest tightness and shortness of breath.   Cardiovascular: Negative for chest pain and leg swelling.       Denies CP, SOB  Gastrointestinal: Positive for nausea and abdominal pain.  Genitourinary: Negative for dysuria and difficulty urinating.  Musculoskeletal: Negative for arthralgias.  Skin: Negative for pallor and rash.  Neurological: Negative for dizziness, seizures, facial asymmetry and numbness.          Hematological: Negative for adenopathy. Does not bruise/bleed easily.    Psychiatric/Behavioral: Negative for behavioral problems and agitation.    Blood pressure 130/70, pulse 80, temperature 98 F (36.7 C), height 5\' 4"  (1.626 m), weight 169 lb (76.658 kg), last menstrual period 05/30/2014.  Physical Exam Physical Exam  Vitals reviewed. Constitutional: She is oriented to person, place, and time. She appears well-developed and well-nourished. No distress.  HENT:  Head: Normocephalic and atraumatic.  Right Ear: External ear normal.  Left Ear: External ear normal.  Eyes: Conjunctivae are normal. No scleral icterus.  Neck: Normal range of motion. Neck supple. No tracheal deviation present. No thyromegaly present.  Cardiovascular: Normal rate and normal heart sounds.   Pulmonary/Chest: Effort normal and breath sounds normal. No stridor. No respiratory distress. She has no wheezes.  Abdominal: Soft. She exhibits no distension. There is no tenderness. There is no rebound and no guarding.    Musculoskeletal: She exhibits no edema and no tenderness.  Lymphadenopathy:    She has no cervical adenopathy.  Neurological: She is alert and oriented to person, place, and time. She exhibits normal muscle tone.  Skin: Skin is warm and dry. No rash noted. She is not diaphoretic. No erythema.  Psychiatric: She has a normal mood and affect. Her behavior is normal. Judgment and thought content normal.    Data Reviewed abd u/s - +gallstones; nml common bile duct Dr Mariel AloeBlyth's office note Labs - 06/21/14 - T bili 1.5; AST 111, ALT 39, AP 64  Assessment    Symptomatic cholelithiasis Elevated AST/ALT     Plan    I believe the patient's symptoms are consistent with gallbladder disease. However I did explain to the patient and her parents that her transaminase elevation could be due to passage of a gallstone or a side effect from her medication. Nonetheless she has developed some symptoms which are consistent with symptomatic cholelithiasis  We discussed gallbladder  disease. The patient was given Agricultural engineereducational material. We discussed non-operative and operative management. We discussed the signs & symptoms of acute cholecystitis  I discussed laparoscopic cholecystectomy with IOC in detail.  The patient was given educational material as well as diagrams detailing the procedure.  We discussed the risks and benefits of a laparoscopic cholecystectomy including, but not limited to bleeding, infection, injury to surrounding structures such as the intestine or liver, bile leak, retained gallstones, need to convert to an open procedure, prolonged diarrhea, blood clots such as  DVT, common bile duct injury, anesthesia risks, and possible need for additional procedures such as ERCP.  We discussed the typical post-operative recovery course. I explained that the likelihood of improvement of their symptoms is good.   We will repeat her LFTs prior to surgery. If her LFTs are still elevated and her interoperative CHOLANGIOGRAM is normal, I explained this may prompt additional workup. All of their questions were asked and answered.  She has elected to  proceed with surgery. Our schedulers will call her in the next several days to schedule surgery. In the interim, she was given access to watch EMMI video on cholecystectomy  Mary Sella. Andrey Campanile, MD, FACS General, Bariatric, & Minimally Invasive Surgery [facility], [place]        [facility] M 07/15/2014, 2:01 PM

## 2014-08-09 NOTE — Anesthesia Preprocedure Evaluation (Signed)
Anesthesia Evaluation  Patient identified by MRN, date of birth, ID band Patient awake    Reviewed: Allergy & Precautions, H&P , NPO status , Patient's Chart, lab work & pertinent test results  History of Anesthesia Complications (+) PONV and history of anesthetic complications  Airway Mallampati: II TM Distance: >3 FB Neck ROM: Full    Dental no notable dental hx.    Pulmonary neg pulmonary ROS,  breath sounds clear to auscultation  Pulmonary exam normal       Cardiovascular negative cardio ROS  Rhythm:Regular Rate:Normal     Neuro/Psych PSYCHIATRIC DISORDERS  Neuromuscular disease    GI/Hepatic Neg liver ROS, GERD-  Medicated,  Endo/Other  negative endocrine ROS  Renal/GU negative Renal ROS     Musculoskeletal negative musculoskeletal ROS (+)   Abdominal   Peds  Hematology negative hematology ROS (+)   Anesthesia Other Findings   Reproductive/Obstetrics negative OB ROS                           Anesthesia Physical Anesthesia Plan  ASA: II  Anesthesia Plan: General   Post-op Pain Management:    Induction: Intravenous  Airway Management Planned: Oral ETT  Additional Equipment:   Intra-op Plan:   Post-operative Plan: Extubation in OR  Informed Consent: I have reviewed the patients History and Physical, chart, labs and discussed the procedure including the risks, benefits and alternatives for the proposed anesthesia with the patient or authorized representative who has indicated his/her understanding and acceptance.   Dental advisory given  Plan Discussed with: CRNA  Anesthesia Plan Comments:         Anesthesia Quick Evaluation

## 2014-08-09 NOTE — Progress Notes (Signed)
Pt ambulated to restroom with RN and mother.  Unable to void at this time.  IV fluids reconnected, pt advised to sip oral fluids if tolerating well.  Will monitor.  Ardyth Gal, RN 08/09/2014

## 2014-08-09 NOTE — Anesthesia Postprocedure Evaluation (Signed)
  Anesthesia Post-op Note  Patient: Robin Suarez  Procedure(s) Performed: Procedure(s): LAPAROSCOPIC CHOLECYSTECTOMY WITH INTRAOPERATIVE CHOLANGIOGRAM (N/A)  Patient Location: PACU  Anesthesia Type:General  Level of Consciousness: awake, alert  and oriented  Airway and Oxygen Therapy: Patient Spontanous Breathing  Post-op Pain: none  Post-op Assessment: Post-op Vital signs reviewed  Post-op Vital Signs: Reviewed  Last Vitals:  Filed Vitals:   08/09/14 1045  BP: 115/61  Pulse: 67  Temp:   Resp: 14    Complications: No apparent anesthesia complications

## 2014-08-09 NOTE — Interval H&P Note (Signed)
History and Physical Interval Note:  08/09/2014 8:24 AM  Robin Suarez  has presented today for surgery, with the diagnosis of Symptomatic Cholelethiasis  The various methods of treatment have been discussed with the patient and family. After consideration of risks, benefits and other options for treatment, the patient has consented to  Procedure(s): LAPAROSCOPIC CHOLECYSTECTOMY WITH INTRAOPERATIVE CHOLANGIOGRAM (N/A) as a surgical intervention .  The patient's history has been reviewed, patient examined, no change in status, stable for surgery.  I have reviewed the patient's chart and labs.  Questions were answered to the patient's satisfaction.    Mary SellaEric M. Andrey CampanileWilson, MD, FACS General, Bariatric, & Minimally Invasive Surgery [facility], [place]   [facility],Judah Carchi M

## 2014-08-09 NOTE — Discharge Instructions (Signed)
[facility] °LAPAROSCOPIC SURGERY: POST OP INSTRUCTIONS °Always review your discharge instruction sheet given to you by the facility where your surgery was performed. °IF YOU HAVE DISABILITY OR FAMILY LEAVE FORMS, YOU MUST BRING THEM TO THE OFFICE FOR PROCESSING.   °DO NOT GIVE THEM TO YOUR DOCTOR. ° °1. A prescription for pain medication may be given to you upon discharge.  Take your pain medication as prescribed, if needed.  If narcotic pain medicine is not needed, then you may take acetaminophen (Tylenol) or ibuprofen (Advil) as needed. °2. Take your usually prescribed medications unless otherwise directed. °3. If you need a refill on your pain medication, please contact your pharmacy.  They will contact our office to request authorization. Prescriptions will not be filled after 5pm or on week-ends. °4. You should follow a light diet the first few days after arrival home, such as soup and crackers, etc.  Be sure to include lots of fluids daily. °5. Most patients will experience some swelling and bruising in the area of the incisions.  Ice packs will help.  Swelling and bruising can take several days to resolve.  °6. It is common to experience some constipation if taking pain medication after surgery.  Increasing fluid intake and taking a stool softener (such as Colace) will usually help or prevent this problem from occurring.  A mild laxative (Milk of Magnesia or Miralax) should be taken according to package instructions if there are no bowel movements after 48 hours. °7. If your surgeon used skin glue on the incision, you may shower in 24 hours.  The glue will flake off over the next 2-3 weeks.  Any sutures or staples will be removed at the office during your follow-up visit. °8. ACTIVITIES:  You may resume regular (light) daily activities beginning the next day--such as daily self-care, walking, climbing stairs--gradually increasing activities as tolerated.  You may have sexual  intercourse when it is comfortable.  Refrain from any heavy lifting or straining until approved by your doctor. °a. You may drive when you are no longer taking prescription pain medication, you can comfortably wear a seatbelt, and you can safely maneuver your car and apply brakes. °9. You should see your doctor in the office for a follow-up appointment approximately 2-3 weeks after your surgery.  Make sure that you call for this appointment within a day or two after you arrive home to insure a convenient appointment time. °10. OTHER INSTRUCTIONS:  °WHEN TO CALL YOUR DOCTOR: °1. Fever over 101.0 °2. Inability to urinate °3. Continued bleeding from incision. °4. Increased pain, redness, or drainage from the incision. °5. Increasing abdominal pain ° °The clinic staff is available to answer your questions during regular business hours.  Please don’t hesitate to call and ask to speak to one of the nurses for clinical concerns.  If you have a medical emergency, go to the nearest emergency room or call 911.  A surgeon from [facility] is always on call at the hospital. °[address], [address], [place], [place]  [address] ? [address] [address], [place], [place]   [address] °[phone] ? [phone] ? FAX [phone] °Web site: [email or web address] ° °

## 2014-08-10 ENCOUNTER — Telehealth: Payer: Self-pay

## 2014-08-10 ENCOUNTER — Encounter (HOSPITAL_COMMUNITY): Payer: Self-pay | Admitting: General Surgery

## 2014-08-10 NOTE — Telephone Encounter (Signed)
None of her meds are notorious for trouble with the liver, let's have her do a liver function panel in 3-4 weeks to see what they look like and then we can reconsider

## 2014-08-10 NOTE — Telephone Encounter (Signed)
Tracee (pt's mom) stated that "Candace has had her surgery. Dr. Andrey CampanileWilson asked about pt taking a certain medication and wanted to know if it was elevating her enzymes and if she needs to continue taking it?" LDM

## 2014-08-11 ENCOUNTER — Telehealth (INDEPENDENT_AMBULATORY_CARE_PROVIDER_SITE_OTHER): Payer: Self-pay

## 2014-08-11 ENCOUNTER — Other Ambulatory Visit (INDEPENDENT_AMBULATORY_CARE_PROVIDER_SITE_OTHER): Payer: Self-pay

## 2014-08-11 ENCOUNTER — Ambulatory Visit (INDEPENDENT_AMBULATORY_CARE_PROVIDER_SITE_OTHER): Payer: BC Managed Care – PPO | Admitting: General Surgery

## 2014-08-11 ENCOUNTER — Telehealth (INDEPENDENT_AMBULATORY_CARE_PROVIDER_SITE_OTHER): Payer: Self-pay | Admitting: *Deleted

## 2014-08-11 ENCOUNTER — Other Ambulatory Visit (INDEPENDENT_AMBULATORY_CARE_PROVIDER_SITE_OTHER): Payer: Self-pay | Admitting: General Surgery

## 2014-08-11 ENCOUNTER — Encounter (INDEPENDENT_AMBULATORY_CARE_PROVIDER_SITE_OTHER): Payer: Self-pay | Admitting: General Surgery

## 2014-08-11 VITALS — BP 112/70 | HR 57 | Temp 98.8°F | Ht 63.0 in | Wt 164.0 lb

## 2014-08-11 DIAGNOSIS — Z9049 Acquired absence of other specified parts of digestive tract: Secondary | ICD-10-CM

## 2014-08-11 DIAGNOSIS — K8020 Calculus of gallbladder without cholecystitis without obstruction: Secondary | ICD-10-CM

## 2014-08-11 DIAGNOSIS — R945 Abnormal results of liver function studies: Principal | ICD-10-CM

## 2014-08-11 DIAGNOSIS — R5082 Postprocedural fever: Secondary | ICD-10-CM

## 2014-08-11 DIAGNOSIS — R7989 Other specified abnormal findings of blood chemistry: Secondary | ICD-10-CM

## 2014-08-11 DIAGNOSIS — R112 Nausea with vomiting, unspecified: Secondary | ICD-10-CM

## 2014-08-11 LAB — COMPREHENSIVE METABOLIC PANEL
ALT: 197 U/L — ABNORMAL HIGH (ref 0–35)
AST: 77 U/L — ABNORMAL HIGH (ref 0–37)
Albumin: 3.8 g/dL (ref 3.5–5.2)
Alkaline Phosphatase: 62 U/L (ref 39–117)
BUN: 8 mg/dL (ref 6–23)
CALCIUM: 9.9 mg/dL (ref 8.4–10.5)
CO2: 24 meq/L (ref 19–32)
CREATININE: 1.2 mg/dL — AB (ref 0.50–1.10)
Chloride: 105 mEq/L (ref 96–112)
GLUCOSE: 77 mg/dL (ref 70–99)
Potassium: 4.1 mEq/L (ref 3.5–5.3)
Sodium: 145 mEq/L (ref 135–145)
Total Bilirubin: 1.9 mg/dL — ABNORMAL HIGH (ref 0.2–1.1)
Total Protein: 6.3 g/dL (ref 6.0–8.3)

## 2014-08-11 MED ORDER — ONDANSETRON 4 MG PO TBDP: 4.0000 mg | ORAL_TABLET | Freq: Four times a day (QID) | ORAL | Status: DC | PRN

## 2014-08-11 NOTE — Patient Instructions (Signed)
Try dulcolax suppository tonight.    Try zofran for nausea.  If you still can't keep down liquids, you will need to come to the hospital for some IV fluids.  We will order a HIDA scan.

## 2014-08-11 NOTE — Telephone Encounter (Signed)
Pt s/p lap chole on 08/09/14 by Dr Andrey CampanileWilson. Pt had been doing very well up until around 11pm last night. Since that time she has not been able to keep any liquids down. Mother states they thought this was from her pain meds so they stopped taking the pain meds and symptoms continued. Pt has taken Ibuprofen and Zantax. Pt has since started running fevers of 101.0. Advised pt to take Tylenol to see if temp subsides. Spoke with Dr Andrey CampanileWilson, he has ordered CMET, CBC and Zofran. Dr Andrey CampanileWilson recommended that the pt has these drawn stat and then come into the urgent office this afternoon to see one of his partners.  Informed pts mother Robin Suarez Dr Robin ScaleWilson's recommendations. [facility] will [facility] this am and then they will come into the office this afternoon.

## 2014-08-11 NOTE — Assessment & Plan Note (Signed)
LFTs have gone up slightly.  I think she has an ileus and is throwing up due to lack of ability to move things through.    Suppository, zofran.  May need IVF Will check HIDA scan to rule out biliary leak.

## 2014-08-11 NOTE — Telephone Encounter (Signed)
Tried to call pt to let her know we have the Hida Scan scheduled that Dr. Andrey CampanileWilson ordered due to elevated EFT's.  [facility] 08[facility]-14 arriving at 12:45.  NPO after 6:45.   Thanks!  Victorino DikeJennifer

## 2014-08-11 NOTE — Progress Notes (Signed)
HISTORY: Pt is a 18 yo F 2 days post op from lap chole with IOC.  She had elevated t bili, ast, and ALT.  She called back with nausea/vomiting post op and subjective fever/chills.  She is not having any abdominal distention.  She denies passing gas or having a BM since surgery.  She is also complaining of some right shoulder pain.      EXAM: General:  Alert and oriented.   Incision:  Healing well.     PATHOLOGY: Diagnosis Gallbladder - CHRONIC CHOLECYSTITIS WITH CHOLELITHIASIS   ASSESSMENT AND PLAN:   Symptomatic cholelithiasis LFTs have gone up slightly.  I think she has an ileus and is throwing up due to lack of ability to move things through.    Suppository, zofran.  May need IVF Will check HIDA scan to rule out biliary leak.        Maudry DiegoFaera L Chibuikem Thang, MD Surgical Oncology, General & Endocrine Surgery [facility]  Danise EdgeBLYTH, STACEY, MD Bradd CanaryBlyth, Stacey A, MD

## 2014-08-12 ENCOUNTER — Telehealth (INDEPENDENT_AMBULATORY_CARE_PROVIDER_SITE_OTHER): Payer: Self-pay | Admitting: General Surgery

## 2014-08-12 ENCOUNTER — Encounter (INDEPENDENT_AMBULATORY_CARE_PROVIDER_SITE_OTHER): Payer: Self-pay | Admitting: General Surgery

## 2014-08-12 ENCOUNTER — Encounter (HOSPITAL_COMMUNITY)
Admission: RE | Admit: 2014-08-12 | Discharge: 2014-08-12 | Disposition: A | Discharge status: Home / Self Care | Payer: BC Managed Care – PPO | Source: Ambulatory Visit | Attending: General Surgery | Admitting: General Surgery

## 2014-08-12 DIAGNOSIS — Z9049 Acquired absence of other specified parts of digestive tract: Secondary | ICD-10-CM

## 2014-08-12 DIAGNOSIS — R945 Abnormal results of liver function studies: Secondary | ICD-10-CM

## 2014-08-12 DIAGNOSIS — R7989 Other specified abnormal findings of blood chemistry: Secondary | ICD-10-CM

## 2014-08-12 DIAGNOSIS — Z9089 Acquired absence of other organs: Secondary | ICD-10-CM | POA: Diagnosis present

## 2014-08-12 MED ORDER — TECHNETIUM TC 99M MEBROFENIN IV KIT
5.0000 | PACK | Freq: Once | INTRAVENOUS | Status: AC | PRN
  Administered 2014-08-12: 5 via INTRAVENOUS

## 2014-08-12 NOTE — Telephone Encounter (Signed)
Called to check on pt. Mothers says the nausea medication worked wonders. Drinking liquids and no emesis. No f/c. Pt's color much better.  On way to get HIDA at 1pm. Advised them I would call with results.

## 2014-08-15 ENCOUNTER — Other Ambulatory Visit (INDEPENDENT_AMBULATORY_CARE_PROVIDER_SITE_OTHER): Payer: Self-pay

## 2014-08-15 ENCOUNTER — Telehealth: Payer: Self-pay

## 2014-08-15 ENCOUNTER — Other Ambulatory Visit (INDEPENDENT_AMBULATORY_CARE_PROVIDER_SITE_OTHER): Payer: Self-pay | Admitting: General Surgery

## 2014-08-15 ENCOUNTER — Telehealth (INDEPENDENT_AMBULATORY_CARE_PROVIDER_SITE_OTHER): Payer: Self-pay

## 2014-08-15 DIAGNOSIS — Z9049 Acquired absence of other specified parts of digestive tract: Secondary | ICD-10-CM

## 2014-08-15 DIAGNOSIS — R5082 Postprocedural fever: Secondary | ICD-10-CM

## 2014-08-15 LAB — CBC WITH DIFFERENTIAL/PLATELET
BASOS ABS: 0 10*3/uL (ref 0.0–0.1)
Basophils Relative: 0 % (ref 0–1)
EOS PCT: 3 % (ref 0–5)
Eosinophils Absolute: 0.3 10*3/uL (ref 0.0–0.7)
HEMATOCRIT: 38.2 % (ref 36.0–46.0)
Hemoglobin: 13.1 g/dL (ref 12.0–15.0)
LYMPHS PCT: 15 % (ref 12–46)
Lymphs Abs: 1.3 10*3/uL (ref 0.7–4.0)
MCH: 30.8 pg (ref 26.0–34.0)
MCHC: 34.3 g/dL (ref 30.0–36.0)
MCV: 89.9 fL (ref 78.0–100.0)
MONO ABS: 0.5 10*3/uL (ref 0.1–1.0)
MONOS PCT: 6 % (ref 3–12)
Neutro Abs: 6.6 10*3/uL (ref 1.7–7.7)
Neutrophils Relative %: 76 % (ref 43–77)
Platelets: 257 10*3/uL (ref 150–400)
RBC: 4.25 MIL/uL (ref 3.87–5.11)
RDW: 13.5 % (ref 11.5–15.5)
WBC: 8.7 10*3/uL (ref 4.0–10.5)

## 2014-08-15 LAB — COMPREHENSIVE METABOLIC PANEL
ALT: 81 U/L — ABNORMAL HIGH (ref 0–35)
AST: 23 U/L (ref 0–37)
Albumin: 4 g/dL (ref 3.5–5.2)
Alkaline Phosphatase: 77 U/L (ref 39–117)
BUN: 9 mg/dL (ref 6–23)
CO2: 24 mEq/L (ref 19–32)
CREATININE: 0.81 mg/dL (ref 0.50–1.10)
Calcium: 9.2 mg/dL (ref 8.4–10.5)
Chloride: 107 mEq/L (ref 96–112)
GLUCOSE: 91 mg/dL (ref 70–99)
Potassium: 4.3 mEq/L (ref 3.5–5.3)
Sodium: 139 mEq/L (ref 135–145)
Total Bilirubin: 1.1 mg/dL (ref 0.2–1.1)
Total Protein: 6.2 g/dL (ref 6.0–8.3)

## 2014-08-15 NOTE — Telephone Encounter (Signed)
So I double checked the adverse reactions profile and elevated LFTs does not come up. The best way to test this is to stop the Phentermine for 2-4 weeks and recheck LFTs

## 2014-08-15 NOTE — Telephone Encounter (Signed)
Dr. Andrey Campanile unable to reach this patient last Friday.  LM today that pt's HIDA scan was normal.  She will need to get a repeat CBC and CMET before this Wednesday, 8/26.  Orders are in Epic.  Please instruct the patient.

## 2014-08-15 NOTE — Telephone Encounter (Signed)
LMOVM advising dad of MD response

## 2014-08-15 NOTE — Telephone Encounter (Signed)
Pt mother called back and was given the message below.

## 2014-08-15 NOTE — Telephone Encounter (Signed)
Patients mother left a message on Wed (08-11-14) stating that pts surgeon thinks pts liver enzymes are elevated due to her taking phentermine? Pts mother states Dr Abner Greenspan does not prescribe this but surgeon stated that daughter needs to ask Primary MD?  Please advise?

## 2014-08-16 ENCOUNTER — Encounter (INDEPENDENT_AMBULATORY_CARE_PROVIDER_SITE_OTHER): Payer: Self-pay | Admitting: General Surgery

## 2014-08-17 ENCOUNTER — Other Ambulatory Visit (INDEPENDENT_AMBULATORY_CARE_PROVIDER_SITE_OTHER): Payer: Self-pay | Admitting: General Surgery

## 2014-08-17 DIAGNOSIS — K8020 Calculus of gallbladder without cholecystitis without obstruction: Secondary | ICD-10-CM

## 2014-09-01 ENCOUNTER — Ambulatory Visit: Payer: BC Managed Care – PPO | Admitting: Gastroenterology

## 2014-09-06 LAB — COMPREHENSIVE METABOLIC PANEL
ALT: 24 U/L (ref 0–35)
AST: 16 U/L (ref 0–37)
Albumin: 4.4 g/dL (ref 3.5–5.2)
Alkaline Phosphatase: 70 U/L (ref 39–117)
BUN: 12 mg/dL (ref 6–23)
CALCIUM: 9.4 mg/dL (ref 8.4–10.5)
CO2: 23 meq/L (ref 19–32)
Chloride: 104 mEq/L (ref 96–112)
Creat: 0.67 mg/dL (ref 0.50–1.10)
Glucose, Bld: 78 mg/dL (ref 70–99)
Potassium: 4.2 mEq/L (ref 3.5–5.3)
SODIUM: 137 meq/L (ref 135–145)
TOTAL PROTEIN: 6.3 g/dL (ref 6.0–8.3)
Total Bilirubin: 0.7 mg/dL (ref 0.2–1.1)

## 2014-09-07 ENCOUNTER — Encounter (INDEPENDENT_AMBULATORY_CARE_PROVIDER_SITE_OTHER): Payer: BC Managed Care – PPO | Admitting: General Surgery

## 2014-09-20 ENCOUNTER — Other Ambulatory Visit: Payer: Self-pay

## 2014-09-20 MED ORDER — ALUMINUM CHLORIDE 20 % EX SOLN
Freq: Every evening | CUTANEOUS | Status: DC | PRN
Start: 2014-09-20 — End: 2017-02-08

## 2014-11-09 ENCOUNTER — Encounter: Payer: Self-pay | Admitting: Family Medicine

## 2014-11-30 ENCOUNTER — Encounter: Payer: Self-pay | Admitting: Family Medicine

## 2014-12-01 ENCOUNTER — Other Ambulatory Visit: Payer: Self-pay | Admitting: Family Medicine

## 2014-12-01 MED ORDER — FLUOCINONIDE 0.05 % EX CREA: 1.0000 "application " | TOPICAL_CREAM | Freq: Two times a day (BID) | CUTANEOUS | Status: DC

## 2014-12-01 NOTE — Telephone Encounter (Signed)
Please advise 

## 2016-04-16 ENCOUNTER — Ambulatory Visit (INDEPENDENT_AMBULATORY_CARE_PROVIDER_SITE_OTHER): Payer: Self-pay | Admitting: Family Medicine

## 2016-04-16 ENCOUNTER — Encounter: Payer: Self-pay | Admitting: Family Medicine

## 2016-04-16 VITALS — BP 122/86 | HR 94 | Temp 98.4°F | Ht 63.0 in | Wt 208.5 lb

## 2016-04-16 DIAGNOSIS — G43909 Migraine, unspecified, not intractable, without status migrainosus: Secondary | ICD-10-CM

## 2016-04-16 DIAGNOSIS — R1013 Epigastric pain: Secondary | ICD-10-CM

## 2016-04-16 DIAGNOSIS — F32A Depression, unspecified: Secondary | ICD-10-CM

## 2016-04-16 DIAGNOSIS — R51 Headache: Secondary | ICD-10-CM

## 2016-04-16 DIAGNOSIS — R748 Abnormal levels of other serum enzymes: Secondary | ICD-10-CM

## 2016-04-16 DIAGNOSIS — F419 Anxiety disorder, unspecified: Secondary | ICD-10-CM

## 2016-04-16 DIAGNOSIS — F329 Major depressive disorder, single episode, unspecified: Secondary | ICD-10-CM

## 2016-04-16 DIAGNOSIS — R740 Nonspecific elevation of levels of transaminase and lactic acid dehydrogenase [LDH]: Secondary | ICD-10-CM

## 2016-04-16 DIAGNOSIS — F418 Other specified anxiety disorders: Secondary | ICD-10-CM

## 2016-04-16 DIAGNOSIS — R5382 Chronic fatigue, unspecified: Secondary | ICD-10-CM

## 2016-04-16 DIAGNOSIS — R519 Headache, unspecified: Secondary | ICD-10-CM

## 2016-04-16 DIAGNOSIS — E669 Obesity, unspecified: Secondary | ICD-10-CM

## 2016-04-16 DIAGNOSIS — G43001 Migraine without aura, not intractable, with status migrainosus: Secondary | ICD-10-CM

## 2016-04-16 HISTORY — DX: Migraine, unspecified, not intractable, without status migrainosus: G43.909

## 2016-04-16 MED ORDER — NORETHINDRONE 0.35 MG PO TABS: 1.0000 | ORAL_TABLET | Freq: Every day | ORAL | Status: DC

## 2016-04-16 MED ORDER — ALBUTEROL SULFATE HFA 108 (90 BASE) MCG/ACT IN AERS: 2.0000 | INHALATION_SPRAY | Freq: Four times a day (QID) | RESPIRATORY_TRACT | Status: DC | PRN

## 2016-04-16 NOTE — Progress Notes (Signed)
Subjective:    Patient ID: Zada Findersandice E Gondek, female    DOB: Apr 23, 1996, 20 y.o.   MRN: [id number]  Chief Complaint  Patient presents with  . Fatigue  . Migraine    HPI Patient is in today for fatigue, and reports this has been going on a couple of months now.  Patient also reports some pain in chest that was radiating to the left side, and also reports some SOB.  Patient has had some past history of mono but patient reports feeling two lumps on the left side of neck.     Past Medical History  Diagnosis Date  . Nickel allergy   . Chicken pox as a child  . Vesico-ureteral reflux     BLADDER REFLUX AS A CHILD - NO PROBLEM NOW  . 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 Obesity 11/11/2011    PT HAS LOST AT LEAST 100 LBS OVER PAST 9 TO 10 MONTHS-NO LONGER OBESE  . Social anxiety disorder 11/11/2011  . Contact dermatitis 06/04/2012    TO NICKLE  . Sinusitis 01/29/2013  . Anxiety and depression 11/11/2011  . Gallstones   . GERD (gastroesophageal reflux disease)   . Overactive bladder     NO LONGER A PROBLEM  . PONV (postoperative nausea and vomiting)     NAUSEA AFTER ONE SURGERY AS A CHILD    Past Surgical History  Procedure Laterality Date  . Broken arm      left, repair of ulna and radius  . Tubes in ears  20 yr old  . Wisdom tooth extraction  NOV 2013  . Cholecystectomy N/A 08/09/2014    Procedure: LAPAROSCOPIC CHOLECYSTECTOMY WITH INTRAOPERATIVE CHOLANGIOGRAM;  Surgeon: Atilano InaEric M Wilson, MD;  Location: [facility];  Service: General;  Laterality: N/A;    Family History  Problem Relation Age of Onset  . Diabetes Father     type 2  . Other Father     muscular spasm of the heart  . Hyperlipidemia Father   . Hyperlipidemia Maternal Grandmother   . Hypertension Maternal Grandmother   . Diabetes Maternal Grandmother     type 2  . Other Paternal Grandmother     arrythmia  . Diabetes Paternal Grandmother     type 2  . Diabetes Paternal Grandfather     type 2  . Hyperlipidemia  Paternal Grandfather   . Other Paternal Grandfather     fluid around the heart  . Cancer Maternal Grandfather     laryngeal, alcohol and tobacco    Social History   Social History  . Marital Status: Single    Spouse Name: N/A  . Number of Children: N/A  . Years of Education: N/A   Occupational History  . Not on file.   Social History Main Topics  . Smoking status: Never Smoker   . Smokeless tobacco: Never Used  . Alcohol Use: No  . Drug Use: No  . Sexual Activity: No   Other Topics Concern  . Not on file   Social History Narrative   Homeschooled, junior HS level.   No sibs.  [place] mom [place].   Dad smokes.  Dogs in home.  [place].          Outpatient Prescriptions Prior to Visit  Medication Sig Dispense Refill  . aluminum chloride (HYPERCARE) 20 % external solution Apply topically at bedtime as needed. 60 mL 0  . fluocinonide cream (LIDEX) 0.05 % Apply 1 application topically 2 (two) times  daily. 30 g 0  . calcium carbonate (TUMS - DOSED IN MG ELEMENTAL CALCIUM) 500 MG chewable tablet Chew 2 tablets by mouth 3 (three) times daily with meals. Reported on 04/16/2016    . alum & mag hydroxide-simeth (MAALOX/MYLANTA) 200-200-20 MG/5ML suspension Take 30 mLs by mouth every 6 (six) hours as needed for indigestion or heartburn.    Marland Kitchen MICROGESTIN 1-20 MG-MCG tablet Take 1 tablet by mouth daily.     . ondansetron (ZOFRAN ODT) 4 MG disintegrating tablet Take 1 tablet (4 mg total) by mouth every 6 (six) hours as needed for nausea or vomiting. 15 tablet 0  . oxycodone (OXY-IR) 5 MG capsule Take 1-2 capsules (5-10 mg total) by mouth every 4 (four) hours as needed. 40 capsule 0  . ranitidine (ZANTAC) 150 MG tablet Take 1 tablet (150 mg total) by mouth 2 (two) times daily. 60 tablet 0   No facility-administered medications prior to visit.    Allergies  Allergen Reactions  . Cefprozil Other (See Comments)    RASH HEAD TO TOE  . Nickel     Rash with contact    . Sulfa Antibiotics     NAUSEA, GI UPSET    Review of Systems  Constitutional: Positive for malaise/fatigue. Negative for fever.  HENT: Negative for congestion.   Eyes: Negative for blurred vision.  Respiratory: Positive for shortness of breath.   Cardiovascular: Negative for chest pain, palpitations and leg swelling.  Gastrointestinal: Negative for nausea, abdominal pain and blood in stool.  Genitourinary: Negative for dysuria and frequency.  Musculoskeletal: Negative for falls.  Skin: Negative for rash.  Neurological: Negative for dizziness, loss of consciousness and headaches.  Endo/Heme/Allergies: Negative for environmental allergies.  Psychiatric/Behavioral: Negative for depression. The patient is not nervous/anxious.        Objective:    Physical Exam  Constitutional: She is oriented to person, place, and time. She appears well-developed and well-nourished. No distress.  HENT:  Head: Normocephalic and atraumatic.  Eyes: Conjunctivae are normal.  Neck: Neck supple. No thyromegaly present.  Cardiovascular: Normal rate, regular rhythm and normal heart sounds.   No murmur heard. Pulmonary/Chest: Effort normal and breath sounds normal. No respiratory distress.  Abdominal: Soft. Bowel sounds are normal. She exhibits no distension and no mass. There is no tenderness.  Musculoskeletal: She exhibits no edema.  Lymphadenopathy:    She has no cervical adenopathy.  Neurological: She is alert and oriented to person, place, and time.  Skin: Skin is warm and dry.  Psychiatric: She has a normal mood and affect. Her behavior is normal.    BP 122/86 mmHg  Pulse 94  Temp(Src) 98.4 F (36.9 C) (Oral)  Ht  (1.6 m)  Wt 208 lb 8 oz (94.575 kg)  BMI 36.94 kg/m2  SpO2 96% Wt Readings from Last 3 Encounters:  04/16/16 208 lb 8 oz (94.575 kg)  08/11/14 164 lb (74.39 kg) (89 %*, Z = 1.24)  08/09/14 162 lb (73.483 kg) (88 %*, Z = 1.19)   * Growth percentiles are based on CDC 2-20  Years data.     Lab Results  Component Value Date   WBC 8.7 08/15/2014   HGB 13.1 08/15/2014   HCT 38.2 08/15/2014   PLT 257 08/15/2014   GLUCOSE 78 09/05/2014   ALT 24 09/05/2014   AST 16 09/05/2014   NA 137 09/05/2014   K 4.2 09/05/2014   CL 104 09/05/2014   CREATININE 0.67 09/05/2014   BUN 12 09/05/2014  CO2 23 09/05/2014   TSH 1.701 11/11/2011    Lab Results  Component Value Date   TSH 1.701 11/11/2011   Lab Results  Component Value Date   WBC 8.7 08/15/2014   HGB 13.1 08/15/2014   HCT 38.2 08/15/2014   MCV 89.9 08/15/2014   PLT 257 08/15/2014   Lab Results  Component Value Date   NA 137 09/05/2014   K 4.2 09/05/2014   CO2 23 09/05/2014   GLUCOSE 78 09/05/2014   BUN 12 09/05/2014   CREATININE 0.67 09/05/2014   BILITOT 0.7 09/05/2014   ALKPHOS 70 09/05/2014   AST 16 09/05/2014   ALT 24 09/05/2014   PROT 6.3 09/05/2014   ALBUMIN 4.4 09/05/2014   CALCIUM 9.4 09/05/2014   ANIONGAP 12 08/05/2014   No results found for: CHOL No results found for: HDL No results found for: LDLCALC No results found for: TRIG No results found for: CHOLHDL No results found for: EAVW0J     Assessment & Plan:   Problem List Items Addressed This Visit    None      I have discontinued Ms. Nouri's ranitidine, MICROGESTIN, alum & mag hydroxide-simeth, oxycodone, and ondansetron. I am also having her maintain her calcium carbonate, aluminum chloride, fluocinonide cream, norethindrone, and vitamin B-12.  Meds ordered this encounter  Medications  . norethindrone (MICRONOR,CAMILA,ERRIN) 0.35 MG tablet    Sig: Take 1 tablet by mouth daily.  . vitamin B-12 (CYANOCOBALAMIN) 1000 MCG tablet    Sig: Take 1,000 mcg by mouth daily.     Dayna Barker, RMA

## 2016-04-16 NOTE — Progress Notes (Signed)
Pre visit review using our clinic review tool, if applicable. No additional management support is needed unless otherwise documented below in the visit note. 

## 2016-04-16 NOTE — Patient Instructions (Signed)
DASH Eating Plan  DASH stands for "Dietary Approaches to Stop Hypertension." The DASH eating plan is a healthy eating plan that has been shown to reduce high blood pressure (hypertension). Additional health benefits may include reducing the risk of type 2 diabetes mellitus, heart disease, and stroke. The DASH eating plan may also help with weight loss.  WHAT DO I NEED TO KNOW ABOUT THE DASH EATING PLAN?  For the DASH eating plan, you will follow these general guidelines:  · Choose foods with a percent daily value for sodium of less than 5% (as listed on the food label).  · Use salt-free seasonings or herbs instead of table salt or sea salt.  · Check with your health care provider or pharmacist before using salt substitutes.  · Eat lower-sodium products, often labeled as "lower sodium" or "no salt added."  · Eat fresh foods.  · Eat more vegetables, fruits, and low-fat dairy products.  · Choose whole grains. Look for the word "whole" as the first word in the ingredient list.  · Choose fish and skinless chicken or turkey more often than red meat. Limit fish, poultry, and meat to 6 oz (170 g) each day.  · Limit sweets, desserts, sugars, and sugary drinks.  · Choose heart-healthy fats.  · Limit cheese to 1 oz (28 g) per day.  · Eat more home-cooked food and less restaurant, buffet, and fast food.  · Limit fried foods.  · Cook foods using methods other than frying.  · Limit canned vegetables. If you do use them, rinse them well to decrease the sodium.  · When eating at a restaurant, ask that your food be prepared with less salt, or no salt if possible.  WHAT FOODS CAN I EAT?  Seek help from a dietitian for individual calorie needs.  Grains  Whole grain or whole wheat bread. Brown rice. Whole grain or whole wheat pasta. Quinoa, bulgur, and whole grain cereals. Low-sodium cereals. Corn or whole wheat flour tortillas. Whole grain cornbread. Whole grain crackers. Low-sodium crackers.  Vegetables  Fresh or frozen vegetables  (raw, steamed, roasted, or grilled). Low-sodium or reduced-sodium tomato and vegetable juices. Low-sodium or reduced-sodium tomato sauce and paste. Low-sodium or reduced-sodium canned vegetables.   Fruits  All fresh, canned (in natural juice), or frozen fruits.  Meat and Other Protein Products  Ground beef (85% or leaner), grass-fed beef, or beef trimmed of fat. Skinless chicken or turkey. Ground chicken or turkey. Pork trimmed of fat. All fish and seafood. Eggs. Dried beans, peas, or lentils. Unsalted nuts and seeds. Unsalted canned beans.  Dairy  Low-fat dairy products, such as skim or 1% milk, 2% or reduced-fat cheeses, low-fat ricotta or cottage cheese, or plain low-fat yogurt. Low-sodium or reduced-sodium cheeses.  Fats and Oils  Tub margarines without trans fats. Light or reduced-fat mayonnaise and salad dressings (reduced sodium). Avocado. Safflower, olive, or canola oils. Natural peanut or almond butter.  Other  Unsalted popcorn and pretzels.  The items listed above may not be a complete list of recommended foods or beverages. Contact your dietitian for more options.  WHAT FOODS ARE NOT RECOMMENDED?  Grains  White bread. White pasta. White rice. Refined cornbread. Bagels and croissants. Crackers that contain trans fat.  Vegetables  Creamed or fried vegetables. Vegetables in a cheese sauce. Regular canned vegetables. Regular canned tomato sauce and paste. Regular tomato and vegetable juices.  Fruits  Dried fruits. Canned fruit in light or heavy syrup. Fruit juice.  Meat and Other Protein   Products  Fatty cuts of meat. Ribs, chicken wings, bacon, sausage, bologna, salami, chitterlings, fatback, hot dogs, bratwurst, and packaged luncheon meats. Salted nuts and seeds. Canned beans with salt.  Dairy  Whole or 2% milk, cream, half-and-half, and cream cheese. Whole-fat or sweetened yogurt. Full-fat cheeses or blue cheese. Nondairy creamers and whipped toppings. Processed cheese, cheese spreads, or cheese  curds.  Condiments  Onion and garlic salt, seasoned salt, table salt, and sea salt. Canned and packaged gravies. Worcestershire sauce. Tartar sauce. Barbecue sauce. Teriyaki sauce. Soy sauce, including reduced sodium. Steak sauce. Fish sauce. Oyster sauce. Cocktail sauce. Horseradish. Ketchup and mustard. Meat flavorings and tenderizers. Bouillon cubes. Hot sauce. Tabasco sauce. Marinades. Taco seasonings. Relishes.  Fats and Oils  Butter, stick margarine, lard, shortening, ghee, and bacon fat. Coconut, palm kernel, or palm oils. Regular salad dressings.  Other  Pickles and olives. Salted popcorn and pretzels.  The items listed above may not be a complete list of foods and beverages to avoid. Contact your dietitian for more information.  WHERE CAN I FIND MORE INFORMATION?  National Heart, Lung, and Blood Institute: www.nhlbi.nih.gov/health/health-topics/topics/dash/     This information is not intended to replace advice given to you by your health care provider. Make sure you discuss any questions you have with your health care provider.     Document Released: 11/28/2011 Document Revised: 12/30/2014 Document Reviewed: 10/13/2013  Elsevier Interactive Patient Education ©2016 [employer] Inc[employer]

## 2016-04-19 ENCOUNTER — Other Ambulatory Visit (INDEPENDENT_AMBULATORY_CARE_PROVIDER_SITE_OTHER): Payer: Self-pay

## 2016-04-19 DIAGNOSIS — R519 Headache, unspecified: Secondary | ICD-10-CM

## 2016-04-19 DIAGNOSIS — R51 Headache: Secondary | ICD-10-CM

## 2016-04-19 DIAGNOSIS — R5382 Chronic fatigue, unspecified: Secondary | ICD-10-CM

## 2016-04-19 LAB — CBC WITH DIFFERENTIAL/PLATELET
BASOS PCT: 0.4 % (ref 0.0–3.0)
Basophils Absolute: 0 10*3/uL (ref 0.0–0.1)
Eosinophils Absolute: 0.1 10*3/uL (ref 0.0–0.7)
Eosinophils Relative: 2.4 % (ref 0.0–5.0)
HCT: 40.9 % (ref 36.0–46.0)
Hemoglobin: 13.9 g/dL (ref 12.0–15.0)
Lymphocytes Relative: 32.8 % (ref 12.0–46.0)
Lymphs Abs: 1.7 10*3/uL (ref 0.7–4.0)
MCHC: 34 g/dL (ref 30.0–36.0)
MCV: 90.9 fl (ref 78.0–100.0)
MONO ABS: 0.5 10*3/uL (ref 0.1–1.0)
Monocytes Relative: 9 % (ref 3.0–12.0)
NEUTROS ABS: 2.9 10*3/uL (ref 1.4–7.7)
Neutrophils Relative %: 55.4 % (ref 43.0–77.0)
PLATELETS: 279 10*3/uL (ref 150.0–400.0)
RBC: 4.5 Mil/uL (ref 3.87–5.11)
RDW: 12.6 % (ref 11.5–15.5)
WBC: 5.2 10*3/uL (ref 4.0–10.5)

## 2016-04-19 LAB — TSH: TSH: 2.41 u[IU]/mL (ref 0.35–4.50)

## 2016-04-19 LAB — COMPREHENSIVE METABOLIC PANEL
ALT: 43 U/L — ABNORMAL HIGH (ref 0–35)
AST: 84 U/L — AB (ref 0–37)
Albumin: 4 g/dL (ref 3.5–5.2)
Alkaline Phosphatase: 67 U/L (ref 39–117)
BUN: 15 mg/dL (ref 6–23)
CALCIUM: 9.6 mg/dL (ref 8.4–10.5)
CO2: 28 meq/L (ref 19–32)
CREATININE: 0.9 mg/dL (ref 0.40–1.20)
Chloride: 105 mEq/L (ref 96–112)
GFR: 83.59 mL/min (ref >60.00)
Glucose, Bld: 76 mg/dL (ref 70–99)
Potassium: 4.1 mEq/L (ref 3.5–5.1)
SODIUM: 138 meq/L (ref 135–145)
Total Bilirubin: 1 mg/dL (ref 0.2–1.2)
Total Protein: 6.4 g/dL (ref 6.0–8.3)

## 2016-04-19 LAB — H. PYLORI ANTIBODY, IGG: H Pylori IgG: NEGATIVE

## 2016-04-23 ENCOUNTER — Other Ambulatory Visit: Payer: Self-pay

## 2016-04-29 NOTE — Assessment & Plan Note (Signed)
Acknowledges this could be contributing to fatigue. Encouraged adequate sleep, exercise and more.

## 2016-04-29 NOTE — Assessment & Plan Note (Addendum)
Labs today show persistence, minimize simple carbs and attempt modest weight loss

## 2016-04-29 NOTE — Assessment & Plan Note (Signed)
Encouraged DASH diet, decrease po intake and increase exercise as tolerated. Needs 7-8 hours of sleep nightly. Avoid trans fats, eat small, frequent meals every 4-5 hours with lean proteins, complex carbs and healthy fats. Minimize simple carbs, GMO foods. 

## 2016-04-29 NOTE — Assessment & Plan Note (Signed)
Recent flare, Well controlled, no changes to meds. Encouraged heart healthy diet such as the DASH diet and exercise as tolerated.

## 2016-09-27 ENCOUNTER — Ambulatory Visit: Payer: Self-pay | Admitting: Family Medicine

## 2017-01-31 ENCOUNTER — Ambulatory Visit: Payer: Self-pay | Admitting: Family Medicine

## 2017-02-08 ENCOUNTER — Emergency Department (HOSPITAL_BASED_OUTPATIENT_CLINIC_OR_DEPARTMENT_OTHER)
Admission: EM | Admit: 2017-02-08 | Discharge: 2017-02-08 | Disposition: A | Discharge status: Home / Self Care | Payer: Self-pay | Attending: Physician Assistant | Admitting: Physician Assistant

## 2017-02-08 ENCOUNTER — Emergency Department (HOSPITAL_BASED_OUTPATIENT_CLINIC_OR_DEPARTMENT_OTHER): Payer: Self-pay

## 2017-02-08 ENCOUNTER — Encounter (HOSPITAL_BASED_OUTPATIENT_CLINIC_OR_DEPARTMENT_OTHER): Payer: Self-pay | Admitting: *Deleted

## 2017-02-08 DIAGNOSIS — W228XXA Striking against or struck by other objects, initial encounter: Secondary | ICD-10-CM | POA: Insufficient documentation

## 2017-02-08 DIAGNOSIS — M542 Cervicalgia: Secondary | ICD-10-CM | POA: Insufficient documentation

## 2017-02-08 DIAGNOSIS — Y929 Unspecified place or not applicable: Secondary | ICD-10-CM | POA: Insufficient documentation

## 2017-02-08 DIAGNOSIS — Y939 Activity, unspecified: Secondary | ICD-10-CM | POA: Insufficient documentation

## 2017-02-08 DIAGNOSIS — S060X0A Concussion without loss of consciousness, initial encounter: Secondary | ICD-10-CM | POA: Insufficient documentation

## 2017-02-08 DIAGNOSIS — Y999 Unspecified external cause status: Secondary | ICD-10-CM | POA: Insufficient documentation

## 2017-02-08 NOTE — Discharge Instructions (Signed)
Please return to the ED as needed.

## 2017-02-08 NOTE — ED Notes (Signed)
Patient transported to CT 

## 2017-02-08 NOTE — ED Notes (Signed)
Pt and family given d/c instructions as per chart. Verbalizes understanding. No questions. 

## 2017-02-08 NOTE — ED Provider Notes (Addendum)
[facility]   CSN: [id number] Arrival date & time: 02/08/17  1930  By signing my name below, I, Modena Jansky, attest that this documentation has been prepared under the direction and in the presence of Naydeline Morace Randall An, MD. Electronically Signed: Modena Jansky, Scribe. 02/08/2017. 8:55 PM.  History   Chief Complaint Chief Complaint  Patient presents with  . Head Injury   The history is provided by the patient. No language interpreter was used.   HPI Comments: Robin Suarez is a 22 y.o. female who presents to the Emergency Department complaining of a head injury that occurred ed 6 days ago. She states she hit her head on a beam. No LOC. She reports associated worsening headache, posterior neck pain, intermittent hearing loss (right), and intermittent dizziness. No current dizziness. She denies any other complaints.     PCP: Danise Edge, MD  Past Medical History:  Diagnosis Date  . Anxiety and depression 11/11/2011  . Chicken pox as a child  . Contact dermatitis 06/04/2012   TO NICKLE  . Gallstones   . GERD (gastroesophageal reflux disease)   . 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 Migraine 04/16/2016  . Nickel allergy   . Obesity 11/11/2011   PT HAS LOST AT LEAST 100 LBS OVER PAST 9 TO 10 MONTHS-NO LONGER OBESE  . Overactive bladder    NO LONGER A PROBLEM  . PONV (postoperative nausea and vomiting)    NAUSEA AFTER ONE SURGERY AS A CHILD  . Sinusitis 01/29/2013  . Social anxiety disorder 11/11/2011  . Vesico-ureteral reflux    BLADDER REFLUX AS A CHILD - NO PROBLEM NOW    Patient Active Problem List   Diagnosis Date Noted  . Migraine 04/16/2016  . Symptomatic cholelithiasis 07/15/2014  . Abnormal AST and ALT 06/25/2014  . Loss of weight 06/25/2014  . Sinusitis 01/29/2013  . 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 Obesity 11/11/2011  . Anxiety and depression 11/11/2011  . Nickel allergy   . Overactive bladder   . Vesico-ureteral  reflux     Past Surgical History:  Procedure Laterality Date  . broken arm     left, repair of ulna and radius  . CHOLECYSTECTOMY N/A 08/09/2014   Procedure: LAPAROSCOPIC CHOLECYSTECTOMY WITH INTRAOPERATIVE CHOLANGIOGRAM;  Surgeon: Atilano Ina, MD;  Location: [facility];  Service: General;  Laterality: N/A;  . tubes in ears  21 yr old  . WISDOM TOOTH EXTRACTION  NOV 2013    OB History    No data available       Home Medications    Prior to Admission medications   Not on File    Family History Family History  Problem Relation Age of Onset  . Diabetes Father     type 2  . Other Father     muscular spasm of the heart  . Hyperlipidemia Father   . Hyperlipidemia Maternal Grandmother   . Hypertension Maternal Grandmother   . Diabetes Maternal Grandmother     type 2  . Other Paternal Grandmother     arrythmia  . Diabetes Paternal Grandmother     type 2  . Diabetes Paternal Grandfather     type 2  . Hyperlipidemia Paternal Grandfather   . Other Paternal Grandfather     fluid around the heart  . Cancer Maternal Grandfather     laryngeal, alcohol and tobacco    Social History Social History  Substance Use Topics  . Smoking status: Never Smoker  . Smokeless  tobacco: Never Used  . Alcohol use No     Allergies   Cefprozil; Nickel; and Sulfa antibiotics   Review of Systems Review of Systems  HENT: Positive for hearing loss (right ear, intermittent).   Musculoskeletal: Positive for neck pain (Posterior).  Neurological: Positive for dizziness and headaches. Negative for syncope.  All other systems reviewed and are negative.    Physical Exam Updated Vital Signs BP 141/73 (BP Location: Left Arm)   Pulse 86   Temp 98.2 F (36.8 C) (Oral)   Resp 18   Ht 5\' 4"  (1.626 m)   Wt 200 lb (90.7 kg)   LMP 01/25/2017   SpO2 100%   BMI 34.33 kg/m   Physical Exam  Constitutional: She is oriented to person, place, and time. She appears well-developed and  well-nourished. No distress.  HENT:  Head: Normocephalic.  Eyes: Conjunctivae and EOM are normal. Pupils are equal, round, and reactive to light.  Neck: Neck supple.  Cardiovascular: Normal rate and regular rhythm.   Pulmonary/Chest: Effort normal.  Abdominal: Soft.  Musculoskeletal: Normal range of motion.  Neurological: She is alert and oriented to person, place, and time.  Cranial nerves 2-12 intact. No pronator drift. Strength in all extremities is intact.   Skin: Skin is warm and dry.  Psychiatric: She has a normal mood and affect.  Nursing note and vitals reviewed.    ED Treatments / Results  DIAGNOSTIC STUDIES: Oxygen Saturation is 100% on RA, normal by my interpretation.    COORDINATION OF CARE: 8:59 PM- Pt advised of plan for treatment and pt agrees.  Labs (all labs ordered are listed, but only abnormal results are displayed) Labs Reviewed - No data to display  EKG  EKG Interpretation None       Radiology Ct Head Wo Contrast  Result Date: 02/08/2017 CLINICAL DATA:  21 year old female with injury to her head 6 days ago complaining of head and neck pain progressively worsening since that time. EXAM: CT HEAD WITHOUT CONTRAST CT CERVICAL SPINE WITHOUT CONTRAST TECHNIQUE: Multidetector CT imaging of the head and cervical spine was performed following the standard protocol without intravenous contrast. Multiplanar CT image reconstructions of the cervical spine were also generated. COMPARISON:  Head CT 03/08/2010. FINDINGS: CT HEAD FINDINGS Brain: No evidence of acute infarction, hemorrhage, hydrocephalus, extra-axial collection or mass lesion/mass effect. Vascular: No hyperdense vessel or unexpected calcification. Skull: Normal. Negative for fracture or focal lesion. Sinuses/Orbits: No acute finding. Other: None. CT CERVICAL SPINE FINDINGS Alignment: Reversal of normal cervical lordosis, presumably positional. Otherwise, normal. Skull base and vertebrae: No acute fracture. No  primary bone lesion or focal pathologic process. Soft tissues and spinal canal: No prevertebral fluid or swelling. No visible canal hematoma. Disc levels:  No significant degenerative disc disease. Upper chest: Negative. Other: None. IMPRESSION: 1. No acute abnormality of the skull, brain or cervical spine to account for the patient's symptoms. 2. Normal appearance of the brain. Electronically Signed   By: Trudie Reed M.D.   On: 02/08/2017 21:34   Ct Cervical Spine Wo Contrast  Result Date: 02/08/2017 CLINICAL DATA:  21 year old female with injury to her head 6 days ago complaining of head and neck pain progressively worsening since that time. EXAM: CT HEAD WITHOUT CONTRAST CT CERVICAL SPINE WITHOUT CONTRAST TECHNIQUE: Multidetector CT imaging of the head and cervical spine was performed following the standard protocol without intravenous contrast. Multiplanar CT image reconstructions of the cervical spine were also generated. COMPARISON:  Head CT 03/08/2010. FINDINGS: CT HEAD  FINDINGS Brain: No evidence of acute infarction, hemorrhage, hydrocephalus, extra-axial collection or mass lesion/mass effect. Vascular: No hyperdense vessel or unexpected calcification. Skull: Normal. Negative for fracture or focal lesion. Sinuses/Orbits: No acute finding. Other: None. CT CERVICAL SPINE FINDINGS Alignment: Reversal of normal cervical lordosis, presumably positional. Otherwise, normal. Skull base and vertebrae: No acute fracture. No primary bone lesion or focal pathologic process. Soft tissues and spinal canal: No prevertebral fluid or swelling. No visible canal hematoma. Disc levels:  No significant degenerative disc disease. Upper chest: Negative. Other: None. IMPRESSION: 1. No acute abnormality of the skull, brain or cervical spine to account for the patient's symptoms. 2. Normal appearance of the brain. Electronically Signed   By: Trudie Reedaniel  Entrikin M.D.   On: 02/08/2017 21:34    Procedures Procedures (including  critical care time)  Medications Ordered in ED Medications - No data to display   Initial Impression / Assessment and Plan / ED Course  I have reviewed the triage vital signs and the nursing notes.  Pertinent labs & imaging results that were available during my care of the patient were reviewed by me and considered in my medical decision making (see chart for details).     I personally performed the services described in this documentation, which was scribed in my presence. The recorded information has been reviewed and is accurate.    Patient is a 21 year old female presenting with head injury. Patient had her head a week ago and has had head and neck pain. Had patient centered discussion about whether to image or not. Mother would like imaging at this time. Patient has no neurologic symptoms. Does have symptoms consistent with concussion such as dizziness. We will send her home with concussion precautions.  9:43 PM CT head and neck are normal.  We'll nerves II through XII normal. Normal strength. Reassuring exam we'll discharge. Final Clinical Impressions(s) / ED Diagnoses   Final diagnoses:  None    New Prescriptions New Prescriptions   No medications on file      Miklos Bidinger Randall AnLyn Marky Buresh, MD 02/08/17 2143    Kaycee Mcgaugh Lyn Hafiz Irion, MD 02/08/17 2143

## 2017-02-08 NOTE — ED Triage Notes (Signed)
Reports that she hit her head on Sunday night.  States that Tuesday she started feeling flushed, Wednesday started having head and neck pain that has progressively gotten worse.  Denies LOC.  Reports nausea and dizziness, denies vomiting.

## 2017-02-14 ENCOUNTER — Ambulatory Visit (INDEPENDENT_AMBULATORY_CARE_PROVIDER_SITE_OTHER): Payer: Self-pay | Admitting: Family Medicine

## 2017-02-14 ENCOUNTER — Encounter: Payer: Self-pay | Admitting: Family Medicine

## 2017-02-14 DIAGNOSIS — E6609 Other obesity due to excess calories: Secondary | ICD-10-CM

## 2017-02-14 DIAGNOSIS — Z309 Encounter for contraceptive management, unspecified: Secondary | ICD-10-CM

## 2017-02-14 MED ORDER — PHENTERMINE HCL 37.5 MG PO CAPS: 37.5000 mg | ORAL_CAPSULE | ORAL | 1 refills | Status: DC

## 2017-02-14 MED ORDER — NORETHIN ACE-ETH ESTRAD-FE 1-20 MG-MCG PO TABS: 1.0000 | ORAL_TABLET | Freq: Every day | ORAL | 5 refills | Status: DC

## 2017-02-14 NOTE — Progress Notes (Signed)
Pre visit review using our clinic review tool, if applicable. No additional management support is needed unless otherwise documented below in the visit note. 

## 2017-02-14 NOTE — Patient Instructions (Signed)
DASH Eating Plan DASH stands for "Dietary Approaches to Stop Hypertension." The DASH eating plan is a healthy eating plan that has been shown to reduce high blood pressure (hypertension). Additional health benefits may include reducing the risk of type 2 diabetes mellitus, heart disease, and stroke. The DASH eating plan may also help with weight loss. What do I need to know about the DASH eating plan? For the DASH eating plan, you will follow these general guidelines:  Choose foods with less than 150 milligrams of sodium per serving (as listed on the food label).  Use salt-free seasonings or herbs instead of table salt or sea salt.  Check with your health care provider or pharmacist before using salt substitutes.  Eat lower-sodium products. These are often labeled as "low-sodium" or "no salt added."  Eat fresh foods. Avoid eating a lot of canned foods.  Eat more vegetables, fruits, and low-fat dairy products.  Choose whole grains. Look for the word "whole" as the first word in the ingredient list.  Choose fish and skinless chicken or turkey more often than red meat. Limit fish, poultry, and meat to 6 oz (170 g) each day.  Limit sweets, desserts, sugars, and sugary drinks.  Choose heart-healthy fats.  Eat more home-cooked food and less restaurant, buffet, and fast food.  Limit fried foods.  Do not fry foods. Cook foods using methods such as baking, boiling, grilling, and broiling instead.  When eating at a restaurant, ask that your food be prepared with less salt, or no salt if possible. What foods can I eat? Seek help from a dietitian for individual calorie needs. Grains  Whole grain or whole wheat bread. Brown rice. Whole grain or whole wheat pasta. Quinoa, bulgur, and whole grain cereals. Low-sodium cereals. Corn or whole wheat flour tortillas. Whole grain cornbread. Whole grain crackers. Low-sodium crackers. Vegetables  Fresh or frozen vegetables (raw, steamed, roasted, or  grilled). Low-sodium or reduced-sodium tomato and vegetable juices. Low-sodium or reduced-sodium tomato sauce and paste. Low-sodium or reduced-sodium canned vegetables. Fruits  All fresh, canned (in natural juice), or frozen fruits. Meat and Other Protein Products  Ground beef (85% or leaner), grass-fed beef, or beef trimmed of fat. Skinless chicken or turkey. Ground chicken or turkey. Pork trimmed of fat. All fish and seafood. Eggs. Dried beans, peas, or lentils. Unsalted nuts and seeds. Unsalted canned beans. Dairy  Low-fat dairy products, such as skim or 1% milk, 2% or reduced-fat cheeses, low-fat ricotta or cottage cheese, or plain low-fat yogurt. Low-sodium or reduced-sodium cheeses. Fats and Oils  Tub margarines without trans fats. Light or reduced-fat mayonnaise and salad dressings (reduced sodium). Avocado. Safflower, olive, or canola oils. Natural peanut or almond butter. Other  Unsalted popcorn and pretzels. The items listed above may not be a complete list of recommended foods or beverages. Contact your dietitian for more options.  What foods are not recommended? Grains  White bread. White pasta. White rice. Refined cornbread. Bagels and croissants. Crackers that contain trans fat. Vegetables  Creamed or fried vegetables. Vegetables in a cheese sauce. Regular canned vegetables. Regular canned tomato sauce and paste. Regular tomato and vegetable juices. Fruits  Canned fruit in light or heavy syrup. Fruit juice. Meat and Other Protein Products  Fatty cuts of meat. Ribs, chicken wings, bacon, sausage, bologna, salami, chitterlings, fatback, hot dogs, bratwurst, and packaged luncheon meats. Salted nuts and seeds. Canned beans with salt. Dairy  Whole or 2% milk, cream, half-and-half, and cream cheese. Whole-fat or sweetened yogurt. Full-fat cheeses   or blue cheese. Nondairy creamers and whipped toppings. Processed cheese, cheese spreads, or cheese curds. Condiments  Onion and garlic  salt, seasoned salt, table salt, and sea salt. Canned and packaged gravies. Worcestershire sauce. Tartar sauce. Barbecue sauce. Teriyaki sauce. Soy sauce, including reduced sodium. Steak sauce. Fish sauce. Oyster sauce. Cocktail sauce. Horseradish. Ketchup and mustard. Meat flavorings and tenderizers. Bouillon cubes. Hot sauce. Tabasco sauce. Marinades. Taco seasonings. Relishes. Fats and Oils  Butter, stick margarine, lard, shortening, ghee, and bacon fat. Coconut, palm kernel, or palm oils. Regular salad dressings. Other  Pickles and olives. Salted popcorn and pretzels. The items listed above may not be a complete list of foods and beverages to avoid. Contact your dietitian for more information.  Where can I find more information? National Heart, Lung, and Blood Institute: www.nhlbi.nih.gov/health/health-topics/topics/dash/ This information is not intended to replace advice given to you by your health care provider. Make sure you discuss any questions you have with your health care provider. Document Released: 11/28/2011 Document Revised: 05/16/2016 Document Reviewed: 10/13/2013 Elsevier Interactive Patient Education  2017 Elsevier Inc.  

## 2017-02-23 DIAGNOSIS — Z309 Encounter for contraceptive management, unspecified: Secondary | ICD-10-CM | POA: Insufficient documentation

## 2017-02-23 NOTE — Assessment & Plan Note (Signed)
Not tolerating current med, will change to a different low dose OCP

## 2017-02-23 NOTE — Assessment & Plan Note (Addendum)
Encouraged DASH diet, decrease po intake and increase exercise as tolerated. Needs 7-8 hours of sleep nightly. Avoid trans fats, eat small, frequent meals every 4-5 hours with lean proteins, complex carbs and healthy fats. Minimize simple carbs, referred to bariatric program but she may not be able to attend at this time. Allowed final refill on Phentermine

## 2017-02-23 NOTE — Progress Notes (Signed)
Patient ID: Robin Suarez, female   DOB: 1996-07-08, 21 y.o.   MRN: [id number]   Subjective:    Patient ID: Robin Suarez, female    DOB: 1996/07/09, 21 y.o.   MRN: [id number]  No chief complaint on file.   HPI Patient is in today for evaluation of weight gain and use of oral contraceptives. She is frustrated with weight gain and is hoping to take a short course of Phentermine. She is not exercising regularly. Denies CP/palp/SOB/HA/congestion/fevers/GI or GU c/o. Taking meds as prescribed  Past Medical History:  Diagnosis Date  . Anxiety and depression 11/11/2011  . Chicken pox as a child  . Contact dermatitis 06/04/2012   TO NICKLE  . Gallstones   . GERD (gastroesophageal reflux disease)   . 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 Migraine 04/16/2016  . Nickel allergy   . Obesity 11/11/2011   PT HAS LOST AT LEAST 100 LBS OVER PAST 9 TO 10 MONTHS-NO LONGER OBESE  . Overactive bladder    NO LONGER A PROBLEM  . PONV (postoperative nausea and vomiting)    NAUSEA AFTER ONE SURGERY AS A CHILD  . Sinusitis 01/29/2013  . Social anxiety disorder 11/11/2011  . Vesico-ureteral reflux    BLADDER REFLUX AS A CHILD - NO PROBLEM NOW    Past Surgical History:  Procedure Laterality Date  . broken arm     left, repair of ulna and radius  . CHOLECYSTECTOMY N/A 08/09/2014   Procedure: LAPAROSCOPIC CHOLECYSTECTOMY WITH INTRAOPERATIVE CHOLANGIOGRAM;  Surgeon: Atilano Ina, MD;  Location: [facility];  Service: General;  Laterality: N/A;  . tubes in ears  21 yr old  . WISDOM TOOTH EXTRACTION  NOV 2013    Family History  Problem Relation Age of Onset  . Diabetes Father     type 2  . Other Father     muscular spasm of the heart  . Hyperlipidemia Father   . Hyperlipidemia Maternal Grandmother   . Hypertension Maternal Grandmother   . Diabetes Maternal Grandmother     type 2  . Other Paternal Grandmother     arrythmia  . Diabetes Paternal Grandmother     type 2  . Diabetes Paternal  Grandfather     type 2  . Hyperlipidemia Paternal Grandfather   . Other Paternal Grandfather     fluid around the heart  . Cancer Maternal Grandfather     laryngeal, alcohol and tobacco    Social History   Social History  . Marital status: Single    Spouse name: N/A  . Number of children: N/A  . Years of education: N/A   Occupational History  . Not on file.   Social History Main Topics  . Smoking status: Never Smoker  . Smokeless tobacco: Never Used  . Alcohol use No  . Drug use: No  . Sexual activity: No   Other Topics Concern  . Not on file   Social History Narrative   Homeschooled, junior HS level.   No sibs.  Lives with mom and dad near [place].   Dad smokes.  Dogs in home.  Well water.          No outpatient prescriptions prior to visit.   No facility-administered medications prior to visit.     Allergies  Allergen Reactions  . Cefprozil Other (See Comments)    RASH HEAD TO TOE  . Nickel     Rash with contact  . Sulfa Antibiotics     NAUSEA, GI  UPSET    Review of Systems  Constitutional: Negative for fever and malaise/fatigue.  HENT: Negative for congestion.   Eyes: Negative for blurred vision.  Respiratory: Negative for shortness of breath.   Cardiovascular: Negative for chest pain, palpitations and leg swelling.  Gastrointestinal: Negative for abdominal pain, blood in stool and nausea.  Genitourinary: Negative for dysuria and frequency.  Musculoskeletal: Negative for falls.  Skin: Negative for rash.  Neurological: Negative for dizziness, loss of consciousness and headaches.  Endo/Heme/Allergies: Negative for environmental allergies.  Psychiatric/Behavioral: Negative for depression. The patient is nervous/anxious.        Objective:    Physical Exam  Constitutional: She is oriented to person, place, and time. She appears well-developed and well-nourished. No distress.  HENT:  Head: Normocephalic and atraumatic.  Nose: Nose normal.    Eyes: Right eye exhibits no discharge. Left eye exhibits no discharge.  Neck: Normal range of motion. Neck supple.  Cardiovascular: Normal rate and regular rhythm.   No murmur heard. Pulmonary/Chest: Effort normal and breath sounds normal.  Abdominal: Soft. Bowel sounds are normal. There is no tenderness.  Musculoskeletal: She exhibits no edema.  Neurological: She is alert and oriented to person, place, and time.  Skin: Skin is warm and dry.  Psychiatric: She has a normal mood and affect.  Nursing note and vitals reviewed.   BP 110/62 (BP Location: Left Arm, Patient Position: Sitting, Cuff Size: Normal)   Pulse 85   Temp 98.1 F (36.7 C) (Oral)   Resp 18   Wt 222 lb 12.8 oz (101.1 kg)   LMP 01/25/2017   SpO2 98%   BMI 38.24 kg/m  Wt Readings from Last 3 Encounters:  02/14/17 222 lb 12.8 oz (101.1 kg)  02/08/17 200 lb (90.7 kg)  04/16/16 208 lb 8 oz (94.6 kg)     Lab Results  Component Value Date   WBC 5.2 04/19/2016   HGB 13.9 04/19/2016   HCT 40.9 04/19/2016   PLT 279.0 04/19/2016   GLUCOSE 76 04/19/2016   ALT 43 (H) 04/19/2016   AST 84 (H) 04/19/2016   NA 138 04/19/2016   K 4.1 04/19/2016   CL 105 04/19/2016   CREATININE 0.90 04/19/2016   BUN 15 04/19/2016   CO2 28 04/19/2016   TSH 2.41 04/19/2016    Lab Results  Component Value Date   TSH 2.41 04/19/2016   Lab Results  Component Value Date   WBC 5.2 04/19/2016   HGB 13.9 04/19/2016   HCT 40.9 04/19/2016   MCV 90.9 04/19/2016   PLT 279.0 04/19/2016   Lab Results  Component Value Date   NA 138 04/19/2016   K 4.1 04/19/2016   CO2 28 04/19/2016   GLUCOSE 76 04/19/2016   BUN 15 04/19/2016   CREATININE 0.90 04/19/2016   BILITOT 1.0 04/19/2016   ALKPHOS 67 04/19/2016   AST 84 (H) 04/19/2016   ALT 43 (H) 04/19/2016   PROT 6.4 04/19/2016   ALBUMIN 4.0 04/19/2016   CALCIUM 9.6 04/19/2016   ANIONGAP 12 08/05/2014   GFR 83.59 04/19/2016   No results found for: CHOL No results found for: HDL No  results found for: LDLCALC No results found for: TRIG No results found for: CHOLHDL No results found for: ZOXW9U     Assessment & Plan:   Problem List Items Addressed This Visit    Obesity    Encouraged DASH diet, decrease po intake and increase exercise as tolerated. Needs 7-8 hours of sleep nightly. Avoid trans fats,  eat small, frequent meals every 4-5 hours with lean proteins, complex carbs and healthy fats. Minimize simple carbs, referred to bariatric program      Relevant Medications   phentermine 37.5 MG capsule   Contraceptive management    Not tolerating current med, will change to a different low dose OCP         I am having Ms. Hayashida start on phentermine. I am also having her maintain her norethindrone-ethinyl estradiol.  Meds ordered this encounter  Medications  . norethindrone-ethinyl estradiol (JUNEL FE,GILDESS FE,LOESTRIN FE) 1-20 MG-MCG tablet    Sig: Take 1 tablet by mouth daily.    Dispense:  1 Package    Refill:  5  . phentermine 37.5 MG capsule    Sig: Take 1 capsule (37.5 mg total) by mouth every morning.    Dispense:  30 capsule    Refill:  1     Danise Edge, MD

## 2017-04-29 ENCOUNTER — Encounter: Payer: Self-pay | Admitting: Family Medicine

## 2017-05-01 MED ORDER — PHENTERMINE HCL 37.5 MG PO CAPS: 37.5000 mg | ORAL_CAPSULE | ORAL | 1 refills | Status: DC

## 2017-08-11 ENCOUNTER — Other Ambulatory Visit: Payer: Self-pay | Admitting: Family Medicine

## 2017-08-11 ENCOUNTER — Encounter: Payer: Self-pay | Admitting: Family Medicine

## 2017-08-11 MED ORDER — NORETHIN ACE-ETH ESTRAD-FE 1-20 MG-MCG PO TABS: 1.0000 | ORAL_TABLET | Freq: Every day | ORAL | 1 refills | Status: DC

## 2017-08-11 NOTE — Telephone Encounter (Signed)
Faxed 30d till OV in sept/thx dmf

## 2017-08-15 ENCOUNTER — Ambulatory Visit: Payer: Self-pay | Admitting: Family Medicine

## 2017-09-05 ENCOUNTER — Ambulatory Visit: Payer: Self-pay | Admitting: Family Medicine

## 2017-09-06 ENCOUNTER — Other Ambulatory Visit: Payer: Self-pay | Admitting: Family Medicine

## 2017-10-22 ENCOUNTER — Other Ambulatory Visit: Payer: Self-pay | Admitting: Family Medicine

## 2017-12-09 ENCOUNTER — Other Ambulatory Visit: Payer: Self-pay | Admitting: Family Medicine

## 2018-01-25 ENCOUNTER — Other Ambulatory Visit: Payer: Self-pay | Admitting: Family Medicine

## 2018-01-27 MED ORDER — NORETHIN ACE-ETH ESTRAD-FE 1-20 MG-MCG PO TABS: 1.0000 | ORAL_TABLET | Freq: Every day | ORAL | 3 refills | Status: DC

## 2018-03-27 ENCOUNTER — Ambulatory Visit (INDEPENDENT_AMBULATORY_CARE_PROVIDER_SITE_OTHER): Payer: Self-pay | Admitting: Family Medicine

## 2018-03-27 DIAGNOSIS — F329 Major depressive disorder, single episode, unspecified: Secondary | ICD-10-CM

## 2018-03-27 DIAGNOSIS — R05 Cough: Secondary | ICD-10-CM

## 2018-03-27 DIAGNOSIS — F419 Anxiety disorder, unspecified: Secondary | ICD-10-CM

## 2018-03-27 DIAGNOSIS — R059 Cough, unspecified: Secondary | ICD-10-CM | POA: Insufficient documentation

## 2018-03-27 MED ORDER — NORETHIN ACE-ETH ESTRAD-FE 1-20 MG-MCG PO TABS: 1.0000 | ORAL_TABLET | Freq: Every day | ORAL | 5 refills | Status: DC

## 2018-03-27 MED ORDER — RANITIDINE HCL 150 MG PO TABS: 150.0000 mg | ORAL_TABLET | Freq: Two times a day (BID) | ORAL | 5 refills | Status: DC | PRN

## 2018-03-27 MED ORDER — CETIRIZINE HCL 10 MG PO TABS: 10.0000 mg | ORAL_TABLET | Freq: Two times a day (BID) | ORAL | 5 refills | Status: DC | PRN

## 2018-03-27 NOTE — Assessment & Plan Note (Signed)
Unclear etiology. 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 She could be struggling with allergic symptoms vs silent heartburn. Will start Zyrtec and Zantac twice daily to see if that helps if not she needs to let us know so we can work up more.

## 2018-03-27 NOTE — Progress Notes (Signed)
Subjective:  I acted as a Education administrator for Dr. Charlett Blake. [place], [place]  Patient ID: Robin Suarez, female    DOB: 02-10-1996, 22 y.o.   MRN: [id number]  No chief complaint on file.   HPI  Patient is in today for a follow up and she is accompanied by father. She is noting trouble with a dry cough over the past few months. She denies any recent febrile illness or hospitalizations. She also notes increased trouble with sleeping and notably she wakes up frequently around 1 am unable to sleep again. No am headache, no waking up choking. She admits to a good amount anxiety due to working full time and trying to go back to school. Denies CP/palp/SOB/HA/evers/GI or GU c/o. Taking meds as prescribed  Patient Care Team: Mosie Lukes, MD as PCP - General (Family Medicine)   Past Medical History:  Diagnosis Date  . Anxiety and depression 11/11/2011  . Chicken pox as a child  . Contact dermatitis 06/04/2012   TO NICKLE  . Gallstones   . GERD (gastroesophageal reflux disease)   . 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 Migraine 04/16/2016  . Nickel allergy   . Obesity 11/11/2011   PT HAS LOST AT LEAST 100 LBS OVER PAST 9 TO 10 MONTHS-NO LONGER OBESE  . Overactive bladder    NO LONGER A PROBLEM  . PONV (postoperative nausea and vomiting)    NAUSEA AFTER ONE SURGERY AS A CHILD  . Sinusitis 01/29/2013  . Social anxiety disorder 11/11/2011  . Vesico-ureteral reflux    BLADDER REFLUX AS A CHILD - NO PROBLEM NOW    Past Surgical History:  Procedure Laterality Date  . broken arm     left, repair of ulna and radius  . CHOLECYSTECTOMY N/A 08/09/2014   Procedure: LAPAROSCOPIC CHOLECYSTECTOMY WITH INTRAOPERATIVE CHOLANGIOGRAM;  Surgeon: Gayland Curry, MD;  Location: [facility];  Service: General;  Laterality: N/A;  . tubes in ears  22 yr old  . WISDOM TOOTH EXTRACTION  NOV 2013    Family History  Problem Relation Age of Onset  . Diabetes Father        type 2  . Other Father        muscular spasm of  the heart  . Hyperlipidemia Father   . Hyperlipidemia Maternal Grandmother   . Hypertension Maternal Grandmother   . Diabetes Maternal Grandmother        type 2  . Other Paternal Grandmother        arrythmia  . Diabetes Paternal Grandmother        type 2  . Diabetes Paternal Grandfather        type 2  . Hyperlipidemia Paternal Grandfather   . Other Paternal Grandfather        fluid around the heart  . Cancer Maternal Grandfather        laryngeal, alcohol and tobacco    Social History   Socioeconomic History  . Marital status: Single    Spouse name: Not on file  . Number of children: Not on file  . Years of education: Not on file  . Highest education level: Not on file  Occupational History  . Not on file  Social Needs  . Financial resource strain: Not on file  . Food insecurity:    Worry: Not on file    Inability: Not on file  . Transportation needs:    Medical: Not on file    Non-medical: Not on file  Tobacco Use  .  Smoking status: Never Smoker  . Smokeless tobacco: Never Used  Substance and Sexual Activity  . Alcohol use: No  . Drug use: No  . Sexual activity: Never  Lifestyle  . Physical activity:    Days per week: Not on file    Minutes per session: Not on file  . Stress: Not on file  Relationships  . Social connections:    Talks on phone: Not on file    Gets together: Not on file    Attends religious service: Not on file    Active member of club or organization: Not on file    Attends meetings of clubs or organizations: Not on file    Relationship status: Not on file  . Intimate partner violence:    Fear of current or ex partner: Not on file    Emotionally abused: Not on file    Physically abused: Not on file    Forced sexual activity: Not on file  Other Topics Concern  . Not on file  Social History Narrative   Homeschooled, junior HS level.   No sibs.  Lives with mom and dad near [place].   Dad smokes.  Dogs in home.  Well water.           Outpatient Medications Prior to Visit  Medication Sig Dispense Refill  . Multiple Vitamin (MULTIVITAMIN) tablet Take 1 tablet by mouth daily.    . norethindrone-ethinyl estradiol (JUNEL FE 1/20) 1-20 MG-MCG tablet Take 1 tablet by mouth daily. 28 tablet 3  . phentermine 37.5 MG capsule Take 1 capsule (37.5 mg total) by mouth every morning. 30 capsule 1   No facility-administered medications prior to visit.     Allergies  Allergen Reactions  . Cefprozil Other (See Comments)    RASH HEAD TO TOE  . Nickel     Rash with contact  . Sulfa Antibiotics     NAUSEA, GI UPSET    Review of Systems  Constitutional: Negative for fever and malaise/fatigue.  HENT: Positive for congestion.   Eyes: Negative for blurred vision.  Respiratory: Positive for cough. Negative for shortness of breath.   Cardiovascular: Negative for chest pain, palpitations and leg swelling.  Gastrointestinal: Negative for abdominal pain, blood in stool and nausea.  Genitourinary: Negative for dysuria and frequency.  Musculoskeletal: Negative for falls.  Skin: Negative for rash.  Neurological: Negative for dizziness, loss of consciousness and headaches.  Endo/Heme/Allergies: Negative for environmental allergies.  Psychiatric/Behavioral: Negative for depression. The patient is nervous/anxious and has insomnia.        Objective:    Physical Exam  Constitutional: She is oriented to person, place, and time. She appears well-developed and well-nourished. No distress.  HENT:  Head: Normocephalic and atraumatic.  Nose: Nose normal.  Eyes: Right eye exhibits no discharge. Left eye exhibits no discharge.  Neck: Normal range of motion. Neck supple.  Cardiovascular: Normal rate and regular rhythm.  No murmur heard. Pulmonary/Chest: Effort normal and breath sounds normal.  Abdominal: Soft. Bowel sounds are normal. There is no tenderness.  Musculoskeletal: She exhibits no edema.  Neurological: She is alert and  oriented to person, place, and time.  Skin: Skin is warm and dry.  Psychiatric: She has a normal mood and affect.  Nursing note and vitals reviewed.   BP 112/72 (BP Location: Left Arm, Patient Position: Sitting, Cuff Size: Normal)   Pulse 72   Temp 98.4 F (36.9 C) (Oral)   Resp 18   Wt 231 lb (104.8 kg)  SpO2 93%   BMI 39.65 kg/m  Wt Readings from Last 3 Encounters:  03/27/18 231 lb (104.8 kg)  02/14/17 222 lb 12.8 oz (101.1 kg)  02/08/17 200 lb (90.7 kg)   BP Readings from Last 3 Encounters:  03/27/18 112/72  02/14/17 110/62  02/08/17 114/67     Immunization History  Administered Date(s) Administered  . DTaP 12/12/1994, 02/10/1995, 04/17/1995, 01/16/1996, 05/02/2000  . Hepatitis B 09-22-1994, 12/12/1994, 04/17/1995  . HiB (PRP-OMP) 12/12/1994, 02/10/1995, 04/17/1995, 01/16/1996  . IPV 12/12/1994, 02/10/1995, 04/17/1995, 05/02/2000  . Influenza Split 09/04/2012  . Influenza-Unspecified 11/01/2013  . MMR 01/16/1996, 05/02/2000  . Tdap 11/11/2011    Health Maintenance  Topic Date Due  . HIV Screening  10/11/2009  . PAP SMEAR  10/12/2015  . INFLUENZA VACCINE  07/23/2018  . TETANUS/TDAP  11/10/2021    Lab Results  Component Value Date   WBC 5.2 04/19/2016   HGB 13.9 04/19/2016   HCT 40.9 04/19/2016   PLT 279.0 04/19/2016   GLUCOSE 76 04/19/2016   ALT 43 (H) 04/19/2016   AST 84 (H) 04/19/2016   NA 138 04/19/2016   K 4.1 04/19/2016   CL 105 04/19/2016   CREATININE 0.90 04/19/2016   BUN 15 04/19/2016   CO2 28 04/19/2016   TSH 2.41 04/19/2016    Lab Results  Component Value Date   TSH 2.41 04/19/2016   Lab Results  Component Value Date   WBC 5.2 04/19/2016   HGB 13.9 04/19/2016   HCT 40.9 04/19/2016   MCV 90.9 04/19/2016   PLT 279.0 04/19/2016   Lab Results  Component Value Date   NA 138 04/19/2016   K 4.1 04/19/2016   CO2 28 04/19/2016   GLUCOSE 76 04/19/2016   BUN 15 04/19/2016   CREATININE 0.90 04/19/2016   BILITOT 1.0 04/19/2016    ALKPHOS 67 04/19/2016   AST 84 (H) 04/19/2016   ALT 43 (H) 04/19/2016   PROT 6.4 04/19/2016   ALBUMIN 4.0 04/19/2016   CALCIUM 9.6 04/19/2016   ANIONGAP 12 08/05/2014   GFR 83.59 04/19/2016   No results found for: CHOL No results found for: HDL No results found for: LDLCALC No results found for: TRIG No results found for: CHOLHDL No results found for: HGBA1C       Assessment & Plan:   Problem List Items Addressed This Visit    Anxiety and depression    Is working full time and 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 Encouraged good sleep hygiene such as dark, quiet room. No blue/green glowing lights such as computer screens in bedroom. No alcohol or stimulants in evening. Cut down on caffeine as able. Regular exercise is helpful but not just prior to bed time. Is encouraged to use Melatonin, Benadryl and or Unisom nightly to try and reset her clock. If no improvement she needs to let us know.       Cough    Unclear etiology. 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 She could be struggling with allergic symptoms vs silent heartburn. Will start Zyrtec and Zantac twice daily to see if that helps if not she needs to let us know so we can work up more.          I have discontinued Curtis E. Kovacevic's phentermine. I am  also having her start on cetirizine and ranitidine. Additionally, I am having her maintain her norethindrone-ethinyl estradiol and multivitamin.  Meds ordered this encounter  Medications  . norethindrone-ethinyl estradiol (JUNEL FE 1/20) 1-20 MG-MCG tablet    Sig: Take 1 tablet by mouth daily.    Dispense:  28 tablet    Refill:  5  . cetirizine (ZYRTEC) 10 MG tablet    Sig: Take 1 tablet (10 mg total) by mouth 2 (two) times daily as needed for allergies.    Dispense:  30  tablet    Refill:  5  . ranitidine (ZANTAC) 150 MG tablet    Sig: Take 1 tablet (150 mg total) by mouth 2 (two) times daily as needed for heartburn.    Dispense:  60 tablet    Refill:  5    CMA served as scribe during this visit. History, Physical and Plan performed by medical provider. Documentation and orders reviewed and attested to.  Penni Homans, MD

## 2018-03-27 NOTE — Assessment & Plan Note (Signed)
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 Encouraged good sleep hygiene such as dark, quiet room. No blue/green glowing lights such as computer screens in bedroom. No alcohol or stimulants in evening. Cut down on caffeine as able. Regular exercise is helpful but not just prior to bed time. Is encouraged to use Melatonin, Benadryl and or Unisom nightly to try and reset her clock. If no improvement she needs to let us know.

## 2018-03-27 NOTE — Patient Instructions (Addendum)
Small, frequent meals with lean proteins, eat every 4-5 hours  Try Unisom, lower dose of Melatonin and/or Tylenol pm PM = Diphenhydramine/Benadryl 25 mg to 50 mg to help reset your clock  MIND diet DASH Eating Plan DASH stands for "Dietary Approaches to Stop Hypertension." The DASH eating plan is a healthy eating plan that has been shown to reduce high blood pressure (hypertension). It may also reduce your risk for type 2 diabetes, heart disease, and stroke. The DASH eating plan may also help with weight loss. What are tips for following this plan? General guidelines  Avoid eating more than 2,300 mg (milligrams) of salt (sodium) a day. If you have hypertension, you may need to reduce your sodium intake to 1,500 mg a day.  Limit alcohol intake to no more than 1 drink a day for nonpregnant women and 2 drinks a day for men. One drink equals 12 oz of beer, 5 oz of wine, or 1 oz of hard liquor.  Work with your health care provider to maintain a healthy body weight or to lose weight. Ask what an ideal weight is for you.  Get at least 30 minutes of exercise that causes your heart to beat faster (aerobic exercise) most days of the week. Activities may include walking, swimming, or biking.  Work with your health care provider or diet and nutrition specialist (dietitian) to adjust your eating plan to your individual calorie needs. Reading food labels  Check food labels for the amount of sodium per serving. Choose foods with less than 5 percent of the Daily Value of sodium. Generally, foods with less than 300 mg of sodium per serving fit into this eating plan.  To find whole grains, look for the word "whole" as the first word in the ingredient list. Shopping  Buy products labeled as "low-sodium" or "no salt added."  Buy fresh foods. Avoid canned foods and premade or frozen meals. Cooking  Avoid adding salt when cooking. Use salt-free seasonings or herbs instead of table salt or sea salt. Check  with your health care provider or pharmacist before using salt substitutes.  Do not fry foods. Cook foods using healthy methods such as baking, boiling, grilling, and broiling instead.  Cook with heart-healthy oils, such as olive, canola, soybean, or sunflower oil. Meal planning   Eat a balanced diet that includes: ? 5 or more servings of fruits and vegetables each day. At each meal, try to fill half of your plate with fruits and vegetables. ? Up to 6-8 servings of whole grains each day. ? Less than 6 oz of lean meat, poultry, or fish each day. A 3-oz serving of meat is about the same size as a deck of cards. One egg equals 1 oz. ? 2 servings of low-fat dairy each day. ? A serving of nuts, seeds, or beans 5 times each week. ? Heart-healthy fats. Healthy fats called Omega-3 fatty acids are found in foods such as flaxseeds and coldwater fish, like sardines, salmon, and mackerel.  Limit how much you eat of the following: ? Canned or prepackaged foods. ? Food that is high in trans fat, such as fried foods. ? Food that is high in saturated fat, such as fatty meat. ? Sweets, desserts, sugary drinks, and other foods with added sugar. ? Full-fat dairy products.  Do not salt foods before eating.  Try to eat at least 2 vegetarian meals each week.  Eat more home-cooked food and less restaurant, buffet, and fast food.  When eating  at a restaurant, ask that your food be prepared with less salt or no salt, if possible. What foods are recommended? The items listed may not be a complete list. Talk with your dietitian about what dietary choices are best for you. Grains Whole-grain or whole-wheat bread. Whole-grain or whole-wheat pasta. Brown rice. [place]. Bulgur. Whole-grain and low-sodium cereals. Pita bread. Low-fat, low-sodium crackers. Whole-wheat flour tortillas. Vegetables Fresh or frozen vegetables (raw, steamed, roasted, or grilled). Low-sodium or reduced-sodium tomato and  vegetable juice. Low-sodium or reduced-sodium tomato sauce and tomato paste. Low-sodium or reduced-sodium canned vegetables. Fruits All fresh, dried, or frozen fruit. Canned fruit in natural juice (without added sugar). Meat and other protein foods Skinless chicken or [place]. Ground chicken or [place]. Pork with fat trimmed off. Fish and seafood. Egg whites. Dried beans, peas, or lentils. Unsalted nuts, nut butters, and seeds. Unsalted canned beans. Lean cuts of beef with fat trimmed off. Low-sodium, lean deli meat. Dairy Low-fat (1%) or fat-free (skim) milk. Fat-free, low-fat, or reduced-fat cheeses. Nonfat, low-sodium ricotta or cottage cheese. Low-fat or nonfat yogurt. Low-fat, low-sodium cheese. Fats and oils Soft margarine without trans fats. Vegetable oil. Low-fat, reduced-fat, or light mayonnaise and salad dressings (reduced-sodium). Canola, safflower, olive, soybean, and sunflower oils. Avocado. Seasoning and other foods Herbs. Spices. Seasoning mixes without salt. Unsalted popcorn and pretzels. Fat-free sweets. What foods are not recommended? The items listed may not be a complete list. Talk with your dietitian about what dietary choices are best for you. Grains Baked goods made with fat, such as croissants, muffins, or some breads. Dry pasta or rice meal packs. Vegetables Creamed or fried vegetables. Vegetables in a cheese sauce. Regular canned vegetables (not low-sodium or reduced-sodium). Regular canned tomato sauce and paste (not low-sodium or reduced-sodium). Regular tomato and vegetable juice (not low-sodium or reduced-sodium). Angie Fava. Olives. Fruits Canned fruit in a light or heavy syrup. Fried fruit. Fruit in cream or butter sauce. Meat and other protein foods Fatty cuts of meat. Ribs. Fried meat. Berniece Salines. Sausage. Bologna and other processed lunch meats. Salami. Fatback. Hotdogs. Bratwurst. Salted nuts and seeds. Canned beans with added salt. Canned or smoked fish. Whole eggs or  egg yolks. Chicken or [place] with skin. Dairy Whole or 2% milk, cream, and half-and-half. Whole or full-fat cream cheese. Whole-fat or sweetened yogurt. Full-fat cheese. Nondairy creamers. Whipped toppings. Processed cheese and cheese spreads. Fats and oils Butter. Stick margarine. Lard. Shortening. Ghee. Bacon fat. Tropical oils, such as coconut, palm kernel, or palm oil. Seasoning and other foods Salted popcorn and pretzels. Onion salt, garlic salt, seasoned salt, table salt, and sea salt. Worcestershire sauce. Tartar sauce. Barbecue sauce. Teriyaki sauce. Soy sauce, including reduced-sodium. Steak sauce. Canned and packaged gravies. Fish sauce. Oyster sauce. Cocktail sauce. Horseradish that you find on the shelf. Ketchup. Mustard. Meat flavorings and tenderizers. Bouillon cubes. Hot sauce and Tabasco sauce. Premade or packaged marinades. Premade or packaged taco seasonings. Relishes. Regular salad dressings. Where to find more information:  National Heart, Lung, and [place]: https://wilson-eaton.com/  American Heart Association: www.heart.org Summary  The DASH eating plan is a healthy eating plan that has been shown to reduce high blood pressure (hypertension). It may also reduce your risk for type 2 diabetes, heart disease, and stroke.  With the DASH eating plan, you should limit salt (sodium) intake to 2,300 mg a day. If you have hypertension, you may need to reduce your sodium intake to 1,500 mg a day.  When on the DASH eating plan, aim to  eat more fresh fruits and vegetables, whole grains, lean proteins, low-fat dairy, and heart-healthy fats.  Work with your health care provider or diet and nutrition specialist (dietitian) to adjust your eating plan to your individual calorie needs. This information is not intended to replace advice given to you by your health care provider. Make sure you discuss any questions you have with your health care provider. Document Released: 11/28/2011  Document Revised: 12/02/2016 Document Reviewed: 12/02/2016 Elsevier Interactive Patient Education  [employer].

## 2018-06-22 ENCOUNTER — Encounter (INDEPENDENT_AMBULATORY_CARE_PROVIDER_SITE_OTHER): Payer: Self-pay

## 2018-07-31 ENCOUNTER — Ambulatory Visit: Payer: Self-pay | Admitting: Family Medicine

## 2018-10-06 ENCOUNTER — Other Ambulatory Visit: Payer: Self-pay | Admitting: Family Medicine

## 2018-10-14 ENCOUNTER — Encounter: Payer: Self-pay | Admitting: Family Medicine

## 2018-10-14 ENCOUNTER — Ambulatory Visit: Payer: Self-pay | Admitting: Family Medicine

## 2018-10-14 VITALS — BP 120/77 | HR 74 | Temp 98.9°F | Resp 20 | Ht 64.0 in | Wt 213.0 lb

## 2018-10-14 DIAGNOSIS — K529 Noninfective gastroenteritis and colitis, unspecified: Secondary | ICD-10-CM

## 2018-10-14 MED ORDER — AZITHROMYCIN 500 MG PO TABS: ORAL_TABLET | ORAL | 0 refills | Status: DC

## 2018-10-14 MED ORDER — DICYCLOMINE HCL 20 MG PO TABS: 20.0000 mg | ORAL_TABLET | Freq: Three times a day (TID) | ORAL | 0 refills | Status: DC

## 2018-10-14 MED ORDER — ONDANSETRON HCL 4 MG PO TABS: 4.0000 mg | ORAL_TABLET | Freq: Three times a day (TID) | ORAL | 0 refills | Status: DC | PRN

## 2018-10-14 NOTE — Progress Notes (Signed)
Robin Suarez , May 14, 1996, 22 y.o., female MRN: [id number] Patient Care Team    Relationship Specialty Notifications Start End  Bradd Canary, MD PCP - General Family Medicine  11/11/11     Chief Complaint  Patient presents with  . Nausea    vomiting,diarrhea x 3 days     Subjective: Pt presents for an OV with complaints of nausea and diarrhea of 3 days duration.  Associated symptoms include loose stools, low grade fever and cramping. She reports having about 5-6 non-bloody stools a day for 2 days. Today she has not had any BM. She denies recent travel, sick contacts or consuming under prepared meals. She is concerned she may have contracted salmonella from handling her reptiles.   No flowsheet data found.  Allergies  Allergen Reactions  . Cefprozil Other (See Comments)    RASH HEAD TO TOE  . Nickel     Rash with contact  . Sulfa Antibiotics     NAUSEA, GI UPSET   Social History   Tobacco Use  . Smoking status: Never Smoker  . Smokeless tobacco: Never Used  Substance Use Topics  . Alcohol use: No   Past Medical History:  Diagnosis Date  . Anxiety and depression 11/11/2011  . Chicken pox as a child  . Contact dermatitis 06/04/2012   TO NICKLE  . Gallstones   . GERD (gastroesophageal reflux disease)   . 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 Migraine 04/16/2016  . Nickel allergy   . Obesity 11/11/2011   PT HAS LOST AT LEAST 100 LBS OVER PAST 9 TO 10 MONTHS-NO LONGER OBESE  . Overactive bladder    NO LONGER A PROBLEM  . PONV (postoperative nausea and vomiting)    NAUSEA AFTER ONE SURGERY AS A CHILD  . Sinusitis 01/29/2013  . Social anxiety disorder 11/11/2011  . Vesico-ureteral reflux    BLADDER REFLUX AS A CHILD - NO PROBLEM NOW   Past Surgical History:  Procedure Laterality Date  . broken arm     left, repair of ulna and radius  . CHOLECYSTECTOMY N/A 08/09/2014   Procedure: LAPAROSCOPIC CHOLECYSTECTOMY WITH INTRAOPERATIVE CHOLANGIOGRAM;  Surgeon: Atilano Ina, MD;  Location: [facility];  Service: General;  Laterality: N/A;  . tubes in ears  22 yr old  . WISDOM TOOTH EXTRACTION  NOV 2013   Family History  Problem Relation Age of Onset  . Diabetes Father        type 2  . Other Father        muscular spasm of the heart  . Hyperlipidemia Father   . Hyperlipidemia Maternal Grandmother   . Hypertension Maternal Grandmother   . Diabetes Maternal Grandmother        type 2  . Other Paternal Grandmother        arrythmia  . Diabetes Paternal Grandmother        type 2  . Diabetes Paternal Grandfather        type 2  . Hyperlipidemia Paternal Grandfather   . Other Paternal Grandfather        fluid around the heart  . Cancer Maternal Grandfather        laryngeal, alcohol and tobacco   Allergies as of 10/14/2018      Reactions   Cefprozil Other (See Comments)   RASH HEAD TO TOE   Nickel    Rash with contact   Sulfa Antibiotics    NAUSEA, GI UPSET      Medication  List        Accurate as of 10/14/18  1:36 PM. Always use your most recent med list.          cetirizine 10 MG tablet Commonly known as:  ZYRTEC Take 1 tablet (10 mg total) by mouth 2 (two) times daily as needed for allergies.   JUNEL FE 1/20 1-20 MG-MCG tablet Generic drug:  norethindrone-ethinyl estradiol TAKE 1 TABLET BY MOUTH EVERY DAY   multivitamin tablet Take 1 tablet by mouth daily.   phentermine 37.5 MG tablet Commonly known as:  ADIPEX-P Take 1 tablet by mouth daily.       All past medical history, surgical history, allergies, family history, immunizations andmedications were updated in the EMR today and reviewed under the history and medication portions of their EMR.     ROS: Negative, with the exception of above mentioned in HPI   Objective:  BP 120/77 (BP Location: Left Arm, Patient Position: Sitting, Cuff Size: Large)   Pulse 74   Temp 98.9 F (37.2 C)   Resp 20   Ht 5\' 4"  (1.626 m)   Wt 213 lb (96.6 kg)   LMP 10/05/2018   SpO2 96%    BMI 36.56 kg/m  Body mass index is 36.56 kg/m. Gen: Afebrile. No acute distress. Nontoxic in appearance, well developed, well nourished.  HENT: AT. [place]. Bilateral TM visualized WNL. MMM, no oral lesions. Bilateral nares without erythema or fullness. Throat without erythema or exudates. No cough.  Eyes:Pupils Equal Round Reactive to light, Extraocular movements intact,  Conjunctiva without redness, discharge or icterus. Neck/lymp/endocrine: Supple,no lymphadenopathy CV: RRR  Chest: CTAB, no wheeze or crackles. Good air movement, normal resp effort.  Abd: Soft. obese. NTND. BS present. no Masses palpated. No rebound or guarding.  Skin: no rashes, purpura or petechiae.  Neuro:  Normal gait. PERLA. EOMi. Alert. Oriented x3   No exam data present No results found. No results found for this or any previous visit (from the past 24 hour(s)).  Assessment/Plan: Parrish E Shaver is a 22 y.o. female present for OV for  Gastroenteritis Discussed course of gastroenteritis and treatment options.  Parke Simmers diet next 48 hours. Avoid dairy products.  - Zofran and bentyl prescribed with instructions on proper use scheduled next 48 hours to allow hydration/nutrients, then PRN. - HYDRATE with water and G2.  - Azith prescribed and printed, to use only if symptoms do not resolve in 2-3 days.Typically even if salmonella antibiotics are not needed unless prolonged illness. She understands.   Reviewed expectations re: course of current medical issues.  Discussed self-management of symptoms.  Outlined signs and symptoms indicating need for more acute intervention.  Patient verbalized understanding and all questions were answered.  Patient received an After-Visit Summary.    No orders of the defined types were placed in this encounter.    Note is dictated utilizing voice recognition software. Although note has been proof read prior to signing, occasional typographical errors still can be missed. If  any questions arise, please do not hesitate to call for verification.   electronically signed by:  Felix Pacini, DO  [facility] - OR

## 2018-10-14 NOTE — Patient Instructions (Signed)
Start zofran about 30 min before meals with bentyl.  Bland diet for 48 hours, then can slowly return to normal diet.  Avoid milk products.  Avoid high sugar drinks--> water and G2 at least 80-100 ounces a day.  If diarrhea returns, start azithromycin as prescribed.    Salmonella Gastroenteritis, Adult Salmonella gastroenteritis is an infection of the intestines that can cause nausea, vomiting, and other symptoms. The infection usually lasts from 2 to 7 days. What are the causes? This condition is caused by salmonella bacteria. These bacteria can spread through a person's stool. You can get this infection by:  Eating food or drinking liquids that have the bacteria on it.  Drinking polluted, standing water.  Coming into contact with an animal that is carrying the bacteria.  What increases the risk? This condition is more likely to develop in:  Elderly adults.  People with a weakened immune system.  People with poor personal or kitchen hygiene.  What are the signs or symptoms? Symptoms of this condition include:  Nausea.  Vomiting.  Abdominal pain or cramps.  Diarrhea, which may be bloody.  Fever.  A headache.  How is this diagnosed? This condition may be diagnosed based on your medical history, a physical exam, and a blood or stool test. How is this treated? Often, the only treatment needed is to drink plenty of fluids. Drinking fluids is important because this infection can make you dehydrated. If your condition is severe, you may be prescribed an antibiotic medicine to help shorten the illness. Most people recover completely, but some people may develop lasting problems such as arthritis, irritation of the eyes, or painful urination. Follow these instructions at home: Drinking  Drink enough fluid to keep your urine clear or pale yellow. This helps prevent dehydration.  Until your diarrhea, nausea, or vomiting is under control, drink only clear liquids. Clear  liquids are liquids you can see through, such as water, broth, or non-caffeinated tea. Avoid milk, fruit juice, alcohol, and very hot or very cold drinks.  If you are dehydrated, ask your health care provider for specific rehydration instructions. Signs of dehydration include: ? Thirst. ? Dry lips or mouth. ? Dry, warm skin. ? Dark urine. ? A headache. Eating and drinking  If you are not hungry, do not force yourself to eat.  If you are hungry, eat a well-balanced diet that includes: ? A variety of complex carbohydrates, such as rice, wheat, potatoes, and bread. ? Lean meats. ? Yogurt. ? Fruits. ? Vegetables.  Avoid high-fat foods. They are difficult to digest.  If you have diarrhea, do not prepare food. Medicines  Take over-the-counter and prescription medicines only as told by your health care provider.  If you were prescribed an antibiotic medicine, take it as told by your health care provider. Do not stop taking the antibiotic even if you start to feel better. Other Instructions   Wash your hands well. This helps keep the bacteria from spreading to others.  Keep track of changes in your weight. Losing a lot of weight can be a sign of a serious problem. Ask your health care provider how much weight loss should concern you.  Keep all follow-up visits as told by your health care provider. How is this prevented? To prevent future salmonella infections:  Handle meat, eggs, seafood, and poultry properly.  Always cook meat, eggs, seafood, and poultry thoroughly.  Wash your hands and counters thoroughly after handling or preparing meat, eggs, seafood, and poultry.  Wash  your hands thoroughly after handling animals.  Get help right away if:  You cannot keep fluids down.  You keep vomiting or having diarrhea.  You have abdominal pain that gets worse.  You have abdominal pain in one small area.  Your diarrhea has more blood or mucus than before.  You have a  fever.  You have any symptoms of severe dehydration. These include: ? Extreme thirst. ? Confusion. ? Inability to sweat. ? Fainting. ? Dizziness. ? Weight loss. This information is not intended to replace advice given to you by your health care provider. Make sure you discuss any questions you have with your health care provider. Document Released: 12/06/2000 Document Revised: 05/16/2016 Document Reviewed: 09/02/2015 Elsevier Interactive Patient Education  [employer].

## 2018-11-27 ENCOUNTER — Encounter: Payer: Self-pay | Admitting: Family Medicine

## 2018-11-27 ENCOUNTER — Other Ambulatory Visit: Payer: Self-pay

## 2018-11-27 ENCOUNTER — Ambulatory Visit: Payer: Self-pay | Admitting: Family Medicine

## 2018-11-27 VITALS — BP 120/80 | HR 65 | Temp 98.3°F | Resp 14 | Ht 63.0 in | Wt 214.0 lb

## 2018-11-27 DIAGNOSIS — R194 Change in bowel habit: Secondary | ICD-10-CM

## 2018-11-27 DIAGNOSIS — R1084 Generalized abdominal pain: Secondary | ICD-10-CM

## 2018-11-27 DIAGNOSIS — Z23 Encounter for immunization: Secondary | ICD-10-CM

## 2018-11-27 DIAGNOSIS — R195 Other fecal abnormalities: Secondary | ICD-10-CM

## 2018-11-27 MED ORDER — DICYCLOMINE HCL 20 MG PO TABS: 20.0000 mg | ORAL_TABLET | Freq: Three times a day (TID) | ORAL | 0 refills | Status: DC

## 2018-11-27 NOTE — Progress Notes (Signed)
Robin Suarez , 05-31-1996, 22 y.o., female MRN: [id number] Patient Care Team    Relationship Specialty Notifications Start End  Bradd Canary, MD PCP - General Family Medicine  11/11/11     Chief Complaint  Patient presents with  . Abdominal Pain    1 month of consistent pain.   Eating a bland diet due to most foods causing pain.  Denies vomiting, however has had dificulty with bowels.      Subjective: Pt presents for an OV with complaints of 1 month with consistent abdominal discomfort, early satiety, intermittent diarrhea with brown watery stools.  Diarrhea and abdominal cramping does occur after eating.  She does endorse 1 of her trigger seems to be fat greasy foods and beef.  Endorses becoming flushed when consuming gluten products.  He was seen approximately 6 weeks ago for diarrhea symptoms and was concern for Salmonella exposure secondary to handling reptiles.  She was treated as a possible gastroenteritis and provided with a prescription of azithromycin in the event her symptoms did not improve after 1 week.  She did take the azithromycin.  She was also prescribed Zofran and Bentyl at that time, she admits that the Bentyl does help if she takes before meals.  He has a history of gallstones-cholecystectomy in 2015.  She has not lost any weight since her appointment in October.  There is no known history of irritable bowel disease or colon cancer in her family.  Her paternal grandmother was just diagnosed with pancreatic cancer at the age of 47.  She is on phentermine 37.5 mg daily, this has been a med she is taken for a while and has never has symptoms in the past for her.  Prescribed Junel birth control, this medication has also not changed over this time.  She also admits to a intermittent rash on her back. No flowsheet data found.  Allergies  Allergen Reactions  . Cefprozil Other (See Comments)    RASH HEAD TO TOE  . Nickel     Rash with contact  . Sulfa Antibiotics    NAUSEA, GI UPSET   Social History   Tobacco Use  . Smoking status: Never Smoker  . Smokeless tobacco: Never Used  Substance Use Topics  . Alcohol use: No   Past Medical History:  Diagnosis Date  . Anxiety and depression 11/11/2011  . Chicken pox as a child  . Contact dermatitis 06/04/2012   TO NICKLE  . Gallstones   . GERD (gastroesophageal reflux disease)   . 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 Migraine 04/16/2016  . Nickel allergy   . Obesity 11/11/2011   PT HAS LOST AT LEAST 100 LBS OVER PAST 9 TO 10 MONTHS-NO LONGER OBESE  . Overactive bladder    NO LONGER A PROBLEM  . PONV (postoperative nausea and vomiting)    NAUSEA AFTER ONE SURGERY AS A CHILD  . Sinusitis 01/29/2013  . Social anxiety disorder 11/11/2011  . Vesico-ureteral reflux    BLADDER REFLUX AS A CHILD - NO PROBLEM NOW   Past Surgical History:  Procedure Laterality Date  . broken arm     left, repair of ulna and radius  . CHOLECYSTECTOMY N/A 08/09/2014   Procedure: LAPAROSCOPIC CHOLECYSTECTOMY WITH INTRAOPERATIVE CHOLANGIOGRAM;  Surgeon: Atilano Ina, MD;  Location: [facility];  Service: General;  Laterality: N/A;  . tubes in ears  22 yr old  . WISDOM TOOTH EXTRACTION  NOV 2013   Family History  Problem Relation Age of  Onset  . Diabetes Father        type 2  . Other Father        muscular spasm of the heart  . Hyperlipidemia Father   . Hyperlipidemia Maternal Grandmother   . Hypertension Maternal Grandmother   . Diabetes Maternal Grandmother        type 2  . Other Paternal Grandmother        arrythmia  . Diabetes Paternal Grandmother        type 2  . Diabetes Paternal Grandfather        type 2  . Hyperlipidemia Paternal Grandfather   . Other Paternal Grandfather        fluid around the heart  . Cancer Maternal Grandfather        laryngeal, alcohol and tobacco   Allergies as of 11/27/2018      Reactions   Cefprozil Other (See Comments)   RASH HEAD TO TOE   Nickel    Rash with contact   Sulfa  Antibiotics    NAUSEA, GI UPSET      Medication List        Accurate as of 11/27/18  2:58 PM. Always use your most recent med list.          cetirizine 10 MG tablet Commonly known as:  ZYRTEC Take 1 tablet (10 mg total) by mouth 2 (two) times daily as needed for allergies.   dicyclomine 20 MG tablet Commonly known as:  BENTYL Take 1 tablet (20 mg total) by mouth 4 (four) times daily -  before meals and at bedtime.   fluticasone 50 MCG/ACT nasal spray Commonly known as:  FLONASE Place into both nostrils daily.   JUNEL FE 1/20 1-20 MG-MCG tablet Generic drug:  norethindrone-ethinyl estradiol TAKE 1 TABLET BY MOUTH EVERY DAY   multivitamin tablet Take 1 tablet by mouth daily.   phentermine 37.5 MG tablet Commonly known as:  ADIPEX-P Take 1 tablet by mouth daily.   Vitamin D 50 MCG (2000 UT) tablet Take 2,000 Units by mouth daily.       All past medical history, surgical history, allergies, family history, immunizations andmedications were updated in the EMR today and reviewed under the history and medication portions of their EMR.     ROS: Negative, with the exception of above mentioned in HPI   Objective:  BP 120/80   Pulse 65   Temp 98.3 F (36.8 C) (Oral)   Resp 14   Ht 5\' 3"  (1.6 m)   Wt 214 lb (97.1 kg)   SpO2 98%   BMI 37.91 kg/m  Body mass index is 37.91 kg/m. Gen: Afebrile. No acute distress. Nontoxic in appearance, well developed, well nourished.  Pleasant, obese, Caucasian female. HENT: AT. [place].  MMM, Eyes:Pupils Equal Round Reactive to light, Extraocular movements intact,  Conjunctiva without redness, discharge or icterus. Neck/lymp/endocrine: Supple,no lymphadenopathy, no thyromegaly CV: RRR no murmur, no edema Chest: CTAB, no wheeze or crackles. Good air movement, normal resp effort.  Abd: Soft. NTND. BS present.  No masses palpated. No rebound or guarding. Skin: no rashes, purpura or petechiae.  Neuro: Normal gait. PERLA. EOMi. Alert.  Oriented x3  Psych: Normal affect, dress and demeanor. Normal speech. Normal thought content and judgment.  No exam data present No results found. No results found for this or any previous visit (from the past 24 hour(s)).  Assessment/Plan: Robin Suarez is a 22 y.o. female present for OV for  Need for immunization  against influenza - Flu Vaccine QUAD 36+ mos IM  Bowel habit changes/loose stools/abdominal discomfort -She with high gluten meals and intermittent rash of her back may indicate a gluten sensitivity.  Patient was encouraged to try to avoid gluten-free diet over the next few weeks to see if it improves her symptoms while we await lab results. -Will rule out other potential infectious causes with CBC, thyroid disorder and inflammatory markers as well as a lipase. -Consider phentermine as a possible cause, although she has been on this medication in the past without issues. - H. Pylori to be collected on Monday. - Gliadin antibodies, serum - Tissue transglutaminase, IgA - Reticulin Antibody, IgA w reflex titer - CBC w/Diff - TSH - Sedimentation rate - C-reactive protein - Lipase - Stool studies if blood work up negative.  - referral to GI if work up negative- consider SIBO as possibility     Reviewed expectations re: course of current medical issues.  Discussed self-management of symptoms.  Outlined signs and symptoms indicating need for more acute intervention.  Patient verbalized understanding and all questions were answered.  Patient received an After-Visit Summary.    No orders of the defined types were placed in this encounter.    Note is dictated utilizing voice recognition software. Although note has been proof read prior to signing, occasional typographical errors still can be missed. If any questions arise, please do not hesitate to call for verification.   electronically signed by:  Felix Pacini, DO  [facility] - [place]

## 2018-11-27 NOTE — Patient Instructions (Addendum)
We will call you with labs once available and if needed order additional testing.  I called in the bentyl for you.    Celiac Disease Celiac disease is an allergy to the protein that is called gluten. When a person with celiac disease eats a food that has gluten in it, his or her natural defense system (immune system) attacks the cells that line the small intestine. Over time, this reaction damages the small intestine and makes the small intestine unable to absorb nutrients from food. Gluten is found in wheat, rye, and barley and in foods like pasta, pizza, and cereal. Celiac disease is also known as celiac sprue, nontropical sprue, and gluten-sensitive enteropathy. What are the causes? This condition is caused by a gene that is passed down through families (inherited). What increases the risk? This condition is more likely to develop in people who have a family member with the disease. What are the signs or symptoms? Symptoms of this condition include:  Recurring bloating and pain in the abdomen.  Gas.  Long-term (chronic) diarrhea.  Pale, bad-smelling, greasy, or oily stool.  Weight loss.  Missed menstrual periods.  Weakening bones (osteoporosis).  Fatigue and weakness.  Tingling or other signs of nerve damage.  Depression.  Poor appetite.  Rash.  In some cases, there are no symptoms. How is this diagnosed? This condition is diagnosed with a physical exam and tests. Tests may include:  Blood tests to check for nutritional deficiencies.  Blood tests to look for evidence that the body is attacking cells in the small intestine.  A test in which a sample of tissue is taken from the small bowel and examined under a microscope (biopsy).  X-rays of the bowel.  Stool tests.  Tests to check for nutrient absorption from the intestine.  How is this treated? There is no cure for this condition, but it can be managed with a gluten-free diet. Treatment may also involve  avoiding dairy foods, such as milk and cheese, because they are hard to digest. Most people who follow a gluten-free diet feel better and stop having symptoms. The intestine usually heals within 3 months to 2 years. In a small percentage of people, this condition does not improve on the gluten-free diet. If your condition does not improve, more tests will be done. You will also need to work with a specialist in celiac disease to find the best treatment for you. Follow these instructions at home:  Follow instructions from your health care provider about diet.  Monitor your body's response to the gluten-free diet. Write down any changes in your symptoms and changes in how you feel.  If you decide to eat outside of the home, prepare your meal ahead of time, or make sure that the place where you are going has gluten-free options.  Keep all follow-up visits as told by your health care provider. This is important.  Suggest to family members that they get screened for early signs of the disease. Contact a health care provider if:  You continue to have symptoms, even when you are eating a gluten-free diet.  You have trouble sticking to the gluten-free diet.  You develop an itchy rash with groups of tiny blisters.  You develop severe weakness.  You develop balance problems.  You develop new symptoms. This information is not intended to replace advice given to you by your health care provider. Make sure you discuss any questions you have with your health care provider. Document Released: 12/09/2005 Document Revised:  05/16/2016 Document Reviewed: 04/03/2015 Elsevier Interactive Patient Education  2018 Elsevier Inc.  Irritable Bowel Syndrome, Adult Irritable bowel syndrome (IBS) is not one specific disease. It is a group of symptoms that affects the organs responsible for digestion (gastrointestinal or GI tract). To regulate how your GI tract works, your body sends signals back and forth between  your intestines and your brain. If you have IBS, there may be a problem with these signals. As a result, your GI tract does not function normally. Your intestines may become more sensitive and overreact to certain things. This is especially true when you eat certain foods or when you are under stress. There are four types of IBS. These may be determined based on the consistency of your stool:  IBS with diarrhea.  IBS with constipation.  Mixed IBS.  Unsubtyped IBS.  It is important to know which type of IBS you have. Some treatments are more likely to be helpful for certain types of IBS. What are the causes? The exact cause of IBS is not known. What increases the risk? You may have a higher risk of IBS if:  You are a woman.  You are younger than 24 years old.  You have a family history of IBS.  You have mental health problems.  You have had bacterial infection of your GI tract.  What are the signs or symptoms? Symptoms of IBS vary from person to person. The main symptom is abdominal pain or discomfort. Additional symptoms usually include one or more of the following:  Diarrhea, constipation, or both.  Abdominal swelling or bloating.  Feeling full or sick after eating a small or regular-size meal.  Frequent gas.  Mucus in the stool.  A feeling of having more stool left after a bowel movement.  Symptoms tend to come and go. They may be associated with stress, psychiatric conditions, or nothing at all. How is this diagnosed? There is no specific test to diagnose IBS. Your health care provider will make a diagnosis based on a physical exam, medical history, and your symptoms. You may have other tests to rule out other conditions that may be causing your symptoms. These may include:  Blood tests.  X-rays.  CT scan.  Endoscopy and colonoscopy. This is a test in which your GI tract is viewed with a long, thin, flexible tube.  How is this treated? There is no cure for  IBS, but treatment can help relieve symptoms. IBS treatment often includes:  Changes to your diet, such as: ? Eating more fiber. ? Avoiding foods that cause symptoms. ? Drinking more water. ? Eating regular, medium-sized portioned meals.  Medicines. These may include: ? Fiber supplements if you have constipation. ? Medicine to control diarrhea (antidiarrheal medicines). ? Medicine to help control muscle spasms in your GI tract (antispasmodic medicines). ? Medicines to help with any mental health issues, such as antidepressants or tranquilizers.  Therapy. ? Talk therapy may help with anxiety, depression, or other mental health issues that can make IBS symptoms worse.  Stress reduction. ? Managing your stress can help keep symptoms under control.  Follow these instructions at home:  Take medicines only as directed by your health care provider.  Eat a healthy diet. ? Avoid foods and drinks with added sugar. ? Include more whole grains, fruits, and vegetables gradually into your diet. This may be especially helpful if you have IBS with constipation. ? Avoid any foods and drinks that make your symptoms worse. These may include dairy products  and caffeinated or carbonated drinks. ? Do not eat large meals. ? Drink enough fluid to keep your urine clear or pale yellow.  Exercise regularly. Ask your health care provider for recommendations of good activities for you.  Keep all follow-up visits as directed by your health care provider. This is important. Contact a health care provider if:  You have constant pain.  You have trouble or pain with swallowing.  You have worsening diarrhea. Get help right away if:  You have severe and worsening abdominal pain.  You have diarrhea and: ? You have a rash, stiff neck, or severe headache. ? You are irritable, sleepy, or difficult to awaken. ? You are weak, dizzy, or extremely thirsty.  You have bright red blood in your stool or you have  black tarry stools.  You have unusual abdominal swelling that is painful.  You vomit continuously.  You vomit blood (hematemesis).  You have both abdominal pain and a fever. This information is not intended to replace advice given to you by your health care provider. Make sure you discuss any questions you have with your health care provider. Document Released: 12/09/2005 Document Revised: 05/10/2016 Document Reviewed: 08/26/2014 Elsevier Interactive Patient Education  2018 [employer].  Diet for Irritable Bowel Syndrome When you have irritable bowel syndrome (IBS), the foods you eat and your eating habits are very important. IBS may cause various symptoms, such as abdominal pain, constipation, or diarrhea. Choosing the right foods can help ease discomfort caused by these symptoms. Work with your health care provider and dietitian to find the best eating plan to help control your symptoms. What general guidelines do I need to follow?  Keep a food diary. This will help you identify foods that cause symptoms. Write down: ? What you eat and when. ? What symptoms you have. ? When symptoms occur in relation to your meals.  Avoid foods that cause symptoms. Talk with your dietitian about other ways to get the same nutrients that are in these foods.  Eat more foods that contain fiber. Take a fiber supplement if directed by your dietitian.  Eat your meals slowly, in a relaxed setting.  Aim to eat 5-6 small meals per day. Do not skip meals.  Drink enough fluids to keep your urine clear or pale yellow.  Ask your health care provider if you should take an over-the-counter probiotic during flare-ups to help restore healthy gut bacteria.  If you have cramping or diarrhea, try making your meals low in fat and high in carbohydrates. Examples of carbohydrates are pasta, rice, whole grain breads and cereals, fruits, and vegetables.  If dairy products cause your symptoms to flare up, try eating  less of them. You might be able to handle yogurt better than other dairy products because it contains bacteria that help with digestion. What foods are not recommended? The following are some foods and drinks that may worsen your symptoms:  Fatty foods, such as [place] fries.  Milk products, such as cheese or ice cream.  Chocolate.  Alcohol.  Products with caffeine, such as coffee.  Carbonated drinks, such as soda.  The items listed above may not be a complete list of foods and beverages to avoid. Contact your dietitian for more information. What foods are good sources of fiber? Your health care provider or dietitian may recommend that you eat more foods that contain fiber. Fiber can help reduce constipation and other IBS symptoms. Add foods with fiber to your diet a little at a  time so that your body can get used to them. Too much fiber at once might cause gas and swelling of your abdomen. The following are some foods that are good sources of fiber:  Apples.  Peaches.  Pears.  Berries.  Figs.  Broccoli (raw).  Cabbage.  Carrots.  Raw peas.  Kidney beans.  Lima beans.  Whole grain bread.  Whole grain cereal.  Where to find more information: [employer] for Functional Gastrointestinal Disorders: www.iffgd.[employer] National Institute of Diabetes and Digestive and Kidney Diseases: http://norris-lawson.com/www.niddk.nih.gov/health-information/health-topics/digestive-diseases/ibs/Pages/facts.aspx This information is not intended to replace advice given to you by your health care provider. Make sure you discuss any questions you have with your health care provider. Document Released: 02/29/2004 Document Revised: 05/16/2016 Document Reviewed: 03/11/2014 Elsevier Interactive Patient Education  2018 [employer]

## 2018-11-28 LAB — COMPREHENSIVE METABOLIC PANEL
AG Ratio: 2 (calc) (ref 1.0–2.5)
ALT: 26 U/L (ref 6–29)
AST: 20 U/L (ref 10–30)
Albumin: 4.4 g/dL (ref 3.6–5.1)
Alkaline phosphatase (APISO): 62 U/L (ref 33–115)
BUN: 10 mg/dL (ref 7–25)
CO2: 25 mmol/L (ref 20–32)
Calcium: 9.7 mg/dL (ref 8.6–10.2)
Chloride: 105 mmol/L (ref 98–110)
Creat: 1.01 mg/dL (ref 0.50–1.10)
Globulin: 2.2 g/dL (calc) (ref 1.9–3.7)
Glucose, Bld: 103 mg/dL — ABNORMAL HIGH (ref 65–99)
Potassium: 4.1 mmol/L (ref 3.5–5.3)
Sodium: 139 mmol/L (ref 135–146)
Total Bilirubin: 1 mg/dL (ref 0.2–1.2)
Total Protein: 6.6 g/dL (ref 6.1–8.1)

## 2018-11-28 LAB — CBC WITH DIFFERENTIAL/PLATELET
Basophils Absolute: 18 cells/uL (ref 0–200)
Basophils Relative: 0.4 %
Eosinophils Absolute: 59 cells/uL (ref 15–500)
Eosinophils Relative: 1.3 %
HCT: 42.7 % (ref 35.0–45.0)
Hemoglobin: 14.4 g/dL (ref 11.7–15.5)
Lymphs Abs: 1755 cells/uL (ref 850–3900)
MCH: 30.8 pg (ref 27.0–33.0)
MCHC: 33.7 g/dL (ref 32.0–36.0)
MCV: 91.4 fL (ref 80.0–100.0)
MPV: 10.9 fL (ref 7.5–12.5)
Monocytes Relative: 7.8 %
Neutro Abs: 2318 cells/uL (ref 1500–7800)
Neutrophils Relative %: 51.5 %
Platelets: 289 10*3/uL (ref 140–400)
RBC: 4.67 10*6/uL (ref 3.80–5.10)
RDW: 11.7 % (ref 11.0–15.0)
Total Lymphocyte: 39 %
WBC mixed population: 351 cells/uL (ref 200–950)
WBC: 4.5 10*3/uL (ref 3.8–10.8)

## 2018-11-28 LAB — LIPASE: Lipase: 9 U/L (ref 7–60)

## 2018-11-28 LAB — SEDIMENTATION RATE: Sed Rate: 6 mm/h (ref 0–20)

## 2018-11-28 LAB — TSH: TSH: 2.39 mIU/L

## 2018-11-28 LAB — C-REACTIVE PROTEIN: CRP: 3.3 mg/L (ref <8.0)

## 2018-11-30 ENCOUNTER — Telehealth: Payer: Self-pay | Admitting: Family Medicine

## 2018-11-30 ENCOUNTER — Encounter: Payer: Self-pay | Admitting: Family Medicine

## 2018-11-30 DIAGNOSIS — R197 Diarrhea, unspecified: Secondary | ICD-10-CM

## 2018-11-30 LAB — GLIADIN ANTIBODIES, SERUM
GLIADIN IGG: 1 U
Gliadin IgA: 4 Units

## 2018-11-30 LAB — TISSUE TRANSGLUTAMINASE, IGA: (tTG) Ab, IgA: 1 U/mL

## 2018-11-30 LAB — RETICULIN ANTIBODIES, IGA W TITER: RETICULIN IGA SCREEN: NEGATIVE

## 2018-11-30 NOTE — Telephone Encounter (Signed)
Labs are all normal. I ordered the H. Pylori blood test to be collected at her earliest convenience by lab appt and when she has that completed she is to pick up the stool culture kits ordered , complete and then return them.

## 2018-11-30 NOTE — Telephone Encounter (Signed)
LM for patient to call for results.

## 2018-12-01 NOTE — Telephone Encounter (Signed)
Message left on voice mail for patient to return call. 

## 2018-12-01 NOTE — Telephone Encounter (Signed)
Patient notified and appt scheduled.

## 2018-12-04 ENCOUNTER — Other Ambulatory Visit (INDEPENDENT_AMBULATORY_CARE_PROVIDER_SITE_OTHER): Payer: Self-pay

## 2018-12-04 DIAGNOSIS — R197 Diarrhea, unspecified: Secondary | ICD-10-CM

## 2018-12-04 DIAGNOSIS — R1084 Generalized abdominal pain: Secondary | ICD-10-CM

## 2018-12-04 LAB — H. PYLORI ANTIBODY, IGG: H Pylori IgG: NEGATIVE

## 2018-12-07 NOTE — Addendum Note (Signed)
Addended by: Eulah PontALBRIGHT, LISA M on: 12/07/2018 08:03 AM   Modules accepted: Orders

## 2018-12-08 ENCOUNTER — Encounter: Payer: Self-pay | Admitting: Family Medicine

## 2018-12-08 ENCOUNTER — Ambulatory Visit: Payer: Self-pay | Admitting: Family Medicine

## 2018-12-08 VITALS — BP 129/86 | HR 79 | Temp 98.8°F | Resp 16 | Ht 63.0 in | Wt 211.0 lb

## 2018-12-08 DIAGNOSIS — J029 Acute pharyngitis, unspecified: Secondary | ICD-10-CM

## 2018-12-08 LAB — POCT RAPID STREP A (OFFICE): Rapid Strep A Screen: NEGATIVE

## 2018-12-08 MED ORDER — AZITHROMYCIN 250 MG PO TABS: ORAL_TABLET | ORAL | 0 refills | Status: DC

## 2018-12-08 NOTE — Patient Instructions (Addendum)
Start  flonase, mucinex (DM if cough) and nasal saline.  Z- pack prescribed, take until completed--> If started, start  on Friday if not feeling better or culture is positive.  If cough present it can last up to 6-8 weeks.  F/U 2 weeks of not improved.    Pharyngitis Pharyngitis is a sore throat (pharynx). There is redness, pain, and swelling of your throat. Follow these instructions at home:  Drink enough fluids to keep your pee (urine) clear or pale yellow.  Only take medicine as told by your doctor. ? You may get sick again if you do not take medicine as told. Finish your medicines, even if you start to feel better. ? Do not take aspirin.  Rest.  Rinse your mouth (gargle) with salt water ( tsp of salt per 1 qt of water) every 1-2 hours. This will help the pain.  If you are not at risk for choking, you can suck on hard candy or sore throat lozenges. Contact a doctor if:  You have large, tender lumps on your neck.  You have a rash.  You cough up green, yellow-brown, or bloody spit. Get help right away if:  You have a stiff neck.  You drool or cannot swallow liquids.  You throw up (vomit) or are not able to keep medicine or liquids down.  You have very bad pain that does not go away with medicine.  You have problems breathing (not from a stuffy nose). This information is not intended to replace advice given to you by your health care provider. Make sure you discuss any questions you have with your health care provider. Document Released: 05/27/2008 Document Revised: 05/16/2016 Document Reviewed: 08/16/2013 Elsevier Interactive Patient Education  2017 [employer]

## 2018-12-08 NOTE — Progress Notes (Signed)
Robin Suarez , 09-05-1996, 22 y.o., female MRN: [id number] Patient Care Team    Relationship Specialty Notifications Start End  Bradd Canary, MD PCP - General Family Medicine  11/11/11     Chief Complaint  Patient presents with  . Cough    x4 days  . Fever  . Sore Throat     Subjective: Pt presents for an OV with complaints of cough of 4 days duration.  Associated symptoms include fever, chills, sore throat, ear pain discomfort, congestion/runny nose. She denies facial pressure, wheezing or shortness of breath.  Flu shot UTD 2019.  She has been exposed to flu and strep through work.  Pt has tried OTC sinus meds and took advil last night before bed to ease their symptoms.   No flowsheet data found.  Allergies  Allergen Reactions  . Cefprozil Other (See Comments)    RASH HEAD TO TOE  . Nickel     Rash with contact  . Sulfa Antibiotics     NAUSEA, GI UPSET   Social History   Tobacco Use  . Smoking status: Never Smoker  . Smokeless tobacco: Never Used  Substance Use Topics  . Alcohol use: No   Past Medical History:  Diagnosis Date  . Anxiety and depression 11/11/2011  . Chicken pox as a child  . Contact dermatitis 06/04/2012   TO NICKLE  . Gallstones   . GERD (gastroesophageal reflux disease)   . 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 Migraine 04/16/2016  . Nickel allergy   . Obesity 11/11/2011   PT HAS LOST AT LEAST 100 LBS OVER PAST 9 TO 10 MONTHS-NO LONGER OBESE  . Overactive bladder    NO LONGER A PROBLEM  . PONV (postoperative nausea and vomiting)    NAUSEA AFTER ONE SURGERY AS A CHILD  . Sinusitis 01/29/2013  . Social anxiety disorder 11/11/2011  . Vesico-ureteral reflux    BLADDER REFLUX AS A CHILD - NO PROBLEM NOW   Past Surgical History:  Procedure Laterality Date  . broken arm     left, repair of ulna and radius  . CHOLECYSTECTOMY N/A 08/09/2014   Procedure: LAPAROSCOPIC CHOLECYSTECTOMY WITH INTRAOPERATIVE CHOLANGIOGRAM;  Surgeon: Atilano Ina, MD;  Location: [facility];  Service: General;  Laterality: N/A;  . tubes in ears  22 yr old  . WISDOM TOOTH EXTRACTION  NOV 2013   Family History  Problem Relation Age of Onset  . Diabetes Father        type 2  . Other Father        muscular spasm of the heart  . Hyperlipidemia Father   . Hyperlipidemia Maternal Grandmother   . Hypertension Maternal Grandmother   . Diabetes Maternal Grandmother        type 2  . Other Paternal Grandmother        arrythmia  . Diabetes Paternal Grandmother        type 2  . Pancreatic cancer Paternal Grandmother 72  . Diabetes Paternal Grandfather        type 2  . Hyperlipidemia Paternal Grandfather   . Other Paternal Grandfather        fluid around the heart  . Cancer Maternal Grandfather        laryngeal, alcohol and tobacco   Allergies as of 12/08/2018      Reactions   Cefprozil Other (See Comments)   RASH HEAD TO TOE   Nickel    Rash with contact   Sulfa  Antibiotics    NAUSEA, GI UPSET      Medication List       Accurate as of December 08, 2018 11:00 AM. Always use your most recent med list.        cetirizine 10 MG tablet Commonly known as:  ZYRTEC Take 1 tablet (10 mg total) by mouth 2 (two) times daily as needed for allergies.   dicyclomine 20 MG tablet Commonly known as:  BENTYL Take 1 tablet (20 mg total) by mouth 4 (four) times daily -  before meals and at bedtime.   fluticasone 50 MCG/ACT nasal spray Commonly known as:  FLONASE Place into both nostrils daily.   JUNEL FE 1/20 1-20 MG-MCG tablet Generic drug:  norethindrone-ethinyl estradiol TAKE 1 TABLET BY MOUTH EVERY DAY   multivitamin tablet Take 1 tablet by mouth daily.   phentermine 37.5 MG tablet Commonly known as:  ADIPEX-P Take 1 tablet by mouth daily.   Vitamin D 50 MCG (2000 UT) tablet Take 2,000 Units by mouth daily.       All past medical history, surgical history, allergies, family history, immunizations andmedications were updated in the  EMR today and reviewed under the history and medication portions of their EMR.     ROS: Negative, with the exception of above mentioned in HPI  Objective:  BP 129/86 (BP Location: Left Arm, Patient Position: Sitting, Cuff Size: Large)   Pulse 79   Temp 98.8 F (37.1 C) (Oral)   Resp 16   Ht 5\' 3"  (1.6 m)   Wt 211 lb (95.7 kg)   SpO2 100%   BMI 37.38 kg/m  Body mass index is 37.38 kg/m. Gen: Afebrile. No acute distress. Nontoxic in appearance, well developed, well nourished.  HENT: AT. [place]. Bilateral TM visualized bilateral ear fullness. MMM, no oral lesions. Bilateral nares with mild erythema, no swelling or drainage. Throat with erythema, no exudates. Mild cough on exam.  Eyes:Pupils Equal Round Reactive to light, Extraocular movements intact,  Conjunctiva without redness, discharge or icterus. Neck/lymp/endocrine: Supple,no lymphadenopathy CV: RRR, no edema Chest: CTAB, no wheeze or crackles. Good air movement, normal resp effort.  Abd: Soft. NTND. BS present.  Skin: no rashes, purpura or petechiae.  Neuro:  Normal gait. PERLA. EOMi. Alert. Oriented x3  No exam data present No results found. Results for orders placed or performed in visit on 12/08/18 (from the past 24 hour(s))  POCT rapid strep A     Status: Normal   Collection Time: 12/08/18 10:59 AM  Result Value Ref Range   Rapid Strep A Screen Negative Negative    Assessment/Plan: Robin Suarez is a 22 y.o. female present for OV for  Sore throat/ Pharyngitis - POCT rapid strep A - Upper Respiratory Culture Sent - azithromycin (ZITHROMAX) 250 MG tablet; 500 mg QD, 250 mg QD  Dispense: 6 tablet; Refill: 0 Rest, hydrate.  + flonase, mucinex (DM if cough), nettie pot or nasal saline.  Z- pack prescribed, take until completed--> If started- start on Friday if not feeling better or culture is positive.  If cough present it can last up to 6-8 weeks.  F/U 2 weeks of not improved.    Reviewed expectations re: course  of current medical issues.  Discussed self-management of symptoms.  Outlined signs and symptoms indicating need for more acute intervention.  Patient verbalized understanding and all questions were answered.  Patient received an After-Visit Summary.    Orders Placed This Encounter  Procedures  . POCT rapid strep  A    > 25 minutes spent with patient, >50% of time spent face to face    Note is dictated utilizing voice recognition software. Although note has been proof read prior to signing, occasional typographical errors still can be missed. If any questions arise, please do not hesitate to call for verification.   electronically signed by:  Felix Pacinienee Kuneff, DO  [facility] - OR

## 2018-12-10 NOTE — Progress Notes (Signed)
L/M for pt to C/b for lab results.

## 2018-12-11 ENCOUNTER — Telehealth: Payer: Self-pay

## 2018-12-11 LAB — CULTURE, UPPER RESPIRATORY
MICRO NUMBER:: 91510342
SPECIMEN QUALITY:: ADEQUATE

## 2018-12-11 NOTE — Telephone Encounter (Signed)
Pt was seen by Dr. Claiborne BillingsKuneff.

## 2018-12-11 NOTE — Telephone Encounter (Signed)
Pt. Given lab results and instructions. Verbalizes understanding. Sais she started the antibiotic and does feel better.

## 2018-12-16 LAB — STOOL CULTURE
MICRO NUMBER:: 91502592
MICRO NUMBER:: 91502593
MICRO NUMBER:: 91502596
SHIGA RESULT:: NOT DETECTED
SPECIMEN QUALITY: ADEQUATE
SPECIMEN QUALITY:: ADEQUATE
SPECIMEN QUALITY:: ADEQUATE

## 2018-12-16 LAB — OVA AND PARASITE EXAMINATION
CONCENTRATE RESULT: NONE SEEN
MICRO NUMBER:: 91502595
SPECIMEN QUALITY: ADEQUATE
TRICHROME RESULT:: NONE SEEN

## 2018-12-16 LAB — FECAL LACTOFERRIN, QUANT
Fecal Lactoferrin: NEGATIVE
MICRO NUMBER:: 91502594
SPECIMEN QUALITY:: ADEQUATE

## 2018-12-16 LAB — CLOSTRIDIUM DIFFICILE TOXIN B, QUALITATIVE, REAL-TIME PCR: CDIFFPCR: NOT DETECTED

## 2018-12-18 ENCOUNTER — Telehealth: Payer: Self-pay | Admitting: Family Medicine

## 2018-12-18 MED ORDER — SACCHAROMYCES BOULARDII 250 MG PO CAPS: 500.0000 mg | ORAL_CAPSULE | Freq: Two times a day (BID) | ORAL | 0 refills | Status: DC

## 2018-12-18 NOTE — Telephone Encounter (Signed)
Please inform patient the following information: The last of her stool study results have been received and also normal- therefore there is no inflammatory, bacterial/viral infection or parasite infection of her GI tract causing her symptoms.  - Her stool also did not show proper amounts of the body's "normal flora" either. Normal flora helps regulate our bowels and keep them healthy. I have called in a prescription for her to take 2 capsules BID for 4 weeks, then follow up here so we can see if this helps improve her bowel habits by replacing her normal flora for her.

## 2018-12-18 NOTE — Telephone Encounter (Signed)
Pt given results per notes of Dr. Claiborne BillingsKuneff on 12/18/18. Pt verbalized understanding. Pt scheduled for f/u appt with Dr. Claiborne BillingsKuneff on 01/15/19

## 2018-12-18 NOTE — Telephone Encounter (Signed)
Copied from CRM [id number]. Topic: Quick Communication - Lab Results (Clinic Use ONLY) >> Dec 18, 2018 11:39 AM Shelly BombardWood-Regan, Nicole, CMA wrote: Molli KnockOkay to discuss lab results and recommendations regarding pts most recent lab results. >> Dec 18, 2018  1:00 PM Arlyss Gandyichardson, Perley Arthurs N, NT wrote: Pt returning call to receive lab results.

## 2018-12-18 NOTE — Telephone Encounter (Signed)
Okay for P[facility] to discuss labs w pt.

## 2018-12-18 NOTE — Telephone Encounter (Signed)
See 2nd telephone encounter for 12/18/18.

## 2018-12-18 NOTE — Telephone Encounter (Signed)
Lm for pt to cb for lab results

## 2019-01-15 ENCOUNTER — Ambulatory Visit: Payer: Self-pay | Admitting: Family Medicine

## 2019-03-16 ENCOUNTER — Telehealth: Payer: Self-pay | Admitting: Family Medicine

## 2019-03-16 NOTE — Telephone Encounter (Signed)
Patient stated that this morning she went into work and had a temperature of 100.1 but once home it was 98.1. Has a dry cough. Would like to know when she can return to work. Also would like to update her DPR. Would like a call back to discuss work and get a ok for when to return

## 2019-03-17 NOTE — Telephone Encounter (Signed)
Patient calling back to find out how long she has to quarantine and when she can go back to work?

## 2019-03-19 ENCOUNTER — Other Ambulatory Visit: Payer: Self-pay | Admitting: Family Medicine

## 2019-05-20 ENCOUNTER — Encounter: Payer: Self-pay | Admitting: Family Medicine

## 2019-05-21 ENCOUNTER — Other Ambulatory Visit: Payer: Self-pay | Admitting: Family Medicine

## 2019-05-21 MED ORDER — AZITHROMYCIN 250 MG PO TABS: ORAL_TABLET | ORAL | 0 refills | Status: DC

## 2019-05-21 NOTE — Telephone Encounter (Signed)
Spoke with patient about her sinus congestion  She has had sinus congestions for about 2 weeks and OTC meds have not been helping, I offered a virtual visit she mentioned to me she does not have insurance currently and her father possibly had a stroke this am he is being admitted currently.... Please advise

## 2019-09-04 ENCOUNTER — Other Ambulatory Visit: Payer: Self-pay | Admitting: Family Medicine

## 2019-10-22 ENCOUNTER — Other Ambulatory Visit: Payer: Self-pay

## 2019-10-22 DIAGNOSIS — Z20822 Contact with and (suspected) exposure to covid-19: Secondary | ICD-10-CM

## 2019-10-23 ENCOUNTER — Telehealth: Payer: Self-pay

## 2019-10-23 LAB — NOVEL CORONAVIRUS, NAA: SARS-CoV-2, NAA: DETECTED — AB

## 2019-10-23 NOTE — Telephone Encounter (Signed)
Called patient and was unable to leave voicemail due to mailbox being full. Patient is COVID positive

## 2019-10-24 ENCOUNTER — Encounter: Payer: Self-pay | Admitting: Family Medicine

## 2019-10-27 ENCOUNTER — Encounter: Payer: Self-pay | Admitting: Family Medicine

## 2019-10-29 NOTE — Telephone Encounter (Signed)
Spoke with patient she stated she will go to an ER if she feels worst and declined a visit at this time

## 2019-11-03 ENCOUNTER — Encounter: Payer: Self-pay | Admitting: Family Medicine

## 2019-11-08 ENCOUNTER — Encounter: Payer: Self-pay | Admitting: Family Medicine

## 2019-12-29 ENCOUNTER — Encounter: Payer: Self-pay | Admitting: Family Medicine

## 2019-12-30 MED ORDER — NORETHIN ACE-ETH ESTRAD-FE 1-20 MG-MCG PO TABS: 1.0000 | ORAL_TABLET | Freq: Every day | ORAL | 1 refills | Status: DC

## 2020-03-21 LAB — HM PAP SMEAR

## 2020-05-23 ENCOUNTER — Encounter: Payer: Self-pay | Admitting: Family Medicine

## 2020-06-24 ENCOUNTER — Other Ambulatory Visit: Payer: Self-pay | Admitting: Family Medicine

## 2020-08-01 ENCOUNTER — Encounter: Payer: Self-pay | Admitting: Physician Assistant

## 2020-08-01 ENCOUNTER — Other Ambulatory Visit: Payer: Self-pay

## 2020-08-01 ENCOUNTER — Ambulatory Visit (INDEPENDENT_AMBULATORY_CARE_PROVIDER_SITE_OTHER): Payer: Self-pay | Admitting: Physician Assistant

## 2020-08-01 VITALS — BP 120/60 | HR 92 | Temp 98.6°F | Resp 16 | Wt 236.0 lb

## 2020-08-01 DIAGNOSIS — R300 Dysuria: Secondary | ICD-10-CM

## 2020-08-01 LAB — POCT URINALYSIS DIPSTICK
Bilirubin, UA: NEGATIVE
Blood, UA: NEGATIVE
Glucose, UA: NEGATIVE
Ketones, UA: NEGATIVE
Leukocytes, UA: NEGATIVE
Nitrite, UA: NEGATIVE
Protein, UA: NEGATIVE
Spec Grav, UA: 1.02 (ref 1.010–1.025)
Urobilinogen, UA: 0.2 E.U./dL
pH, UA: 6.5 (ref 5.0–8.0)

## 2020-08-01 MED ORDER — NITROFURANTOIN MONOHYD MACRO 100 MG PO CAPS: 100.0000 mg | ORAL_CAPSULE | Freq: Two times a day (BID) | ORAL | 0 refills | Status: DC

## 2020-08-01 NOTE — Progress Notes (Signed)
Patient presents to clinic today c/o 2 days of dysuria, urinary urgency, frequency, hesitancy with urinary odor. Notes a few days of low back pain. Denies flank pain, nausea, vomiting, vaginal symptoms. Has history of UTI and states this feels identical to previous episodes.   Past Medical History:  Diagnosis Date  . Anxiety and depression 11/11/2011  . Chicken pox as a child  . Contact dermatitis 06/04/2012   TO NICKLE  . Gallstones   . GERD (gastroesophageal reflux disease)   . 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 Migraine 04/16/2016  . Nickel allergy   . Obesity 11/11/2011   PT HAS LOST AT LEAST 100 LBS OVER PAST 9 TO 10 MONTHS-NO LONGER OBESE  . Overactive bladder    NO LONGER A PROBLEM  . PONV (postoperative nausea and vomiting)    NAUSEA AFTER ONE SURGERY AS A CHILD  . Sinusitis 01/29/2013  . Social anxiety disorder 11/11/2011  . Vesico-ureteral reflux    BLADDER REFLUX AS A CHILD - NO PROBLEM NOW    Current Outpatient Medications on File Prior to Visit  Medication Sig Dispense Refill  . norethindrone-ethinyl estradiol (JUNEL FE 1/20) 1-20 MG-MCG tablet Take 1 tablet by mouth daily. 84 tablet 1   No current facility-administered medications on file prior to visit.    Allergies  Allergen Reactions  . Cefprozil Other (See Comments)    RASH HEAD TO TOE  . Nickel     Rash with contact  . Sulfa Antibiotics     NAUSEA, GI UPSET    Family History  Problem Relation Age of Onset  . Diabetes Father        type 2  . Other Father        muscular spasm of the heart  . Hyperlipidemia Father   . Hyperlipidemia Maternal Grandmother   . Hypertension Maternal Grandmother   . Diabetes Maternal Grandmother        type 2  . Other Paternal Grandmother        arrythmia  . Diabetes Paternal Grandmother        type 2  . Pancreatic cancer Paternal Grandmother 34  . Diabetes Paternal Grandfather        type 2  . Hyperlipidemia Paternal Grandfather   . Other Paternal  Grandfather        fluid around the heart  . Cancer Maternal Grandfather        laryngeal, alcohol and tobacco    Social History   Socioeconomic History  . Marital status: Single    Spouse name: Not on file  . Number of children: Not on file  . Years of education: Not on file  . Highest education level: Not on file  Occupational History  . Not on file  Tobacco Use  . Smoking status: Never Smoker  . Smokeless tobacco: Never Used  Vaping Use  . Vaping Use: Never used  Substance and Sexual Activity  . Alcohol use: No  . Drug use: No  . Sexual activity: Never  Other Topics Concern  . Not on file  Social History Narrative   Homeschooled, junior HS level.   No sibs.  Lives with mom and dad near [place].   Dad smokes.  Dogs in home.  Well water.         Social Determinants of Health   Financial Resource Strain:   . Difficulty of Paying Living Expenses:   Food Insecurity:   . Worried About Programme researcher, broadcasting/film/video in the  Last Year:   . Ran Out of Food in the Last Year:   Transportation Needs:   . Freight forwarder (Medical):   Marland Kitchen Lack of Transportation (Non-Medical):   Physical Activity:   . Days of Exercise per Week:   . Minutes of Exercise per Session:   Stress:   . Feeling of Stress :   Social Connections:   . Frequency of Communication with Friends and Family:   . Frequency of Social Gatherings with Friends and Family:   . Attends Religious Services:   . Active Member of Clubs or Organizations:   . Attends Banker Meetings:   Marland Kitchen Marital Status:    Review of Systems - See HPI.  All other ROS are negative.  BP 120/60   Pulse 92   Temp 98.6 F (37 C) (Temporal)   Resp 16   Wt 236 lb (107 kg)   SpO2 99%   BMI 41.81 kg/m   Physical Exam Vitals reviewed.  Constitutional:      Appearance: Normal appearance.  HENT:     Head: Normocephalic and atraumatic.  Cardiovascular:     Rate and Rhythm: Normal rate and regular rhythm.     Pulses: Normal  pulses.     Heart sounds: Normal heart sounds.  Pulmonary:     Effort: Pulmonary effort is normal.     Breath sounds: Normal breath sounds.  Abdominal:     General: Bowel sounds are normal.     Palpations: Abdomen is soft.     Tenderness: There is no abdominal tenderness. There is no right CVA tenderness or left CVA tenderness.  Musculoskeletal:     Cervical back: Neck supple.  Neurological:     General: No focal deficit present.     Mental Status: She is alert.     Recent Results (from the past 2160 hour(s))  POCT Urinalysis Dipstick     Status: Normal   Collection Time: 08/01/20  1:18 PM  Result Value Ref Range   Color, UA yellow    Clarity, UA clear    Glucose, UA Negative Negative   Bilirubin, UA negative    Ketones, UA negative    Spec Grav, UA 1.020 1.010 - 1.025   Blood, UA negative    pH, UA 6.5 5.0 - 8.0   Protein, UA Negative Negative   Urobilinogen, UA 0.2 0.2 or 1.0 E.U./dL   Nitrite, UA negative    Leukocytes, UA Negative Negative   Appearance     Odor      Assessment/Plan: 1. Dysuria With low back pain and classic UTI symptoms. No vaginal symptoms or concern for STI. No alarm symptoms present. UA today unremarkable. Giving history of UTI and classic symptoms, will start empiric treatment pending culture results. Is allergic to Cephalosporins and Sulfa. Will Rx macrobid.  - POCT Urinalysis Dipstick - Urine Culture - nitrofurantoin, macrocrystal-monohydrate, (MACROBID) 100 MG capsule; Take 1 capsule (100 mg total) by mouth 2 (two) times daily.  Dispense: 10 capsule; Refill: 0  This visit occurred during the SARS-CoV-2 public health emergency.  Safety protocols were in place, including screening questions prior to the visit, additional usage of staff PPE, and extensive cleaning of exam room while observing appropriate contact time as indicated for disinfecting solutions.     Piedad Climes, PA-C

## 2020-08-01 NOTE — Patient Instructions (Signed)
Your symptoms are consistent with a bladder infection, also called acute cystitis. Please take your antibiotic (Macrobid) as directed until all pills are gone.  Stay very well hydrated.  Consider a daily probiotic (Align, Culturelle, or Activia) to help prevent stomach upset caused by the antibiotic.  Taking a probiotic daily may also help prevent recurrent UTIs.  Also consider taking AZO (Phenazopyridine) tablets to help decrease pain with urination.  I will call you with your urine testing results.  We will change antibiotics if indicated.  Call or return to clinic if symptoms are not resolved by completion of antibiotic.   Urinary Tract Infection A urinary tract infection (UTI) can occur any place along the urinary tract. The tract includes the kidneys, ureters, bladder, and urethra. A type of germ called bacteria often causes a UTI. UTIs are often helped with antibiotic medicine.  HOME CARE   If given, take antibiotics as told by your doctor. Finish them even if you start to feel better.  Drink enough fluids to keep your pee (urine) clear or pale yellow.  Avoid tea, drinks with caffeine, and bubbly (carbonated) drinks.  Pee often. Avoid holding your pee in for a long time.  Pee before and after having sex (intercourse).  Wipe from front to back after you poop (bowel movement) if you are a woman. Use each tissue only once. GET HELP RIGHT AWAY IF:   You have back pain.  You have lower belly (abdominal) pain.  You have chills.  You feel sick to your stomach (nauseous).  You throw up (vomit).  Your burning or discomfort with peeing does not go away.  You have a fever.  Your symptoms are not better in 3 days. MAKE SURE YOU:   Understand these instructions.  Will watch your condition.  Will get help right away if you are not doing well or get worse. Document Released: 05/27/2008 Document Revised: 09/02/2012 Document Reviewed: 07/09/2012 Exit[employer] Patient Information 2015  [employer]. This information is not intended to replace advice given to you by your health care provider. Make sure you discuss any questions you have with your health care provider.   

## 2020-08-02 LAB — URINE CULTURE
MICRO NUMBER:: 10808954
SPECIMEN QUALITY:: ADEQUATE

## 2020-09-22 ENCOUNTER — Ambulatory Visit: Payer: Self-pay | Admitting: Family Medicine

## 2020-11-29 ENCOUNTER — Ambulatory Visit (INDEPENDENT_AMBULATORY_CARE_PROVIDER_SITE_OTHER): Payer: Self-pay | Admitting: Family Medicine

## 2020-11-29 ENCOUNTER — Other Ambulatory Visit: Payer: Self-pay

## 2020-11-29 ENCOUNTER — Encounter: Payer: Self-pay | Admitting: Family Medicine

## 2020-11-29 VITALS — BP 108/73 | HR 85 | Temp 98.6°F | Ht 64.0 in | Wt 216.0 lb

## 2020-11-29 DIAGNOSIS — H8112 Benign paroxysmal vertigo, left ear: Secondary | ICD-10-CM

## 2020-11-29 DIAGNOSIS — H6982 Other specified disorders of Eustachian tube, left ear: Secondary | ICD-10-CM

## 2020-11-29 DIAGNOSIS — B9689 Other specified bacterial agents as the cause of diseases classified elsewhere: Secondary | ICD-10-CM

## 2020-11-29 DIAGNOSIS — J329 Chronic sinusitis, unspecified: Secondary | ICD-10-CM

## 2020-11-29 MED ORDER — MECLIZINE HCL 25 MG PO TABS: 12.5000 mg | ORAL_TABLET | Freq: Three times a day (TID) | ORAL | 0 refills | Status: DC | PRN

## 2020-11-29 MED ORDER — AMOXICILLIN-POT CLAVULANATE 875-125 MG PO TABS
1.0000 | ORAL_TABLET | Freq: Two times a day (BID) | ORAL | 0 refills | Status: DC
Start: 2020-11-29 — End: 2021-06-26

## 2020-11-29 MED ORDER — FLUTICASONE PROPIONATE 50 MCG/ACT NA SUSP
2.0000 | Freq: Every day | NASAL | 6 refills | Status: DC
Start: 2020-11-29 — End: 2021-06-26

## 2020-11-29 NOTE — Patient Instructions (Addendum)
Start augmentin every 12 hours with food for 10 days.  Start flonase nasal spray every 12 hours (2 sprays) Start OTC allergy med of choice.  Meclizine- 1/2 tab -1 tab every 8 hours as needed (may cause sleepiness)  How to Perform the Epley Maneuver The Epley maneuver is an exercise that relieves symptoms of vertigo. Vertigo is the feeling that you or your surroundings are moving when they are not. When you feel vertigo, you may feel like the room is spinning and have trouble walking. Dizziness is a little different than vertigo. When you are dizzy, you may feel unsteady or light-headed. You can do this maneuver at home whenever you have symptoms of vertigo. You can do it up to 3 times a day until your symptoms go away. Even though the Epley maneuver may relieve your vertigo for a few weeks, it is possible that your symptoms will return. This maneuver relieves vertigo, but it does not relieve dizziness. What are the risks? If it is done correctly, the Epley maneuver is considered safe. Sometimes it can lead to dizziness or nausea that goes away after a short time. If you develop other symptoms, such as changes in vision, weakness, or numbness, stop doing the maneuver and call your health care provider. How to perform the Epley maneuver 1. Sit on the edge of a bed or table with your back straight and your legs extended or hanging over the edge of the bed or table. 2. Turn your head halfway toward the affected ear or side. 3. Lie backward quickly with your head turned until you are lying flat on your back. You may want to position a pillow under your shoulders. 4. Hold this position for 30 seconds. You may experience an attack of vertigo. This is normal. 5. Turn your head to the opposite direction until your unaffected ear is facing the floor. 6. Hold this position for 30 seconds. You may experience an attack of vertigo. This is normal. Hold this position until the vertigo stops. 7. Turn your whole  body to the same side as your head. Hold for another 30 seconds. 8. Sit back up. You can repeat this exercise up to 3 times a day. Follow these instructions at home:  After doing the Epley maneuver, you can return to your normal activities.  Ask your health care provider if there is anything you should do at home to prevent vertigo. He or she may recommend that you: ? Keep your head raised (elevated) with two or more pillows while you sleep. ? Do not sleep on the side of your affected ear. ? Get up slowly from bed. ? Avoid sudden movements during the day. ? Avoid extreme head movement, like looking up or bending over. Contact a health care provider if:  Your vertigo gets worse.  You have other symptoms, including: ? Nausea. ? Vomiting. ? Headache. Get help right away if:  You have vision changes.  You have a severe or worsening headache or neck pain.  You cannot stop vomiting.  You have new numbness or weakness in any part of your body. Summary  Vertigo is the feeling that you or your surroundings are moving when they are not.  The Epley maneuver is an exercise that relieves symptoms of vertigo.  If the Epley maneuver is done correctly, it is considered safe. You can do it up to 3 times a day. This information is not intended to replace advice given to you by your health care provider.  Make sure you discuss any questions you have with your health care provider. Document Revised: 11/21/2017 Document Reviewed: 10/29/2016 Elsevier Patient Education  2020 [employer]   Benign Positional Vertigo Vertigo is the feeling that you or your surroundings are moving when they are not. Benign positional vertigo is the most common form of vertigo. This is usually a harmless condition (benign). This condition is positional. This means that symptoms are triggered by certain movements and positions. This condition can be dangerous if it occurs while you are doing something that could  cause harm to you or others. This includes activities such as driving or operating machinery. What are the causes? In many cases, the cause of this condition is not known. It may be caused by a disturbance in an area of the inner ear that helps your brain to sense movement and balance. This disturbance can be caused by:  Viral infection (labyrinthitis).  Head injury.  Repetitive motion, such as jumping, dancing, or running. What increases the risk? You are more likely to develop this condition if:  You are a woman.  You are 24 years of age or older. What are the signs or symptoms? Symptoms of this condition usually happen when you move your head or your eyes in different directions. Symptoms may start suddenly, and usually last for less than a minute. They include:  Loss of balance and falling.  Feeling like you are spinning or moving.  Feeling like your surroundings are spinning or moving.  Nausea and vomiting.  Blurred vision.  Dizziness.  Involuntary eye movement (nystagmus). Symptoms can be mild and cause only minor problems, or they can be severe and interfere with daily life. Episodes of benign positional vertigo may return (recur) over time. Symptoms may improve over time. How is this diagnosed? This condition may be diagnosed based on:  Your medical history.  Physical exam of the head, neck, and ears.  Tests, such as: ? MRI. ? CT scan. ? Eye movement tests. Your health care provider may ask you to change positions quickly while he or she watches you for symptoms of benign positional vertigo, such as nystagmus. Eye movement may be tested with a variety of exams that are designed to evaluate or stimulate vertigo. ? An electroencephalogram (EEG). This records electrical activity in your brain. ? Hearing tests. You may be referred to a health care provider who specializes in ear, nose, and throat (ENT) problems (otolaryngologist) or a provider who specializes in  disorders of the nervous system (neurologist). How is this treated?  This condition may be treated in a session in which your health care provider moves your head in specific positions to adjust your inner ear back to normal. Treatment for this condition may take several sessions. Surgery may be needed in severe cases, but this is rare. In some cases, benign positional vertigo may resolve on its own in 2-4 weeks. Follow these instructions at home: Safety  Move slowly. Avoid sudden body or head movements or certain positions, as told by your health care provider.  Avoid driving until your health care provider says it is safe for you to do so.  Avoid operating heavy machinery until your health care provider says it is safe for you to do so.  Avoid doing any tasks that would be dangerous to you or others if vertigo occurs.  If you have trouble walking or keeping your balance, try using a cane for stability. If you feel dizzy or unstable, sit down right away.  Return to your normal activities as told by your health care provider. Ask your health care provider what activities are safe for you. General instructions  Take over-the-counter and prescription medicines only as told by your health care provider.  Drink enough fluid to keep your urine pale yellow.  Keep all follow-up visits as told by your health care provider. This is important. Contact a health care provider if:  You have a fever.  Your condition gets worse or you develop new symptoms.  Your family or friends notice any behavioral changes.  You have nausea or vomiting that gets worse.  You have numbness or a "pins and needles" sensation. Get help right away if you:  Have difficulty speaking or moving.  Are always dizzy.  Faint.  Develop severe headaches.  Have weakness in your legs or arms.  Have changes in your hearing or vision.  Develop a stiff neck.  Develop sensitivity to light. Summary  Vertigo is  the feeling that you or your surroundings are moving when they are not. Benign positional vertigo is the most common form of vertigo.  The cause of this condition is not known. It may be caused by a disturbance in an area of the inner ear that helps your brain to sense movement and balance.  Symptoms include loss of balance and falling, feeling that you or your surroundings are moving, nausea and vomiting, and blurred vision.  This condition can be diagnosed based on symptoms, physical exam, and other tests, such as MRI, CT scan, eye movement tests, and hearing tests.  Follow safety instructions as told by your health care provider. You will also be told when to contact your health care provider in case of problems. This information is not intended to replace advice given to you by your health care provider. Make sure you discuss any questions you have with your health care provider. Document Revised: 05/20/2018 Document Reviewed: 05/20/2018 Elsevier Patient Education  2020 [employer] Inc.   Eustachian Tube Dysfunction  Eustachian tube dysfunction refers to a condition in which a blockage develops in the narrow passage that connects the middle ear to the back of the nose (eustachian tube). The eustachian tube regulates air pressure in the middle ear by letting air move between the ear and nose. It also helps to drain fluid from the middle ear space. Eustachian tube dysfunction can affect one or both ears. When the eustachian tube does not function properly, air pressure, fluid, or both can build up in the middle ear. What are the causes? This condition occurs when the eustachian tube becomes blocked or cannot open normally. Common causes of this condition include:  Ear infections.  Colds and other infections that affect the nose, mouth, and throat (upper respiratory tract).  Allergies.  Irritation from cigarette smoke.  Irritation from stomach acid coming up into the esophagus  (gastroesophageal reflux). The esophagus is the tube that carries food from the mouth to the stomach.  Sudden changes in air pressure, such as from descending in an airplane or scuba diving.  Abnormal growths in the nose or throat, such as: ? Growths that line the nose (nasal polyps). ? Abnormal growth of cells (tumors). ? Enlarged tissue at the back of the throat (adenoids). What increases the risk? You are more likely to develop this condition if:  You smoke.  You are overweight.  You are a child who has: ? Certain birth defects of the mouth, such as cleft palate. ? Large tonsils or adenoids. What  are the signs or symptoms? Common symptoms of this condition include:  A feeling of fullness in the ear.  Ear pain.  Clicking or popping noises in the ear.  Ringing in the ear.  Hearing loss.  Loss of balance.  Dizziness. Symptoms may get worse when the air pressure around you changes, such as when you travel to an area of high elevation, fly on an airplane, or go scuba diving. How is this diagnosed? This condition may be diagnosed based on:  Your symptoms.  A physical exam of your ears, nose, and throat.  Tests, such as those that measure: ? The movement of your eardrum (tympanogram). ? Your hearing (audiometry). How is this treated? Treatment depends on the cause and severity of your condition.  In mild cases, you may relieve your symptoms by moving air into your ears. This is called "popping the ears."  In more severe cases, or if you have symptoms of fluid in your ears, treatment may include: ? Medicines to relieve congestion (decongestants). ? Medicines that treat allergies (antihistamines). ? Nasal sprays or ear drops that contain medicines that reduce swelling (steroids). ? A procedure to drain the fluid in your eardrum (myringotomy). In this procedure, a small tube is placed in the eardrum to:  Drain the fluid.  Restore the air in the middle ear  space. ? A procedure to insert a balloon device through the nose to inflate the opening of the eustachian tube (balloon dilation). Follow these instructions at home: Lifestyle  Do not do any of the following until your health care provider approves: ? Travel to high altitudes. ? Fly in airplanes. ? Work in a Estate agent or room. ? Scuba dive.  Do not use any products that contain nicotine or tobacco, such as cigarettes and e-cigarettes. If you need help quitting, ask your health care provider.  Keep your ears dry. Wear fitted earplugs during showering and bathing. Dry your ears completely after. General instructions  Take over-the-counter and prescription medicines only as told by your health care provider.  Use techniques to help pop your ears as recommended by your health care provider. These may include: ? Chewing gum. ? Yawning. ? Frequent, forceful swallowing. ? Closing your mouth, holding your nose closed, and gently blowing as if you are trying to blow air out of your nose.  Keep all follow-up visits as told by your health care provider. This is important. Contact a health care provider if:  Your symptoms do not go away after treatment.  Your symptoms come back after treatment.  You are unable to pop your ears.  You have: ? A fever. ? Pain in your ear. ? Pain in your head or neck. ? Fluid draining from your ear.  Your hearing suddenly changes.  You become very dizzy.  You lose your balance. Summary  Eustachian tube dysfunction refers to a condition in which a blockage develops in the eustachian tube.  It can be caused by ear infections, allergies, inhaled irritants, or abnormal growths in the nose or throat.  Symptoms include ear pain, hearing loss, or ringing in the ears.  Mild cases are treated with maneuvers to unblock the ears, such as yawning or ear popping.  Severe cases are treated with medicines. Surgery may also be done (rare). This  information is not intended to replace advice given to you by your health care provider. Make sure you discuss any questions you have with your health care provider. Document Revised: 03/31/2018 Document Reviewed: 03/31/2018  Elsevier Patient Education  [employer].

## 2020-11-29 NOTE — Progress Notes (Signed)
This visit occurred during the SARS-CoV-2 public health emergency.  Safety protocols were in place, including screening questions prior to the visit, additional usage of staff PPE, and extensive cleaning of exam room while observing appropriate contact time as indicated for disinfecting solutions.    Robin Suarez , March 23, 1996, 24 y.o., female MRN: [id number] Patient Care Team    Relationship Specialty Notifications Start End  Natalia Leatherwood, DO PCP - General Family Medicine  07/31/20     Chief Complaint  Patient presents with  . Dizziness    pt c/o dizziness x 2 week; pt states sx has gradually worsening ; pt states she is also experiencing disturbance with certain movements; "things are wavey"     Subjective: Pt presents for an OV with complaints of dizziness of 2 weeks duration.  Associated symptoms include movement makes symptoms worse- turning head left. She feels it has progressively worsened over the last 2 days. Significant medical h/o contraceptive use, obesity and migraine. She reports she has never experienced vertigo/movement like this prior. She was sitting at her desk at work, turned her head and she felt like she was falling off her chair. She has had a very mild headache for 2 days- points to her frontal sinus.  She denies fever, chills, emesis or bowel changes. She denies recent URI symptoms. She has had no recent med changes. She did have one near syncope episode last week after showering. She reports that dizziness was different and she felt like she was going to pass out. She state down and feeling subsided.  She does have nausea when turning head to left.   Depression screen [facility] 2/9 11/29/2020  Decreased Interest 0  Down, Depressed, Hopeless 0  PHQ - 2 Score 0  Altered sleeping 0  Tired, decreased energy 1  Change in appetite 0  Feeling bad or failure about yourself  0  Trouble concentrating 0  Moving slowly or fidgety/restless 0  Suicidal thoughts 0   PHQ-9 Score 1    Allergies  Allergen Reactions  . Cefprozil Other (See Comments)    RASH HEAD TO TOE  . Nickel     Rash with contact  . Sulfa Antibiotics     NAUSEA, GI UPSET   Social History   Social History Narrative   Homeschooled, junior HS level.   No sibs.  Lives with mom and dad near [place].   Dad smokes.  Dogs in home.  Well water.         Past Medical History:  Diagnosis Date  . Anxiety and depression 11/11/2011  . Chicken pox as a child  . Contact dermatitis 06/04/2012   TO NICKLE  . Gallstones   . GERD (gastroesophageal reflux disease)   . 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 Migraine 04/16/2016  . Nickel allergy   . Obesity 11/11/2011   PT HAS LOST AT LEAST 100 LBS OVER PAST 9 TO 10 MONTHS-NO LONGER OBESE  . Overactive bladder    NO LONGER A PROBLEM  . PONV (postoperative nausea and vomiting)    NAUSEA AFTER ONE SURGERY AS A CHILD  . Social anxiety disorder 11/11/2011  . Vesico-ureteral reflux    BLADDER REFLUX AS A CHILD - NO PROBLEM NOW   Past Surgical History:  Procedure Laterality Date  . broken arm     left, repair of ulna and radius  . CHOLECYSTECTOMY N/A 08/09/2014   Procedure: LAPAROSCOPIC CHOLECYSTECTOMY WITH INTRAOPERATIVE CHOLANGIOGRAM;  Surgeon: Atilano Ina, MD;  Location: [facility];  Service: General;  Laterality: N/A;  . tubes in ears  24 yr old  . WISDOM TOOTH EXTRACTION  NOV 2013   Family History  Problem Relation Age of Onset  . Diabetes Father        type 2  . Other Father        muscular spasm of the heart  . Hyperlipidemia Father   . Hyperlipidemia Maternal Grandmother   . Hypertension Maternal Grandmother   . Diabetes Maternal Grandmother        type 2  . Other Paternal Grandmother        arrythmia  . Diabetes Paternal Grandmother        type 2  . Pancreatic cancer Paternal Grandmother 43  . Diabetes Paternal Grandfather        type 2  . Hyperlipidemia Paternal Grandfather   . Other Paternal Grandfather        fluid  around the heart  . Cancer Maternal Grandfather        laryngeal, alcohol and tobacco   Allergies as of 11/29/2020      Reactions   Cefprozil Other (See Comments)   RASH HEAD TO TOE   Nickel    Rash with contact   Sulfa Antibiotics    NAUSEA, GI UPSET      Medication List       Accurate as of November 29, 2020  1:15 PM. If you have any questions, ask your nurse or doctor.        STOP taking these medications   nitrofurantoin (macrocrystal-monohydrate) 100 MG capsule Commonly known as: Macrobid Stopped by: Felix Pacini, DO     TAKE these medications   norethindrone-ethinyl estradiol 1-20 MG-MCG tablet Commonly known as: Junel FE 1/20 Take 1 tablet by mouth daily.   phentermine 37.5 MG tablet Commonly known as: ADIPEX-P Half a tab QOD       All past medical history, surgical history, allergies, family history, immunizations andmedications were updated in the EMR today and reviewed under the history and medication portions of their EMR.     ROS: Negative, with the exception of above mentioned in HPI   Objective:  BP 108/73   Pulse 85   Temp 98.6 F (37 C) (Oral)   Ht 5\' 4"  (1.626 m)   Wt 216 lb (98 kg)   SpO2 100%   BMI 37.08 kg/m  Body mass index is 37.08 kg/m. Gen: Afebrile. No acute distress. Nontoxic in appearance, well developed, well nourished.  HENT: AT. [place]. Bilateral TM visualized left TM with clear effusion. MMM, no oral lesions. Bilateral nares with erythema and drainage, no swelling noted. Throat without erythema or exudates. No cough or hoarseness.  Eyes:Pupils Equal Round Reactive to light, Extraocular movements intact,  Conjunctiva without redness, discharge or icterus. Neck/lymp/endocrine: Supple,no lymphadenopathy CV: RRR  Chest: CTAB, no wheeze or crackles. Skin: no rashes, purpura or petechiae.  Neuro:  Normal gait. PERLA. EOMi. Alert. Oriented x3  Dix hallpike: sx reproduce with left head tilt- no nystagmus appreciated  No exam data  present No results found. No results found for this or any previous visit (from the past 24 hour(s)).  Assessment/Plan: Robin Suarez is a 24 y.o. female present for OV for  Bacterial sinusitis/Dysfunction of left eustachian tube Rest, hydrate.  +/- flonase, nettie pot or nasal saline.  augmentin (tolerated in the past)  prescribed, take until completed.  Start augmentin every 12 hours with food for 10 days.  Start flonase nasal spray every 12 hours (2 sprays) Start OTC allergy med of choice.  Meclizine- 1/2 tab -1 tab every 8 hours as needed (may cause sleepiness) F/U 2 weeks of not improved  Benign paroxysmal positional vertigo of left ear Likely from recent illness.  antivert prescribed epley maneuver explained.  tx of sinus infection and start of floanse.  F/u 2-4 weeks if not improving.       Reviewed expectations re: course of current medical issues.  Discussed self-management of symptoms.  Outlined signs and symptoms indicating need for more acute intervention.  Patient verbalized understanding and all questions were answered.  Patient received an After-Visit Summary.    No orders of the defined types were placed in this encounter.  No orders of the defined types were placed in this encounter.  Referral Orders  No referral(s) requested today     Note is dictated utilizing voice recognition software. Although note has been proof read prior to signing, occasional typographical errors still can be missed. If any questions arise, please do not hesitate to call for verification.   electronically signed by:  Felix Pacini, DO  [facility] - [place]

## 2020-12-01 ENCOUNTER — Ambulatory Visit: Payer: Self-pay | Admitting: Family Medicine

## 2021-06-26 ENCOUNTER — Encounter: Payer: Self-pay | Admitting: Family Medicine

## 2021-06-26 ENCOUNTER — Ambulatory Visit (INDEPENDENT_AMBULATORY_CARE_PROVIDER_SITE_OTHER): Payer: Self-pay | Admitting: Family Medicine

## 2021-06-26 ENCOUNTER — Other Ambulatory Visit: Payer: Self-pay

## 2021-06-26 ENCOUNTER — Ambulatory Visit (HOSPITAL_BASED_OUTPATIENT_CLINIC_OR_DEPARTMENT_OTHER)
Admission: RE | Admit: 2021-06-26 | Discharge: 2021-06-26 | Disposition: A | Discharge status: Home / Self Care | Payer: Self-pay | Source: Ambulatory Visit | Attending: Family Medicine | Admitting: Family Medicine

## 2021-06-26 VITALS — BP 125/81 | HR 92 | Temp 98.5°F | Wt 203.0 lb

## 2021-06-26 DIAGNOSIS — E611 Iron deficiency: Secondary | ICD-10-CM

## 2021-06-26 DIAGNOSIS — R21 Rash and other nonspecific skin eruption: Secondary | ICD-10-CM | POA: Insufficient documentation

## 2021-06-26 DIAGNOSIS — R0602 Shortness of breath: Secondary | ICD-10-CM | POA: Insufficient documentation

## 2021-06-26 DIAGNOSIS — R42 Dizziness and giddiness: Secondary | ICD-10-CM | POA: Insufficient documentation

## 2021-06-26 LAB — CBC WITH DIFFERENTIAL/PLATELET
Basophils Absolute: 0 10*3/uL (ref 0.0–0.1)
Basophils Relative: 0.4 % (ref 0.0–3.0)
Eosinophils Absolute: 0.1 10*3/uL (ref 0.0–0.7)
Eosinophils Relative: 1.9 % (ref 0.0–5.0)
HCT: 40.8 % (ref 36.0–46.0)
Hemoglobin: 14 g/dL (ref 12.0–15.0)
Lymphocytes Relative: 36.1 % (ref 12.0–46.0)
Lymphs Abs: 1.9 10*3/uL (ref 0.7–4.0)
MCHC: 34.5 g/dL (ref 30.0–36.0)
MCV: 91 fl (ref 78.0–100.0)
Monocytes Absolute: 0.4 10*3/uL (ref 0.1–1.0)
Monocytes Relative: 8.2 % (ref 3.0–12.0)
Neutro Abs: 2.8 10*3/uL (ref 1.4–7.7)
Neutrophils Relative %: 53.4 % (ref 43.0–77.0)
Platelets: 248 10*3/uL (ref 150.0–400.0)
RBC: 4.48 Mil/uL (ref 3.87–5.11)
RDW: 12.5 % (ref 11.5–15.5)
WBC: 5.2 10*3/uL (ref 4.0–10.5)

## 2021-06-26 LAB — COMPREHENSIVE METABOLIC PANEL
ALT: 21 U/L (ref 0–35)
AST: 19 U/L (ref 0–37)
Albumin: 4.2 g/dL (ref 3.5–5.2)
Alkaline Phosphatase: 77 U/L (ref 39–117)
BUN: 11 mg/dL (ref 6–23)
CO2: 24 mEq/L (ref 19–32)
Calcium: 9.4 mg/dL (ref 8.4–10.5)
Chloride: 107 mEq/L (ref 96–112)
Creatinine, Ser: 0.93 mg/dL (ref 0.40–1.20)
GFR: 84.71 mL/min (ref >60.00)
Glucose, Bld: 77 mg/dL (ref 70–99)
Potassium: 4.5 mEq/L (ref 3.5–5.1)
Sodium: 138 mEq/L (ref 135–145)
Total Bilirubin: 0.8 mg/dL (ref 0.2–1.2)
Total Protein: 6.5 g/dL (ref 6.0–8.3)

## 2021-06-26 LAB — VITAMIN B12: Vitamin B-12: 159 pg/mL — ABNORMAL LOW (ref 211–911)

## 2021-06-26 MED ORDER — FLUOCINOLONE ACETONIDE 0.01 % EX CREA: TOPICAL_CREAM | Freq: Two times a day (BID) | CUTANEOUS | 0 refills | Status: DC

## 2021-06-26 NOTE — Progress Notes (Signed)
This visit occurred during the SARS-CoV-2 public health emergency.  Safety protocols were in place, including screening questions prior to the visit, additional usage of staff PPE, and extensive cleaning of exam room while observing appropriate contact time as indicated for disinfecting solutions.    Robin Suarez , 1996/02/10, 25 y.o., female MRN: [id number] Patient Care Team    Relationship Specialty Notifications Start End  Mosie Lukes, MD PCP - General Family Medicine  11/29/20     Chief Complaint  Patient presents with   Shortness of Breath    Pt c/o SOB, chest tightness, upper back pain, HA, light headed, tinnitus on exertion x 3.5 weeks; recently worsen;      Subjective: Pt presents for an OV with complaints of feeling winded, mildly short of breath with activity.  She has mild chest tightness, upper back discomfort, intermittent headache ringing in her ears.  She reports symptoms have been worse over the last 3-1/2 to 4 weeks. She reports she will get lightheaded, get a headache, then feel dizzy and winded.  She reports she will feel like she gets tunnel vision and she will start to have ringing in her ears but she does not pass out.  She also endorses having numbness in her lip x1 for about 5 minutes during 1 of these occasions.  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  She reports she tries to stay hydrated and drinks about 64 ounces of water daily.  She does not have a history of asthma or reactive airway disease.  She is not a smoker.  She did have COVID twice, the last being this past January 2022.  She does not routinely take an allergy medication.  She is on birth control pills.  Depression screen [facility] 2/9 11/29/2020  Decreased Interest 0  Down, Depressed, Hopeless 0  PHQ - 2 Score 0  Altered sleeping 0  Tired, decreased energy 1  Change in appetite 0  Feeling bad or failure about yourself  0  Trouble concentrating 0  Moving slowly or  fidgety/restless 0  Suicidal thoughts 0  PHQ-9 Score 1    Allergies  Allergen Reactions   Cefprozil Other (See Comments)    RASH HEAD TO TOE   Nickel     Rash with contact   Sulfa Antibiotics     NAUSEA, GI UPSET   Social History   Social History Narrative   Homeschooled, junior HS level.   No sibs.  Lives with mom and dad near [place].   Dad smokes.  Dogs in home.  Well water.         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   Migraine 04/16/2016   Nickel allergy    Obesity 11/11/2011   PT HAS LOST AT LEAST 100 LBS OVER PAST 9 TO 10 MONTHS-NO LONGER OBESE   Overactive bladder    NO LONGER A PROBLEM   PONV (postoperative nausea and vomiting)    NAUSEA AFTER ONE SURGERY AS A CHILD   Social anxiety disorder 11/11/2011   Vesico-ureteral reflux    BLADDER REFLUX AS A CHILD - NO PROBLEM NOW   Past Surgical History:  Procedure Laterality Date   broken arm  left, repair of ulna and radius   CHOLECYSTECTOMY N/A 08/09/2014   Procedure: LAPAROSCOPIC CHOLECYSTECTOMY WITH INTRAOPERATIVE CHOLANGIOGRAM;  Surgeon: Gayland Curry, MD;  Location: [facility];  Service: General;  Laterality: N/A;   tubes in ears  25 yr old   [place]  NOV 2013   Family History  Problem Relation Age of Onset   Diabetes Father        type 2   Other Father        muscular spasm of the heart   Hyperlipidemia Father    Hyperlipidemia Maternal Grandmother    Hypertension Maternal Grandmother    Diabetes Maternal Grandmother        type 2   Other Paternal Grandmother        arrythmia   Diabetes Paternal Grandmother        type 2   Pancreatic cancer Paternal Grandmother 76   Diabetes Paternal Grandfather        type 2   Hyperlipidemia Paternal Grandfather    Other Paternal Grandfather         fluid around the heart   Cancer Maternal Grandfather        laryngeal, alcohol and tobacco   Allergies as of 06/26/2021       Reactions   Cefprozil Other (See Comments)   RASH HEAD TO TOE   Nickel    Rash with contact   Sulfa Antibiotics    NAUSEA, GI UPSET        Medication List        Accurate as of June 26, 2021 12:34 PM. If you have any questions, ask your nurse or doctor.          STOP taking these medications    amoxicillin-clavulanate 875-125 MG tablet Commonly known as: AUGMENTIN Stopped by: Howard Pouch, DO   fluticasone 50 MCG/ACT nasal spray Commonly known as: FLONASE Stopped by: Howard Pouch, DO   meclizine 25 MG tablet Commonly known as: ANTIVERT Stopped by: Howard Pouch, DO   norethindrone-ethinyl estradiol-FE 1-20 MG-MCG tablet Commonly known as: Junel FE 1/20 Stopped by: Howard Pouch, DO       TAKE these medications    fluocinolone 0.01 % cream Commonly known as: VANOS Apply topically 2 (two) times daily. Started by: Howard Pouch, DO   norethindrone-ethinyl estradiol 1-20 MG-MCG tablet Commonly known as: LOESTRIN Take 1 tablet by mouth daily.   phentermine 37.5 MG tablet Commonly known as: ADIPEX-P Half a tab QOD        All past medical history, surgical history, allergies, family history, immunizations andmedications were updated in the EMR today and reviewed under the history and medication portions of their EMR.     ROS: Negative, with the exception of above mentioned in HPI   Objective:  BP 125/81   Pulse 92   Temp 98.5 F (36.9 C) (Oral)   Wt 203 lb (92.1 kg)   SpO2 100%   BMI 34.84 kg/m  Body mass index is 34.84 kg/m. Gen: Afebrile. No acute distress. Nontoxic in appearance, well developed, well nourished.  Very pleasant female. HENT: AT. [place]. Bilateral TM visualized no erythema, cloudy, no effusion. MMM, no oral lesions. Bilateral nares without erythema, swelling or drainage. Throat without erythema or exudates.   No cough.  No hoarseness. Eyes:Pupils Equal Round Reactive to light, Extraocular movements intact,  Conjunctiva without redness, discharge or icterus. Neck/lymp/endocrine: Supple, no lymphadenopathy CV: RRR no murmur, no edema Chest: CTAB, no wheeze or crackles. Good  air movement, normal resp effort.  Skin: Mildly raised red fine rash in a rectangle pattern left thoracic, no purpura or petechiae.  Neuro: Normal gait. PERLA. EOMi. Alert. Oriented x3  Psych: Normal affect, dress and demeanor. Normal speech. Normal thought content and judgment.  No results found. No results found. No results found for this or any previous visit (from the past 24 hour(s)).  Assessment/Plan: Robin Suarez is a 25 y.o. female present for OV for  Shortness of breath Lung exam was normal today.  Vital signs stable and oxygen saturations 100%.  Possibly related to iron deficiency, she has a history of this.  Will obtain chest x-ray to be complete.  With her COVID history, may need to consider new onset asthma. - Comp Met (CMET) - CBC w/Diff - Iron, TIBC and Ferritin Panel - DG Chest 2 View; Future Will await chest x-ray and lab results.  Iron deficiency/dizzy/positive orthostatic Vital signs stable.  Oxygen saturations 100%.  She has positive orthostatic on exam. Encouraged to increase her water intake as well as electrolyte replacement daily.  Discussed signs of dehydration. She has a history of iron deficiency and does not eat much meat in her diet.  Menstrual cycles are normal. - Comp Met (CMET) - CBC w/Diff - Iron, TIBC and Ferritin Panel - B12 Patient will be called with laboratory results and further guidance dependent upon those lab results.  Rash: Rash on her left thoracic in a rectangle pattern.  Appears consistent with a dermatitis.  She does have allergy to nickel. Vanos cream prescribed  Reviewed expectations re: course of current medical issues. Discussed self-management of  symptoms. Outlined signs and symptoms indicating need for more acute intervention. Patient verbalized understanding and all questions were answered. Patient received an After-Visit Summary.    Orders Placed This Encounter  Procedures   DG Chest 2 View   Comp Met (CMET)   CBC w/Diff   Iron, TIBC and Ferritin Panel   B12    Meds ordered this encounter  Medications   fluocinolone (VANOS) 0.01 % cream    Sig: Apply topically 2 (two) times daily.    Dispense:  30 g    Refill:  0    Referral Orders  No referral(s) requested today     Note is dictated utilizing voice recognition software. Although note has been proof read prior to signing, occasional typographical errors still can be missed. If any questions arise, please do not hesitate to call for verification.   electronically signed by:  Howard Pouch, DO  [place]

## 2021-06-26 NOTE — Patient Instructions (Signed)
Hydrate> water and electrolyte replacement.  Dehydration, Adult Dehydration is condition in which there is not enough water or other fluids in the body. This happens when a person loses more fluids than he or she takes in. Important body parts cannot work right without the right amount of fluids. Anyloss of fluids from the body can cause dehydration. Dehydration can be mild, worse, or very bad. It should be treated right away tokeep it from getting very bad. What are the causes? This condition may be caused by: Conditions that cause loss of water or other fluids, such as: Watery poop (diarrhea). Vomiting. Sweating a lot. Peeing (urinating) a lot. Not drinking enough fluids, especially when you: Are ill. Are doing things that take a lot of energy to do. Other illnesses and conditions, such as fever or infection. Certain medicines, such as medicines that take extra fluid out of the body (diuretics). Lack of safe drinking water. Not being able to get enough water and food. What increases the risk? The following factors may make you more likely to develop this condition: Having a long-term (chronic) illness that has not been treated the right way, such as: Diabetes. Heart disease. Kidney disease. Being 27 years of age or older. Having a disability. Living in a place that is high above the ground or sea (high in altitude). The thinner, dried air causes more fluid loss. Doing exercises that put stress on your body for a long time. What are the signs or symptoms? Symptoms of dehydration depend on how bad it is. Mild or worse dehydration Thirst. Dry lips or dry mouth. Feeling dizzy or light-headed, especially when you stand up from sitting. Muscle cramps. Your body making: Dark pee (urine). Pee may be the color of tea. Less pee than normal. Less tears than normal. Headache. Very bad dehydration Changes in skin. Skin may: Be cold to the touch (clammy). Be blotchy or pale. Not go  back to normal right after you lightly pinch it and let it go. Little or no tears, pee, or sweat. Changes in vital signs, such as: Fast breathing. Low blood pressure. Weak pulse. Pulse that is more than 100 beats a minute when you are sitting still. Other changes, such as: Feeling very thirsty. Eyes that look hollow (sunken). Cold hands and feet. Being mixed up (confused). Being very tired (lethargic) or having trouble waking from sleep. Short-term weight loss. Loss of consciousness. How is this treated? Treatment for this condition depends on how bad it is. Treatment should start right away. Do not wait until your condition gets very bad. Very bad dehydration is an emergency. You will need to go to a hospital. Mild or worse dehydration can be treated at home. You may be asked to: Drink more fluids. Drink an oral rehydration solution (ORS). This drink helps get the right amounts of fluids and salts and minerals in the blood (electrolytes). Very bad dehydration can be treated: With fluids through an IV tube. By getting normal levels of salts and minerals in your blood. This is often done by giving salts and minerals through a tube. The tube is passed through your nose and into your stomach. By treating the root cause. Follow these instructions at home: Oral rehydration solution If told by your doctor, drink an ORS: Make an ORS. Use instructions on the package. Start by drinking small amounts, about  cup (120 mL) every 5-10 minutes. Slowly drink more until you have had the amount that your doctor said to have. Eating and  drinking        Drink enough clear fluid to keep your pee pale yellow. If you were told to drink an ORS, finish the ORS first. Then, start slowly drinking other clear fluids. Drink fluids such as: Water. Do not drink only water. Doing that can make the salt (sodium) level in your body get too low. Water from ice chips you suck on. Fruit juice that you have added  water to (diluted). Low-calorie sports drinks. Eat foods that have the right amounts of salts and minerals, such as: Bananas. Oranges. Potatoes. Tomatoes. Spinach. Do not drink alcohol. Avoid: Drinks that have a lot of sugar. These include: High-calorie sports drinks. Fruit juice that you did not add water to. Soda. Caffeine. Foods that are greasy or have a lot of fat or sugar. General instructions Take over-the-counter and prescription medicines only as told by your doctor. Do not take salt tablets. Doing that can make the salt level in your body get too high. Return to your normal activities as told by your doctor. Ask your doctor what activities are safe for you. Keep all follow-up visits as told by your doctor. This is important. Contact a doctor if: You have pain in your belly (abdomen) and the pain: Gets worse. Stays in one place. You have a rash. You have a stiff neck. You get angry or annoyed (irritable) more easily than normal. You are more tired or have a harder time waking than normal. You feel: Weak or dizzy. Very thirsty. Get help right away if you have: Any symptoms of very bad dehydration. Symptoms of vomiting, such as: You cannot eat or drink without vomiting. Your vomiting gets worse or does not go away. Your vomit has blood or green stuff in it. Symptoms that get worse with treatment. A fever. A very bad headache. Problems with peeing or pooping (having a bowel movement), such as: Watery poop that gets worse or does not go away. Blood in your poop (stool). This may cause poop to look black and tarry. Not peeing in 6-8 hours. Peeing only a small amount of very dark pee in 6-8 hours. Trouble breathing. These symptoms may be an emergency. Do not wait to see if the symptoms will go away. Get medical help right away. Call your local emergency services (911 in the [place] Do not drive yourself to the hospital. Summary Dehydration is a condition in which  there is not enough water or other fluids in the body. This happens when a person loses more fluids than he or she takes in. Treatment for this condition depends on how bad it is. Treatment should be started right away. Do not wait until your condition gets very bad. Drink enough clear fluid to keep your pee pale yellow. If you were told to drink an oral rehydration solution (ORS), finish the ORS first. Then, start slowly drinking other clear fluids. Take over-the-counter and prescription medicines only as told by your doctor. Get help right away if you have any symptoms of very bad dehydration. This information is not intended to replace advice given to you by your health care provider. Make sure you discuss any questions you have with your healthcare provider. Document Revised: 07/22/2019 Document Reviewed: 07/22/2019 Elsevier Patient Education  2022 [employer].

## 2021-06-27 ENCOUNTER — Telehealth: Payer: Self-pay | Admitting: Family Medicine

## 2021-06-27 LAB — IRON,TIBC AND FERRITIN PANEL
%SAT: 28 % (calc) (ref 16–45)
Ferritin: 52 ng/mL (ref 16–154)
Iron: 112 ug/dL (ref 40–190)
TIBC: 403 mcg/dL (calc) (ref 250–450)

## 2021-06-27 MED ORDER — CYANOCOBALAMIN 1000 MCG SL SUBL: SUBLINGUAL_TABLET | SUBLINGUAL | 3 refills | Status: DC

## 2021-06-27 MED ORDER — PREDNISONE 50 MG PO TABS: 50.0000 mg | ORAL_TABLET | Freq: Every day | ORAL | 0 refills | Status: DC

## 2021-06-27 NOTE — Telephone Encounter (Signed)
Left message on patient voicemail to call office to schedule TOC appt with Dr. Claiborne Billings

## 2021-06-27 NOTE — Telephone Encounter (Signed)
Spoke with patient regarding results/recommendations. First b12 injection scheduled, she would like to transfer care to [facility]. Advised someone will contact for scheduling.  Please assist patient with scheduling, thanks.

## 2021-06-27 NOTE — Telephone Encounter (Signed)
Please inform patient the following information: Chest x-ray is normal. Her liver, kidney and electrolyte levels are all normal. Her blood cell counts are normal and there is no signs of anemia or infection. Her iron panel is also normal and looks great. Her B12 vitamin is significantly low at 159.  This could be the cause of some of her symptoms.  This is extremely low levels, B12 deficiency can cause lack of energy, dizziness, headaches and other neurological symptoms.   -Recommendations:       -with a B12 level this low, I do recommend she have weekly B12 injections once every 2 weeks for 4 doses by nurse appointment.  If she is agreeable to this please place order and schedule her for the first injection ASAP.      -the B12 injections help increase her B12 levels into normal range of at least greater than 500, and ideally around 750 or higher.     -I have also called in B12 tabs that you placed under your tongue daily.  If insurance does not pay for these she can purchase over-the-counter, but must be sublingual/under the tongue in order for her to absorb them adequately.  She is to start the B12 sublingual tabs as well as the injections. After B12 injections are completed, she will continue the sublingual's indefinitely.  -I did call in the cream for her rash on her back.  I also called in a short burst of prednisone which is a steroid.  This will be 1 pill for 5 days.  This will likely help with the rash and if she is having some mild inflammation like reactive airway disease in her lungs, this will help resolve that as well. -I would recommend she also take a daily antihistamine before bed such as Zyrtec if she is not already. - Last commendation is for her to increase her water and electrolyte replacement daily to at least 80 ounces and if outside working in the heat and sweating needs to be around 100 ounces.  I would like to follow-up with her in 3-4 weeks on all the above  conditions.  Thanks

## 2021-06-28 ENCOUNTER — Ambulatory Visit (INDEPENDENT_AMBULATORY_CARE_PROVIDER_SITE_OTHER): Payer: Self-pay

## 2021-06-28 ENCOUNTER — Other Ambulatory Visit: Payer: Self-pay

## 2021-06-28 DIAGNOSIS — E538 Deficiency of other specified B group vitamins: Secondary | ICD-10-CM

## 2021-06-28 MED ORDER — CYANOCOBALAMIN 1000 MCG/ML IJ SOLN
1000.0000 ug | Freq: Once | INTRAMUSCULAR | Status: AC
  Administered 2021-06-28: 1000 ug via INTRAMUSCULAR

## 2021-06-28 NOTE — Progress Notes (Signed)
Robin Suarez is a 25 y.o. female presents to the office today for biweekly b12 injections, per physician's orders. Original order: weekly B12 injections once every 2 weeks for 4 doses by nurse appointment. B12 ,  IM (route) was administered right deltoid today. Patient tolerated injection. Patient due for follow up labs/provider appt: Yes. Date due: 07/20/21, appt made Yes Patient next injection due: 2 weeks, appt made No  Matheu Ploeger D Coralyn Roselli

## 2021-06-29 ENCOUNTER — Ambulatory Visit: Payer: Self-pay

## 2021-07-03 NOTE — Telephone Encounter (Signed)
Patient called back and scheduled TOC appt with Dr. Claiborne Billings  7/29

## 2021-07-04 ENCOUNTER — Encounter: Payer: Self-pay | Admitting: Family Medicine

## 2021-07-10 ENCOUNTER — Telehealth: Payer: Self-pay

## 2021-07-10 NOTE — Telephone Encounter (Signed)
Please advise,  See pt message on 07/04/21:  Good afternoon Dr. Claiborne Billings,   I hope you are doing well. After my first B12 injection on 06/28/21 I felt much better and seemed to be improving. However, today it is like everything has changed and I feel extremely fatigued, dizzy, short of breath, and very light headed. I finished the Prednisone on 07/02/21. The shortness of breathe seemed some better during the prednisone but now seems to be going backwards. I am using the B12 sublingual daily. Is there anything I can do to help these symptoms? Thank you for you help.   Best Regards, [employer]

## 2021-07-10 NOTE — Telephone Encounter (Signed)
  Patient has B12 scheduled on 7/22. She said she was still having symptoms.  She was upset that she was trying to get recommendations from Dr. Claiborne Billings about her symptoms, instead she felt like her questions were not going to Dr. Claiborne Billings but only answered by Dr. Alan Ripper clinical assistant.  I advised her to keep her appt on 7/12 for B12, Dr. Claiborne Billings had no earlier appts available before her appt with her.  Patient has a TOC appt scheduled on 7/29.  I let patient know that Dr. Claiborne Billings is out of office and will not respond to messages until her return.  Please call patient [phone].

## 2021-07-11 MED ORDER — ALBUTEROL SULFATE HFA 108 (90 BASE) MCG/ACT IN AERS: 2.0000 | INHALATION_SPRAY | Freq: Four times a day (QID) | RESPIRATORY_TRACT | 0 refills | Status: DC | PRN

## 2021-07-11 MED ORDER — MONTELUKAST SODIUM 10 MG PO TABS: 10.0000 mg | ORAL_TABLET | Freq: Every day | ORAL | 3 refills | Status: DC

## 2021-07-11 NOTE — Telephone Encounter (Signed)
Attempted to contact pt, unable to LVM. Please advise

## 2021-07-11 NOTE — Telephone Encounter (Signed)
Spoke with patient regarding results/recommendations.  

## 2021-07-11 NOTE — Telephone Encounter (Signed)
Please call patient: Since she reports she was seeing some improvement with prednisone, I have called in 2 additional medications I want her to start before her follow-up appointment. I also want to make sure she is taking the Zyrtec before bed which is over-the-counter. I called in Singulair which she is also a medication used for allergies and in people who have asthma.  This is taking 1 tab before bed as well. I called in an albuterol inhaler for her.  This is to be used if feeling short of breath or winded.  She would take 1-2 puffs in those cases.  If her symptoms improve during that time and with the medications above, that would suggest this is more of an asthma onset.  Keep follow-up appointment scheduled.  Hopefully these additional meds will help.

## 2021-07-13 ENCOUNTER — Ambulatory Visit: Payer: Self-pay

## 2021-07-13 ENCOUNTER — Telehealth: Payer: Self-pay | Admitting: Family Medicine

## 2021-07-13 NOTE — Telephone Encounter (Signed)
Patient had B12 shot that needed to be rescheduled today due to extremely short staffing and lab/nurse visit closure. I attempted to call patient at listed number, twice and both times was unable to leave voicemail due to mailbox full. Patient and her mother came in the office for appointment today and I relayed the above information.  Patient's mother became irate and rude with staff. She insolently asked why Dr. Claiborne Billings herself could not do the shot today. She said this presents an issue and she was not happy with our service thus far. I offered to reschedule appointment but patient stated she could only come in on Fridays. Patient already has TOC appt scheduled for next Friday and B12 shot was added to appt notes for that day. Patient's mother again wanted to know why Dr. Claiborne Billings would not do it today if it was just going to be done at her appointment. I explained the process that the nurses administer shots, pull blood, etc. Patient then asked for DPR which the mother filled out, as observed by office staff, unsure if mother or daughter signed on signature line. Then mother handed DPR to office staff and rudely asked how soon it would be entered into the chart. Within minutes of exiting office door, patient's mother called back demand to speak with the nurse. I agreed to send message and inquired what to advise nurse. Mother sharply stated it was about the B12 shot and to just have the nurse call her. Please call patient's mother at [phone] to advise.

## 2021-07-17 NOTE — Telephone Encounter (Signed)
Noted  

## 2021-07-20 ENCOUNTER — Ambulatory Visit (INDEPENDENT_AMBULATORY_CARE_PROVIDER_SITE_OTHER): Payer: Self-pay | Admitting: Family Medicine

## 2021-07-20 ENCOUNTER — Encounter: Payer: Self-pay | Admitting: Family Medicine

## 2021-07-20 ENCOUNTER — Other Ambulatory Visit: Payer: Self-pay

## 2021-07-20 VITALS — BP 119/75 | HR 83 | Temp 98.1°F | Ht 62.5 in | Wt 206.0 lb

## 2021-07-20 DIAGNOSIS — J4520 Mild intermittent asthma, uncomplicated: Secondary | ICD-10-CM

## 2021-07-20 DIAGNOSIS — Z7689 Persons encountering health services in other specified circumstances: Secondary | ICD-10-CM

## 2021-07-20 DIAGNOSIS — Z309 Encounter for contraceptive management, unspecified: Secondary | ICD-10-CM

## 2021-07-20 DIAGNOSIS — J302 Other seasonal allergic rhinitis: Secondary | ICD-10-CM

## 2021-07-20 DIAGNOSIS — E538 Deficiency of other specified B group vitamins: Secondary | ICD-10-CM

## 2021-07-20 DIAGNOSIS — R21 Rash and other nonspecific skin eruption: Secondary | ICD-10-CM

## 2021-07-20 MED ORDER — NORETHINDRONE ACET-ETHINYL EST 1-20 MG-MCG PO TABS
1.0000 | ORAL_TABLET | Freq: Every day | ORAL | 1 refills | Status: DC
Start: 2021-07-20 — End: 2022-01-30

## 2021-07-20 MED ORDER — MONTELUKAST SODIUM 10 MG PO TABS: 10.0000 mg | ORAL_TABLET | Freq: Every day | ORAL | 5 refills | Status: DC

## 2021-07-20 MED ORDER — CYANOCOBALAMIN 1000 MCG/ML IJ SOLN
1000.0000 ug | Freq: Once | INTRAMUSCULAR | Status: AC
  Administered 2021-07-20: 1000 ug via INTRAMUSCULAR

## 2021-07-20 NOTE — Progress Notes (Signed)
Patient ID: [name], female  DOB: 1996/10/06, 25 y.o.   MRN: [id number] Patient Care Team    Relationship Specialty Notifications Start End  Ma Hillock, DO PCP - General Family Medicine  07/20/21     Chief Complaint  Patient presents with   Establish Care    [facility];    IFG    Pt had Cpe labs with OBGYN and had ifg with glucose with 112     Subjective:  Charish E Barren is a 25 y.o.  female present for new patient establishment/TOC appt and follow up  All past medical history, surgical history, allergies, family history, immunizations, medications and social history were updated in the electronic medical record today. All recent labs, ED visits and hospitalizations within the last year were reviewed.  B12 deficiency/fatigue/RAD: Patient was seen by this provider -today she is transferring care well following up on her fatigue.  After last visit patient reports she felt much improved, unknown if it was secondary to B12 shot or steroid burst.  Since that time symptoms have resurfaced but they are improved. She had a normal chest x-ray, CMP, CBC, iron panel.  Her B12 was significantly low at 159.  She has had 1B 12 injection and she is taking sublingual B12 daily.  She is due for her second injection today. She is taking her daily antihistamine and working on staying well-hydrated with water and electrolytes.  Rash: Her rash has completely resolved with the use of steroid cream    Prior note:  Pt presents for an OV with complaints of feeling winded, mildly short of breath with activity.  She has mild chest tightness, upper back discomfort, intermittent headache ringing in her ears.  She reports symptoms have been worse over the last 3-1/2 to 4 weeks. She reports she will get lightheaded, get a headache, then feel dizzy and winded.  She reports she will feel like she gets tunnel vision and she will start to have ringing in her ears but she does not pass out.  She also  endorses having numbness in her lip x1 for about 5 minutes during 1 of these occasions.  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  She reports she tries to stay hydrated and drinks about 64 ounces of water daily.  She does not have a history of asthma or reactive airway disease.  She is not a smoker.  She did have COVID twice, the last being this past January 2022.  She does not routinely take an allergy medication.  She is on birth control pills  Depression screen [facility] 2/9 07/20/2021 11/29/2020  Decreased Interest 0 0  Down, Depressed, Hopeless 0 0  PHQ - 2 Score 0 0  Altered sleeping 0 0  Tired, decreased energy 1 1  Change in appetite 0 0  Feeling bad or failure about yourself  0 0  Trouble concentrating 0 0  Moving slowly or fidgety/restless 0 0  Suicidal thoughts 0 0  PHQ-9 Score 1 1   GAD 7 : Generalized Anxiety Score 07/20/2021  Nervous, Anxious, on Edge 0  Control/stop worrying 0  Worry too much - different things 0  Trouble relaxing 0  Restless 0  Easily annoyed or irritable 0  Afraid - awful might happen 0  Total GAD 7 Score 0        Fall Risk  08/01/2020  Falls in the past year? 0  Number falls in  past yr: 0  Injury with Fall? 0  Follow up Falls evaluation completed   Immunization History  Administered Date(s) Administered   DTaP 12/12/1994, 02/10/1995, 04/17/1995, 01/16/1996, 05/02/2000   Hepatitis B 01[birth year]09/94, 12/12/1994, 04/17/1995   HiB (PRP-OMP) 12/12/1994, 02/10/1995, 04/17/1995, 01/16/1996   IPV 12/12/1994, 02/10/1995, 04/17/1995, 05/02/2000   Influenza Split 09/04/2012   Influenza,inj,Quad PF,6+ Mos 11/27/2018, 08/29/2019, 08/29/2019   Influenza-Unspecified 11/01/2013, 10/06/2020   MMR 01/16/1996, 05/02/2000   Tdap 11/11/2011    No results found.  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 Hives. Throat symptoms.    Nickel     Rash with contact   Sulfa Antibiotics     NAUSEA, GI UPSET   Past Surgical History:  Procedure Laterality Date   broken arm  2002   left, repair of ulna and radius   CHOLECYSTECTOMY N/A 08/09/2014   Procedure: LAPAROSCOPIC CHOLECYSTECTOMY WITH INTRAOPERATIVE CHOLANGIOGRAM;  Surgeon: Gayland Curry, MD;  Location: [facility];  Service: General;  Laterality: N/A;   Tympanostomy with myringotomy Bilateral 1996   WISDOM TOOTH EXTRACTION  10/23/2012   Family History  Problem Relation Age of Onset   Stroke Father    Heart disease Father    Depression Father    Diabetes Father        type 2   Other Father        muscular spasm of the heart   Hyperlipidemia Father    Heart attack Father    Hyperlipidemia Maternal Grandmother    Hypertension Maternal Grandmother    Diabetes Maternal Grandmother        type 2   Skin cancer Maternal Grandmother    Cancer Maternal Grandfather        laryngeal, alcohol and tobacco   Other Paternal Grandmother        arrythmia   Diabetes Paternal Grandmother        type 2   Pancreatic cancer Paternal Grandmother 31   Diabetes Paternal Grandfather        type 2   Hyperlipidemia Paternal Grandfather    Other Paternal Grandfather        fluid around the heart   Rheum arthritis Paternal Grandfather    Alzheimer's disease  Paternal Grandfather    Social History   Social History Narrative   Marital status/children/pets: Single, G0P0   Education/employment: Secretary/administrator, 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 Hives. Throat symptoms.    Nickel    Rash with contact   Sulfa Antibiotics    NAUSEA, GI UPSET        Medication List        Accurate as of July 20, 2021 11:59 PM. If you have any questions, ask your nurse or doctor.          STOP taking these medications    phentermine 37.5 MG tablet Commonly known as: ADIPEX-P Stopped by: Howard Pouch, DO   predniSONE 50 MG tablet Commonly known as: DELTASONE Stopped by: Howard Pouch, DO       TAKE these medications    albuterol 108 (90 Base) MCG/ACT inhaler Commonly known as: VENTOLIN HFA Inhale 2 puffs into the lungs every 6 (six) hours as needed for wheezing or shortness of breath.   Cyanocobalamin 1000 MCG Subl 1 tab placed under the tongue daily.   fluocinolone 0.01 % cream Commonly known as: VANOS Apply topically 2 (two) times daily.   montelukast 10 MG tablet Commonly known as: SINGULAIR Take 1 tablet (10 mg total) by mouth at bedtime.   norethindrone-ethinyl estradiol 1-20 MG-MCG tablet Commonly known as: LOESTRIN Take 1 tablet by mouth daily.        All past medical history, surgical history, allergies, family history, immunizations andmedications were updated in the EMR today and reviewed under the history and medication portions of their EMR.     DG Chest 2 View Result Date: 06/27/2021 IMPRESSION: No active cardiopulmonary disease. Electronically Signed   By: Marcello Moores  Register   On:  06/27/2021 05:54    ROS: 14 pt review of systems performed and negative (unless mentioned in an HPI)  Objective: BP 119/75   Pulse 83   Temp 98.1 F (36.7 C) (Oral)   Ht 5' 2.5" (1.588 m)   Wt 206 lb (93.4 kg)   SpO2 100%   BMI 37.08 kg/m  Gen: Afebrile. No acute distress. Nontoxic in appearance, well-developed, well-nourished, pleasant, obese female. HENT: AT. [place]. Bilateral TM visualized and normal in appearance, normal external auditory canal. MMM, no oral lesions, adequate dentition. Bilateral nares within normal limits. Throat without erythema, ulcerations or exudates.  No cough on exam, no hoarseness on exam. Eyes:Pupils Equal Round Reactive to light, Extraocular movements intact,  Conjunctiva without redness, discharge or icterus. Neck/lymp/endocrine: Supple, no lymphadenopathy, new thyromegaly CV: RRR no murmur, no edema, +2/4 P posterior tibialis pulses.  Chest: CTAB, no wheeze, rhonchi or crackles.  Normal respiratory effort.  Good air movement. Neuro/Msk:  Normal gait. PERLA. EOMi. Alert. Oriented x3.  Psych: Normal affect, dress and demeanor. Normal speech. Normal thought content and judgment.   Assessment/plan: Kathlyn Leachman Dodge is a 25 y.o. female present for toc/follow up Reactive airway disease/seasonal allergies: Lung exam was normal today.  Vital signs stable and oxygen saturations 100%.   Normal chest x-ray and normal exam, consider possible allergies versus reactive airway disease onset after COVID illness.  She did seem to get great comfort with the prednisone. Continue Zyrtec Continue Singulair - Comp Met (CMET)> WNL - CBC w/Diff> WNL - Iron, TIBC and Ferritin Panel> WNL - DG Chest 2 View; Future> WNL Continue albuterol 1 to 2 puffs  as needed for wheezing or shortness of breath. Will follow-up in approximately 7-8 weeks and if still having difficulty at that time will consider asthma and allergy referral.    B12 deficiency: Rather significant B12  deficiency. Continue B12 sublingual 1000 mcg daily Continue B12 injections per provided scheduled Phone note. Follow-up in 7-8 weeks with provider appointment for follow-up and retesting.  Rash: Resolved with Vanos cream.  Birth controlled maintenance: Refilled her birth control pills for her while she awaits for new gynecology referral.     Return in about 2 years (around 07/21/2023) for nurse visit-b12.  Orders Placed This Encounter  Procedures   Ambulatory referral to Gynecology    Meds ordered this encounter  Medications   cyanocobalamin ((VITAMIN B-12)) injection 1,000 mcg   montelukast (SINGULAIR) 10 MG tablet    Sig: Take 1 tablet (10 mg total) by mouth at bedtime.    Dispense:  30 tablet    Refill:  5   norethindrone-ethinyl estradiol (LOESTRIN) 1-20 MG-MCG tablet    Sig: Take 1 tablet by mouth daily.    Dispense:  90 tablet    Refill:  1   Referral Orders  Ambulatory referral to Gynecology     Note is dictated utilizing voice recognition software. Although note has been proof read prior to signing, occasional typographical errors still can be missed. If any questions arise, please do not hesitate to call for verification.  Electronically signed by: Howard Pouch, DO [place]

## 2021-07-20 NOTE — Patient Instructions (Addendum)
2 weeks nurse visit b12 inj.  7-8 weeks provider appt. And lab.    Vitamin B12 Deficiency Vitamin B12 deficiency means that your body does not have enough vitamin B12. The body needs this vitamin: To make red blood cells. To make genes (DNA). To help the nerves work. If you do not have enough vitamin B12 in your body, you can have healthproblems. What are the causes? Not eating enough foods that contain vitamin B12. Not being able to absorb vitamin B12 from the food that you eat. Certain digestive system diseases. A condition in which the body does not make enough of a certain protein, which results in too few red blood cells (pernicious anemia). Having a surgery in which part of the stomach or small intestine is removed. Taking medicines that make it hard for the body to absorb vitamin B12. These medicines include: Heartburn medicines. Some antibiotic medicines. Other medicines that are used to treat certain conditions. What increases the risk? Being older than age 18. Eating a vegetarian or vegan diet, especially while you are pregnant. Eating a poor diet while you are pregnant. Taking certain medicines. Having alcoholism. What are the signs or symptoms? In some cases, there are no symptoms. If the condition leads to too few blood cells or nerve damage, symptoms can occur, such as: Feeling weak. Feeling tired (fatigued). Not being hungry. Weight loss. A loss of feeling (numbness) or tingling in your hands and feet. Redness and burning of the tongue. Being mixed up (confused) or having memory problems. Sadness (depression). Problems with your senses. This can include color blindness, ringing in the ears, or loss of taste. Watery poop (diarrhea) or trouble pooping (constipation). Trouble walking. If anemia is very bad, symptoms can include: Being short of breath. Being dizzy. Having a very fast heartbeat. How is this treated? Changing the way you eat and drink, such  as: Eating more foods that contain vitamin B12. Drinking little or no alcohol. Getting vitamin B12 shots. Taking vitamin B12 supplements. Your doctor will tell you the dose that is best for you. Follow these instructions at home: Eating and drinking  Eat lots of healthy foods that contain vitamin B12. These include: Meats and poultry, such as beef, pork, chicken, [place], and organ meats, such as liver. Seafood, such as clams, rainbow trout, salmon, tuna, and haddock. Eggs. Cereal and dairy products that have vitamin B12 added to them. Check the label. The items listed above may not be a complete list of what you can eat and drink. Contact a dietitian for more options. General instructions Get any shots as told by your doctor. Take supplements only as told by your doctor. Do not drink alcohol if your doctor tells you not to. In some cases, you may only be asked to limit alcohol use. Keep all follow-up visits as told by your doctor. This is important. Contact a doctor if: Your symptoms come back. Get help right away if: You have trouble breathing. You have a very fast heartbeat. You have chest pain. You get dizzy. You pass out. Summary Vitamin B12 deficiency means that your body is not getting enough vitamin B12. In some cases, there are no symptoms of this condition. Treatment may include making a change in the way you eat and drink, getting vitamin B12 shots, or taking supplements. Eat lots of healthy foods that contain vitamin B12. This information is not intended to replace advice given to you by your health care provider. Make sure you discuss any questions you  have with your healthcare provider. Document Revised: 08/18/2018 Document Reviewed: 08/18/2018 Elsevier Patient Education  2022 [employer].

## 2021-07-23 ENCOUNTER — Encounter: Payer: Self-pay | Admitting: Family Medicine

## 2021-07-23 DIAGNOSIS — J302 Other seasonal allergic rhinitis: Secondary | ICD-10-CM

## 2021-07-23 DIAGNOSIS — J45909 Unspecified asthma, uncomplicated: Secondary | ICD-10-CM | POA: Insufficient documentation

## 2021-07-23 DIAGNOSIS — E538 Deficiency of other specified B group vitamins: Secondary | ICD-10-CM | POA: Insufficient documentation

## 2021-07-23 HISTORY — DX: Other seasonal allergic rhinitis: J30.2

## 2021-08-02 ENCOUNTER — Other Ambulatory Visit: Payer: Self-pay

## 2021-08-02 MED ORDER — ALBUTEROL SULFATE HFA 108 (90 BASE) MCG/ACT IN AERS: 2.0000 | INHALATION_SPRAY | Freq: Four times a day (QID) | RESPIRATORY_TRACT | 1 refills | Status: DC | PRN

## 2021-08-03 ENCOUNTER — Ambulatory Visit (INDEPENDENT_AMBULATORY_CARE_PROVIDER_SITE_OTHER): Payer: Self-pay

## 2021-08-03 ENCOUNTER — Other Ambulatory Visit: Payer: Self-pay

## 2021-08-03 DIAGNOSIS — E538 Deficiency of other specified B group vitamins: Secondary | ICD-10-CM

## 2021-08-03 MED ORDER — CYANOCOBALAMIN 1000 MCG/ML IJ SOLN
1000.0000 ug | Freq: Once | INTRAMUSCULAR | Status: AC
  Administered 2021-08-03: 1000 ug via INTRAMUSCULAR

## 2021-08-03 NOTE — Progress Notes (Signed)
Per the orders of Dr. Claiborne Billings pt is here for B12 injection. Pt received injection in the left deltoid, pt tolerated injection well.

## 2021-08-09 ENCOUNTER — Encounter: Payer: Self-pay | Admitting: Family Medicine

## 2021-08-17 ENCOUNTER — Ambulatory Visit: Payer: Self-pay

## 2021-08-24 ENCOUNTER — Other Ambulatory Visit: Payer: Self-pay

## 2021-08-24 ENCOUNTER — Ambulatory Visit (INDEPENDENT_AMBULATORY_CARE_PROVIDER_SITE_OTHER): Payer: Self-pay

## 2021-08-24 DIAGNOSIS — E538 Deficiency of other specified B group vitamins: Secondary | ICD-10-CM

## 2021-08-24 MED ORDER — CYANOCOBALAMIN 1000 MCG/ML IJ SOLN
1000.0000 ug | Freq: Once | INTRAMUSCULAR | Status: AC
  Administered 2021-08-24: 1000 ug via INTRAMUSCULAR

## 2021-08-24 NOTE — Progress Notes (Signed)
Per the orders of Dr. Claiborne Billings pt is here for B12 injection. Pt received injection in the left deltoid, pt tolerated injection well.   Please sign in absence of PCP

## 2021-09-07 ENCOUNTER — Ambulatory Visit: Payer: Self-pay | Admitting: Family Medicine

## 2021-09-14 ENCOUNTER — Ambulatory Visit: Payer: Self-pay | Admitting: Family Medicine

## 2021-10-18 ENCOUNTER — Other Ambulatory Visit: Payer: Self-pay | Admitting: Family Medicine

## 2022-01-15 ENCOUNTER — Other Ambulatory Visit: Payer: Self-pay

## 2022-01-15 ENCOUNTER — Other Ambulatory Visit (INDEPENDENT_AMBULATORY_CARE_PROVIDER_SITE_OTHER): Payer: Self-pay

## 2022-01-15 ENCOUNTER — Ambulatory Visit (INDEPENDENT_AMBULATORY_CARE_PROVIDER_SITE_OTHER): Payer: Self-pay | Admitting: Family Medicine

## 2022-01-15 ENCOUNTER — Encounter: Payer: Self-pay | Admitting: Family Medicine

## 2022-01-15 VITALS — BP 108/68 | HR 67 | Temp 98.8°F | Ht 63.0 in | Wt 235.0 lb

## 2022-01-15 DIAGNOSIS — G43001 Migraine without aura, not intractable, with status migrainosus: Secondary | ICD-10-CM

## 2022-01-15 DIAGNOSIS — E559 Vitamin D deficiency, unspecified: Secondary | ICD-10-CM

## 2022-01-15 DIAGNOSIS — R002 Palpitations: Secondary | ICD-10-CM

## 2022-01-15 DIAGNOSIS — Z87820 Personal history of traumatic brain injury: Secondary | ICD-10-CM

## 2022-01-15 DIAGNOSIS — R42 Dizziness and giddiness: Secondary | ICD-10-CM

## 2022-01-15 DIAGNOSIS — E538 Deficiency of other specified B group vitamins: Secondary | ICD-10-CM

## 2022-01-15 LAB — VITAMIN B12: Vitamin B-12: 404 pg/mL (ref 211–911)

## 2022-01-15 LAB — VITAMIN D 25 HYDROXY (VIT D DEFICIENCY, FRACTURES): VITD: 26.59 ng/mL — ABNORMAL LOW (ref 30.00–100.00)

## 2022-01-15 NOTE — Progress Notes (Signed)
Patient ID: Robin Suarez, female  DOB: 08/14/1996, 26 y.o.   MRN: [id number] Patient Care Team    Relationship Specialty Notifications Start End  Ma Hillock, DO PCP - General Family Medicine  07/20/21     Chief Complaint  Patient presents with   Anemia    Vit b12 def    Subjective: Robin Suarez is a 26 y.o.  female present for follow up.  B12 deficiency/fatigue/vit d def Patient is supplementing with B12.  She states she feels better since starting the B12.  She still has some fatigue. Today she reports increase in palpitations and dizziness.  She has a history of orthostatic dizziness.  She does not consume any stimulants.  She is concerned of palpitations and would like referral to cardiology today since they are becoming more frequent and are lasting longer.  Migraine without aura and with status migrainosus, not intractable/H/O multiple concussions Patient reports she had at least 2 concussions in the past.  When she was 13 or 14 she was thrown from a horse.  She landed on her chin.  A few years ago she walked into a 2 x 4, that resulted with laceration on her scalp and a concussion.  She states she has had more frequent migraine headaches over the years.  She would like to be referred to neurology today for her more frequent migraines.   Depression screen [facility] 2/9 01/15/2022 07/20/2021 11/29/2020  Decreased Interest 0 0 0  Down, Depressed, Hopeless 0 0 0  PHQ - 2 Score 0 0 0  Altered sleeping 1 0 0  Tired, decreased energy _0 Change in appetite 0 0 0  Feeling bad or failure about yourself  0 0 0  Trouble concentrating 0 0 0  Moving slowly or fidgety/restless 0 0 0  Suicidal thoughts 0 0 0  PHQ-9 Score _1 GAD 7 : Generalized Anxiety Score 07/20/2021  Nervous, Anxious, on Edge 0  Control/stop worrying 0  Worry too much - different things 0  Trouble relaxing 0  Restless 0  Easily annoyed or irritable 0  Afraid - awful might happen 0  Total GAD 7  Score 0        Fall Risk  08/01/2020  Falls in the past year? 0  Number falls in past yr: 0  Injury with Fall? 0  Follow up Falls evaluation completed   Immunization History  Administered Date(s) Administered   DTaP 12/12/1994, 02/10/1995, 04/17/1995, 01/16/1996, 05/02/2000   Hepatitis B 05/16/94, 12/12/1994, 04/17/1995   HiB (PRP-OMP) 12/12/1994, 02/10/1995, 04/17/1995, 01/16/1996   IPV 12/12/1994, 02/10/1995, 04/17/1995, 05/02/2000   Influenza Split 09/04/2012   Influenza,inj,Quad PF,6+ Mos 11/27/2018, 08/29/2019, 08/29/2019   Influenza-Unspecified 11/01/2013, 10/06/2020   MMR 01/16/1996, 05/02/2000   Tdap 11/11/2011    No results found.  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   Migraine 04/16/2016   Nickel allergy    Obesity 11/11/2011   PT HAS LOST AT LEAST 100 LBS OVER PAST 9 TO 10 MONTHS-NO LONGER OBESE   Overactive bladder    NO LONGER A PROBLEM   PONV (postoperative nausea and vomiting)    NAUSEA AFTER ONE SURGERY AS A CHILD   Social anxiety  disorder 11/11/2011   Vesico-ureteral reflux    BLADDER REFLUX AS A CHILD - NO PROBLEM NOW   Allergies  Allergen Reactions   Cefprozil Other (See Comments)    RASH HEAD TO TOE   Gardasil 9 [Hpv 9-Valent Recomb Vaccine]     Reaction to 2nd injection. Hives. Throat symptoms.    Nickel     Rash with contact   Sulfa Antibiotics     NAUSEA, GI UPSET   Past Surgical History:  Procedure Laterality Date   broken arm  2002   left, repair of ulna and radius   CHOLECYSTECTOMY N/A 08/09/2014   Procedure: LAPAROSCOPIC CHOLECYSTECTOMY WITH INTRAOPERATIVE CHOLANGIOGRAM;  Surgeon: Gayland Curry, MD;  Location: [facility];  Service: General;  Laterality: N/A;   Tympanostomy with myringotomy Bilateral 1996   WISDOM TOOTH  EXTRACTION  10/23/2012   Family History  Problem Relation Age of Onset   Stroke Father    Heart disease Father    Depression Father    Diabetes Father        type 2   Other Father        muscular spasm of the heart   Hyperlipidemia Father    Heart attack Father    Hyperlipidemia Maternal Grandmother    Hypertension Maternal Grandmother    Diabetes Maternal Grandmother        type 2   Skin cancer Maternal Grandmother    Cancer Maternal Grandfather        laryngeal, alcohol and tobacco   Other Paternal Grandmother        arrythmia   Diabetes Paternal Grandmother        type 2   Pancreatic cancer Paternal Grandmother 71   Diabetes Paternal Grandfather        type 2   Hyperlipidemia Paternal Grandfather    Other Paternal Grandfather        fluid around the heart   Rheum arthritis Paternal Grandfather    Alzheimer's disease Paternal Grandfather    Social History   Social History Narrative   Marital status/children/pets: Single, G0P0   Education/employment: Secretary/administrator, 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 Hives. Throat symptoms.    Nickel    Rash with contact   Sulfa Antibiotics    NAUSEA, GI UPSET        Medication List        Accurate as of January 15, 2022 11:59 PM. If you have any questions, ask your nurse or doctor.          albuterol 108 (90 Base) MCG/ACT inhaler Commonly known as: VENTOLIN HFA Inhale 2 puffs into the lungs every 6 (six) hours as needed for wheezing or shortness of breath.   Cyanocobalamin 1000 MCG Subl 1 tab placed under the tongue daily.   fluocinolone 0.01 % cream Commonly known as: VANOS Apply  topically 2 (two) times daily.   montelukast 10 MG tablet Commonly known as: SINGULAIR Take 1 tablet (10 mg total) by  mouth at bedtime.   norethindrone-ethinyl estradiol 1-20 MG-MCG tablet Commonly known as: LOESTRIN Take 1 tablet by mouth daily.        All past medical history, surgical history, allergies, family history, immunizations andmedications were updated in the EMR today and reviewed under the history and medication portions of their EMR.     DG Chest 2 View Result Date: 06/27/2021 IMPRESSION: No active cardiopulmonary disease. Electronically Signed   By: Marcello Moores  Register   On: 06/27/2021 05:54    ROS: 14 pt review of systems performed and negative (unless mentioned in an HPI)  Objective: BP 108/68    Pulse 67    Temp 98.8 F (37.1 C) (Oral)    Ht _0  (1.6 m)    Wt 235 lb (106.6 kg)    SpO2 97%    BMI 41.63 kg/m  Physical Exam Vitals and nursing note reviewed.  Constitutional:      General: She is not in acute distress.    Appearance: Normal appearance. She is normal weight. She is not ill-appearing or toxic-appearing.  Eyes:     Extraocular Movements: Extraocular movements intact.     Conjunctiva/sclera: Conjunctivae normal.     Pupils: Pupils are equal, round, and reactive to light.  Cardiovascular:     Rate and Rhythm: Normal rate and regular rhythm.     Heart sounds: Normal heart sounds. No murmur heard.   No friction rub. No gallop.  Pulmonary:     Effort: Pulmonary effort is normal. No respiratory distress.     Breath sounds: Normal breath sounds. No wheezing, rhonchi or rales.  Musculoskeletal:     Right lower leg: No edema.     Left lower leg: No edema.  Neurological:     Mental Status: She is alert and oriented to person, place, and time. Mental status is at baseline.  Psychiatric:        Mood and Affect: Mood normal.        Behavior: Behavior normal.        Thought Content: Thought content normal.        Judgment: Judgment normal.      Assessment/plan: Robin Suarez is a 26 y.o. female present for toc/follow up Vitamin B12 deficiency/vitamin D deficiency Continue sublingual 1000 units vitamin B12 today.  If needed we will discuss increased dose after lab results. If not yet started, start vitamin D 1000 units daily. - B12; Future - Vitamin D (25 hydroxy); Future  Migraine without aura and with status migrainosus, not intractable/H/O multiple concussions Encourage patient to continue B12 and add a low-dose magnesium supplement before bed.  Both can help with migraine prevention. She would like referral to neurology to discuss valuation and treatment of her headaches.  She seems to be most concerned of past concussions being associated with her  worsening headaches. - Ambulatory referral to Neurology  Orthostatic dizziness/Palpitations Patient reports palpitations are having more frequently and will last longer, sometimes associated with dizziness.  Discussed referral to cardiology for monitoring, and she is agreeable to this approach today. - Ambulatory referral to Cardiology  Return if symptoms worsen or fail to improve.  Orders Placed This Encounter  Procedures   B12   Vitamin D (25 hydroxy)   Ambulatory referral to Neurology   Ambulatory referral to Cardiology    No orders of the defined types were placed in this encounter.  Referral Orders         Ambulatory referral to Neurology  Ambulatory referral to Cardiology        Note is dictated utilizing voice recognition software. Although note has been proof read prior to signing, occasional typographical errors still can be missed. If any questions arise, please do not hesitate to call for verification.  Electronically signed by: Howard Pouch, DO [place]

## 2022-01-16 ENCOUNTER — Encounter: Payer: Self-pay | Admitting: Family Medicine

## 2022-01-17 ENCOUNTER — Encounter: Payer: Self-pay | Admitting: Neurology

## 2022-01-22 ENCOUNTER — Other Ambulatory Visit: Payer: Self-pay

## 2022-01-22 ENCOUNTER — Telehealth: Payer: Self-pay

## 2022-01-22 ENCOUNTER — Emergency Department (HOSPITAL_BASED_OUTPATIENT_CLINIC_OR_DEPARTMENT_OTHER): Payer: Self-pay

## 2022-01-22 ENCOUNTER — Encounter (HOSPITAL_BASED_OUTPATIENT_CLINIC_OR_DEPARTMENT_OTHER): Payer: Self-pay | Admitting: *Deleted

## 2022-01-22 ENCOUNTER — Emergency Department (HOSPITAL_BASED_OUTPATIENT_CLINIC_OR_DEPARTMENT_OTHER)
Admission: EM | Admit: 2022-01-22 | Discharge: 2022-01-22 | Disposition: A | Discharge status: Home / Self Care | Payer: Self-pay | Attending: Emergency Medicine | Admitting: Emergency Medicine

## 2022-01-22 DIAGNOSIS — G8929 Other chronic pain: Secondary | ICD-10-CM | POA: Insufficient documentation

## 2022-01-22 DIAGNOSIS — R519 Headache, unspecified: Secondary | ICD-10-CM | POA: Insufficient documentation

## 2022-01-22 DIAGNOSIS — Z8669 Personal history of other diseases of the nervous system and sense organs: Secondary | ICD-10-CM | POA: Insufficient documentation

## 2022-01-22 DIAGNOSIS — I10 Essential (primary) hypertension: Secondary | ICD-10-CM | POA: Insufficient documentation

## 2022-01-22 LAB — BASIC METABOLIC PANEL
Anion gap: 9 (ref 5–15)
BUN: 15 mg/dL (ref 6–20)
CO2: 23 mmol/L (ref 22–32)
Calcium: 9.1 mg/dL (ref 8.9–10.3)
Chloride: 105 mmol/L (ref 98–111)
Creatinine, Ser: 0.89 mg/dL (ref 0.44–1.00)
GFR, Estimated: >60 mL/min (ref >60)
Glucose, Bld: 90 mg/dL (ref 70–99)
Potassium: 3.8 mmol/L (ref 3.5–5.1)
Sodium: 137 mmol/L (ref 135–145)

## 2022-01-22 LAB — CBC WITH DIFFERENTIAL/PLATELET
Abs Immature Granulocytes: 0.01 10*3/uL (ref 0.00–0.07)
Basophils Absolute: 0 10*3/uL (ref 0.0–0.1)
Basophils Relative: 0 %
Eosinophils Absolute: 0.2 10*3/uL (ref 0.0–0.5)
Eosinophils Relative: 3 %
HCT: 42.9 % (ref 36.0–46.0)
Hemoglobin: 15.1 g/dL — ABNORMAL HIGH (ref 12.0–15.0)
Immature Granulocytes: 0 %
Lymphocytes Relative: 51 %
Lymphs Abs: 3.4 10*3/uL (ref 0.7–4.0)
MCH: 31.5 pg (ref 26.0–34.0)
MCHC: 35.2 g/dL (ref 30.0–36.0)
MCV: 89.4 fL (ref 80.0–100.0)
Monocytes Absolute: 0.4 10*3/uL (ref 0.1–1.0)
Monocytes Relative: 7 %
Neutro Abs: 2.6 10*3/uL (ref 1.7–7.7)
Neutrophils Relative %: 39 %
Platelets: 326 10*3/uL (ref 150–400)
RBC: 4.8 MIL/uL (ref 3.87–5.11)
RDW: 11.9 % (ref 11.5–15.5)
WBC: 6.7 10*3/uL (ref 4.0–10.5)
nRBC: 0 % (ref 0.0–0.2)

## 2022-01-22 LAB — HCG, SERUM, QUALITATIVE: Preg, Serum: NEGATIVE

## 2022-01-22 MED ORDER — IOHEXOL 350 MG/ML SOLN
80.0000 mL | Freq: Once | INTRAVENOUS | Status: AC | PRN
  Administered 2022-01-22: 80 mL via INTRAVENOUS

## 2022-01-22 NOTE — Telephone Encounter (Signed)
Please call patient:  During her appointment she had asked for neurology referral to further work-up her chronic headaches.  That had been placed for her.  I have since received a message via patient's MyChart from her mother asking for further work-up here.  If she would like [place] to pursue further intervention with her headaches here, then please advise her to schedule an appointment at her earliest convenience.  We could consider an MRI if she meets criteria but there would be the need for adequate documentation in order for insurance to consider coverage.

## 2022-01-22 NOTE — Telephone Encounter (Signed)
Please advise 

## 2022-01-22 NOTE — ED Notes (Signed)
ED Provider at bedside. 

## 2022-01-22 NOTE — ED Provider Notes (Signed)
[facility] EMERGENCY DEPARTMENT Provider Note   CSN: [id number] Arrival date & time: 01/22/22  1825     History  Chief Complaint  Patient presents with   Headache    Robin Suarez is a 26 y.o. female.  HPI 26 year old female presents with headaches.  Patient states has been having headaches for months.  She thinks about 3 months.  Its not continuous but seems to occur basically every day.  Sometimes is with activity and sometimes with rest.  Feels like a pressure in her head.  Seems to be getting worse recently.  She has been referred to neurology but is not seeing them till 3/13.  No fevers.  She has a history of migraines as well that is different than the headache she is experiencing and feels like she has been getting some visual auras like her migraines but no headache follows them.  Otherwise no fevers, vomiting, focal weakness or numbness.  Ibuprofen and Tylenol temporarily helps the pain.  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 She rates the pain as a 4 currently.  Home Medications Prior to Admission medications   Medication Sig Start Date End Date Taking? Authorizing Provider  albuterol (VENTOLIN HFA) 108 (90 Base) MCG/ACT inhaler Inhale 2 puffs into the lungs every 6 (six) hours as needed for wheezing or shortness of breath. 08/02/21   Kuneff, Renee A, DO  Cyanocobalamin 1000 MCG SUBL 1 tab placed under the tongue daily. 06/27/21   Kuneff, Renee A, DO  fluocinolone (VANOS) 0.01 % cream Apply topically 2 (two) times daily. 06/26/21   Kuneff, Renee A, DO  montelukast (SINGULAIR) 10 MG tablet Take 1 tablet (10 mg total) by mouth at bedtime. 07/20/21   Kuneff, Renee A, DO  norethindrone-ethinyl estradiol (LOESTRIN) 1-20 MG-MCG tablet Take 1 tablet by mouth daily. 07/20/21   Kuneff, Renee A, DO      Allergies    Cefprozil, Gardasil 9 [hpv 9-valent recomb vaccine], Nickel, and Sulfa antibiotics    Review of Systems   Review of  Systems  Constitutional:  Negative for fever.  Eyes:  Positive for visual disturbance.  Gastrointestinal:  Negative for vomiting.  Neurological:  Positive for headaches. Negative for weakness and numbness.   Physical Exam Updated Vital Signs BP 122/80 (BP Location: Left Arm)    Pulse 79    Temp 98.5 F (36.9 C) (Oral)    Resp 16    Ht 5\' 4"  (1.626 m)    Wt 104.3 kg    LMP 01/22/2022    SpO2 98%    BMI 39.48 kg/m  Physical Exam Vitals and nursing note reviewed.  Constitutional:      General: She is not in acute distress.    Appearance: She is well-developed. She is not ill-appearing or diaphoretic.  HENT:     Head: Normocephalic and atraumatic.  Eyes:     Extraocular Movements: Extraocular movements intact.     Pupils: Pupils are equal, round, and reactive to light.  Cardiovascular:     Rate and Rhythm: Normal rate and regular rhythm.     Heart sounds: Normal heart sounds.  Pulmonary:     Effort: Pulmonary effort is normal.     Breath sounds: Normal breath sounds.  Abdominal:     Palpations: Abdomen is soft.     Tenderness: There is no abdominal tenderness.  Musculoskeletal:     Cervical back: No rigidity.  Skin:    General: Skin  is warm and dry.  Neurological:     Mental Status: She is alert.     Comments: CN 3-12 grossly intact. 5/5 strength in all 4 extremities. Grossly normal sensation. Normal finger to nose.     ED Results / Procedures / Treatments   Labs (all labs ordered are listed, but only abnormal results are displayed) Labs Reviewed  CBC WITH DIFFERENTIAL/PLATELET - Abnormal; Notable for the following components:      Result Value   Hemoglobin 15.1 (*)    All other components within normal limits  BASIC METABOLIC PANEL  HCG, SERUM, QUALITATIVE    EKG None  Radiology CT VENOGRAM HEAD  Result Date: 01/22/2022 CLINICAL DATA:  Binocular vision loss EXAM: CT VENOGRAM HEAD TECHNIQUE: Venographic phase images of the brain were obtained following the  administration of intravenous contrast. Multiplanar reformats and maximum intensity projections were generated. RADIATION DOSE REDUCTION: This exam was performed according to the departmental dose-optimization program which includes automated exposure control, adjustment of the mA and/or kV according to patient size and/or use of iterative reconstruction technique. CONTRAST:  54mL OMNIPAQUE IOHEXOL 350 MG/ML SOLN COMPARISON:  02/08/2017. FINDINGS: No evidence of venous sinus thrombosis. Apparent narrowing of the distal transverse sinuses at the transverse-sigmoid junction. Brain: No evidence of acute infarction, hemorrhage, cerebral edema, mass, mass effect, or midline shift. No hydrocephalus or extra-axial fluid collection. Partial empty sella. Vascular: No hyperdense vessel. Skull: Normal. Negative for fracture or focal lesion. Sinuses/Orbits: No acute finding. Other: The mastoid air cells are well aerated. IMPRESSION: 1. No evidence of venous sinus thrombosis. 2. Narrowing of the distal transverse venous sinuses at the transverse-sigmoid junction, with partial empty sella, which can both the seen in the setting of intracranial hypertension. Correlate with symptoms and consider CSF with opening pressure. Electronically Signed   By: Merilyn Baba M.D.   On: 01/22/2022 22:14    Procedures Procedures    Medications Ordered in ED Medications  iohexol (OMNIPAQUE) 350 MG/ML injection 80 mL (80 mLs Intravenous Contrast Given 01/22/22 2126)    ED Course/ Medical Decision Making/ A&P                           Medical Decision Making Amount and/or Complexity of Data Reviewed Labs: ordered. Radiology: ordered.  Risk Prescription drug management.   Patient is quite well appearing. Neuro exam is benign. Because she has had intermittent visual complaints, prolonged headache, and estrogen use, a CTV was obtained. CT head shows no acute head bleed on my read. Radiology questions narrowing of transverse  sinuses and partially empty sella.  While these could be seen with intracranial hypertension, she is not having active visual symptoms now (her visual symptoms she describes on and off seem to be positive symptoms) and I did a funduscopic exam which showed normal fundus.  Thus, especially given the chronicity, I do not think she needs an emergent LP.  I think she can follow-up with neurology unless her symptoms worsen.  Discussed this with patient and mom at the bedside and they agree to hold off for now.  She appears well.  I highly doubt CNS infection.  Discharged home with return precautions.        Final Clinical Impression(s) / ED Diagnoses Final diagnoses:  Chronic nonintractable headache, unspecified headache type    Rx / DC Orders ED Discharge Orders     None         Sherwood Gambler, MD 01/22/22 2334

## 2022-01-22 NOTE — Telephone Encounter (Signed)
LVM for pt to CB regarding message received from mother.

## 2022-01-22 NOTE — Discharge Instructions (Signed)
If your headache worsens, you develop vomiting, fever, neck stiffness, blurry or double vision or any other vision complaints, or any other new/concerning symptoms then return to the ER for evaluation.  Otherwise follow-up with the neurologist.

## 2022-01-22 NOTE — Telephone Encounter (Signed)
Patient mother Gloris [facility]) calling in regards to pressure in patient's head getting worse.  Patient does not see nerologist until 3/13. Mom is just throwing this out there, because there was some talk about MRI.  Mom is wanting to know if Dr. Claiborne Billings will go ahead and order MRI so that results will be back before 3/13 appt with Dr. Everlena Cooper.

## 2022-01-22 NOTE — ED Triage Notes (Addendum)
C/o cont h/a and " head pressure"  , appt with neuro 3/13 to address same

## 2022-01-23 ENCOUNTER — Telehealth: Payer: Self-pay | Admitting: Family Medicine

## 2022-01-23 NOTE — Telephone Encounter (Signed)
Ragin, Kenae C 12 minutes ago (8:17 AM)    Pt mother called.   Pt is having pressure on right side of head & neck pain & skin rash. Pt went to [facility] last night  they did a contrast CT, then spinal test. I mentioned that she should be seen in order for questions to be answered. Pt confused on what to do   Pt cell: [phone]

## 2022-01-23 NOTE — Telephone Encounter (Signed)
Corrected pt appt. Hospital f/u on 01/25/22

## 2022-01-23 NOTE — Telephone Encounter (Signed)
Please see other encounter.

## 2022-01-23 NOTE — Telephone Encounter (Signed)
Pt called wanted to confirm which date her hospital appointment is scheduled for.  Pt said it was scheduled for 02/03 at 11:00am, however it's scheduled for 02/06.  Pt [phone]

## 2022-01-23 NOTE — Telephone Encounter (Signed)
Pt scheduled for Hospital follow up on 01/25/22

## 2022-01-23 NOTE — Telephone Encounter (Signed)
Pt mother called.  Pt is having pressure on right side of head & neck pain & skin rash. Pt went to [facility] last night  they did a contrast CT, then spinal test. I mentioned that she should be seen in order for questions to be answered. Pt confused on what to do  Pt cell: [phone]

## 2022-01-24 ENCOUNTER — Other Ambulatory Visit: Payer: Self-pay

## 2022-01-25 ENCOUNTER — Encounter (HOSPITAL_BASED_OUTPATIENT_CLINIC_OR_DEPARTMENT_OTHER): Payer: Self-pay | Admitting: *Deleted

## 2022-01-25 ENCOUNTER — Ambulatory Visit: Payer: Self-pay | Admitting: Family Medicine

## 2022-01-25 ENCOUNTER — Telehealth: Payer: Self-pay

## 2022-01-25 ENCOUNTER — Encounter: Payer: Self-pay | Admitting: Family Medicine

## 2022-01-25 ENCOUNTER — Ambulatory Visit (INDEPENDENT_AMBULATORY_CARE_PROVIDER_SITE_OTHER): Payer: Self-pay | Admitting: Family Medicine

## 2022-01-25 ENCOUNTER — Emergency Department (HOSPITAL_BASED_OUTPATIENT_CLINIC_OR_DEPARTMENT_OTHER)
Admission: EM | Admit: 2022-01-25 | Discharge: 2022-01-25 | Disposition: A | Discharge status: Home / Self Care | Payer: Self-pay | Attending: Emergency Medicine | Admitting: Emergency Medicine

## 2022-01-25 ENCOUNTER — Other Ambulatory Visit: Payer: Self-pay

## 2022-01-25 VITALS — BP 137/87 | HR 100 | Ht 64.0 in | Wt 230.0 lb

## 2022-01-25 DIAGNOSIS — G8929 Other chronic pain: Secondary | ICD-10-CM

## 2022-01-25 DIAGNOSIS — G43809 Other migraine, not intractable, without status migrainosus: Secondary | ICD-10-CM | POA: Insufficient documentation

## 2022-01-25 DIAGNOSIS — Z79899 Other long term (current) drug therapy: Secondary | ICD-10-CM | POA: Insufficient documentation

## 2022-01-25 DIAGNOSIS — R21 Rash and other nonspecific skin eruption: Secondary | ICD-10-CM

## 2022-01-25 DIAGNOSIS — M542 Cervicalgia: Secondary | ICD-10-CM | POA: Insufficient documentation

## 2022-01-25 DIAGNOSIS — R519 Headache, unspecified: Secondary | ICD-10-CM

## 2022-01-25 MED ORDER — METHOCARBAMOL 500 MG PO TABS: 500.0000 mg | ORAL_TABLET | Freq: Two times a day (BID) | ORAL | 0 refills | Status: DC

## 2022-01-25 MED ORDER — DEXAMETHASONE SODIUM PHOSPHATE 10 MG/ML IJ SOLN
10.0000 mg | Freq: Once | INTRAMUSCULAR | Status: AC
  Administered 2022-01-25: 10 mg via INTRAVENOUS
  Filled 2022-01-25: qty 1

## 2022-01-25 MED ORDER — METOCLOPRAMIDE HCL 5 MG/ML IJ SOLN
10.0000 mg | Freq: Once | INTRAMUSCULAR | Status: AC
Start: 2022-01-25 — End: 2022-01-25
  Administered 2022-01-25: 10 mg via INTRAVENOUS
  Filled 2022-01-25: qty 2

## 2022-01-25 MED ORDER — DEXAMETHASONE SODIUM PHOSPHATE 10 MG/ML IJ SOLN: 10.0000 mg | Freq: Once | INTRAMUSCULAR | Status: DC

## 2022-01-25 MED ORDER — ACETAMINOPHEN 325 MG PO TABS
650.0000 mg | ORAL_TABLET | Freq: Once | ORAL | Status: AC
  Administered 2022-01-25: 650 mg via ORAL
  Filled 2022-01-25: qty 2

## 2022-01-25 MED ORDER — SODIUM CHLORIDE 0.9 % IV BOLUS
500.0000 mL | Freq: Once | INTRAVENOUS | Status: AC
  Administered 2022-01-25: 500 mL via INTRAVENOUS

## 2022-01-25 MED ORDER — DIPHENHYDRAMINE HCL 50 MG/ML IJ SOLN
25.0000 mg | Freq: Once | INTRAMUSCULAR | Status: AC
  Administered 2022-01-25: 25 mg via INTRAVENOUS
  Filled 2022-01-25: qty 1

## 2022-01-25 NOTE — ED Provider Notes (Signed)
[facility] Provider Note   CSN: [id number] Arrival date & time: 01/25/22  1207     History  Chief Complaint  Patient presents with   Migraine   Headache    Robin Suarez is a 26 y.o. female with a past medical history of B12 deficiency, migraines presenting to the ED with a chief complaint of headache.  She has had intermittent headaches for the past 3 months.  On 01/22/2022 started experiencing slight neck pain in addition to the headache.  She has had a pruritic rash to her abdomen, right forearm for the past 3 weeks.  She has had similar rash in the past with her nickel allergy.  She denies any new environmental exposures.  Patient was evaluated in the ER on 01/22/2022, CT venogram was done which showed partial empty sella concerning for possible intracranial hypertension.  She had no vision abnormalities and was referred to neurology.  She is scheduled to see them in 1 month.  She followed up with her PCP today and was sent to the ER for further evaluation due to persistent symptoms.  She denies any head injury, loss of consciousness, blurry vision, numbness in arms or legs, changes to gait, fever or vomiting.  No sick contacts with similar symptoms   Migraine Associated symptoms include headaches. Pertinent negatives include no chest pain, no abdominal pain and no shortness of breath.  Headache Associated symptoms: no abdominal pain, no cough, no diarrhea, no dizziness, no ear pain, no fever, no myalgias, no nausea, no photophobia, no sore throat, no vomiting and no weakness       Home Medications Prior to Admission medications   Medication Sig Start Date End Date Taking? Authorizing Provider  methocarbamol (ROBAXIN) 500 MG tablet Take 1 tablet (500 mg total) by mouth 2 (two) times daily. 01/25/22  Yes Latoia Eyster, PA-C  albuterol (VENTOLIN HFA) 108 (90 Base) MCG/ACT inhaler Inhale 2 puffs into the lungs every 6 (six) hours as needed for wheezing or  shortness of breath. 08/02/21   Kuneff, Renee A, DO  Cyanocobalamin 1000 MCG SUBL 1 tab placed under the tongue daily. 06/27/21   Kuneff, Renee A, DO  fluocinolone (VANOS) 0.01 % cream Apply topically 2 (two) times daily. 06/26/21   Kuneff, Renee A, DO  Magnesium 250 MG TABS     [provider]  montelukast (SINGULAIR) 10 MG tablet Take 1 tablet (10 mg total) by mouth at bedtime. 07/20/21   Kuneff, Renee A, DO  norethindrone-ethinyl estradiol (LOESTRIN) 1-20 MG-MCG tablet Take 1 tablet by mouth daily. 07/20/21   Kuneff, Renee A, DO  Vitamin D, Cholecalciferol, 25 MCG (1000 UT) CAPS     [provider]      Allergies    Cefprozil, Gardasil 9 [hpv 9-valent recomb vaccine], Nickel, and Sulfa antibiotics    Review of Systems   Review of Systems  Constitutional:  Negative for appetite change, chills and fever.  HENT:  Negative for ear pain, rhinorrhea, sneezing and sore throat.   Eyes:  Negative for photophobia and visual disturbance.  Respiratory:  Negative for cough, chest tightness, shortness of breath and wheezing.   Cardiovascular:  Negative for chest pain and palpitations.  Gastrointestinal:  Negative for abdominal pain, blood in stool, constipation, diarrhea, nausea and vomiting.  Genitourinary:  Negative for dysuria, hematuria and urgency.  Musculoskeletal:  Negative for myalgias.  Skin:  Positive for rash.  Neurological:  Positive for headaches. Negative for dizziness, weakness and light-headedness.  Physical Exam Updated Vital Signs BP 100/62    Pulse 81    Temp 98.8 F (37.1 C) (Oral)    Resp 17    Ht 5\' 4"  (1.626 m)    Wt 104.3 kg    LMP 01/22/2022 Comment: Shielded   SpO2 98%    BMI 39.47 kg/m  Physical Exam Vitals and nursing note reviewed.  Constitutional:      General: She is not in acute distress.    Appearance: She is well-developed.     Comments: Alert, no signs of distress. Does not appear toxic.  No signs of angioedema or anaphylaxis  HENT:     Head:  Normocephalic and atraumatic.     Nose: Nose normal.  Eyes:     General: No scleral icterus.       Right eye: No discharge.        Left eye: No discharge.     Conjunctiva/sclera: Conjunctivae normal.     Pupils: Pupils are equal, round, and reactive to light.  Neck:     Comments: No meningismus. Cardiovascular:     Rate and Rhythm: Normal rate and regular rhythm.     Heart sounds: Normal heart sounds. No murmur heard.   No friction rub. No gallop.  Pulmonary:     Effort: Pulmonary effort is normal. No respiratory distress.     Breath sounds: Normal breath sounds.  Abdominal:     General: Bowel sounds are normal. There is no distension.     Palpations: Abdomen is soft.     Tenderness: There is no abdominal tenderness. There is no guarding.  Musculoskeletal:        General: Normal range of motion.     Cervical back: Normal range of motion and neck supple.  Skin:    General: Skin is warm and dry.     Findings: Rash present.     Comments: Faint, blanchable papular rash on torso, R forearm. No tenderness.  Neurological:     General: No focal deficit present.     Mental Status: She is alert and oriented to person, place, and time.     Cranial Nerves: No cranial nerve deficit.     Sensory: No sensory deficit.     Motor: No weakness or abnormal muscle tone.     Coordination: Coordination normal.     Comments: Pupils reactive. No facial asymmetry noted. Cranial nerves appear grossly intact. Sensation intact to light touch on face, BUE and BLE. Strength 5/5 in BUE and BLE. No pronator drift.       ED Results / Procedures / Treatments   Labs (all labs ordered are listed, but only abnormal results are displayed) Labs Reviewed - No data to display  EKG None  Radiology No results found.  Procedures Procedures    Medications Ordered in ED Medications  metoCLOPramide (REGLAN) injection 10 mg (10 mg Intravenous Given 01/25/22 1326)  diphenhydrAMINE (BENADRYL) injection 25 mg  (25 mg Intravenous Given 01/25/22 1320)  sodium chloride 0.9 % bolus 500 mL (0 mLs Intravenous Stopped 01/25/22 1426)  acetaminophen (TYLENOL) tablet 650 mg (650 mg Oral Given 01/25/22 1320)  dexamethasone (DECADRON) injection 10 mg (10 mg Intravenous Given 01/25/22 1323)    ED Course/ Medical Decision Making/ A&P                           Medical Decision Making Risk OTC drugs. Prescription drug management.   26 year old female presenting to  the ED for continued headache.  Seen and evaluated on 01/22/2022, diagnosed with possible intracranial hypertension based on CT venogram showing partial empty sella.  She is scheduled to see neurology next month.  She followed up with PCP today and was sent to the ER due to concern to continue symptoms.  She has also been having neck pain since symptoms began earlier this week.  Her rash has been going on for the past 2 weeks and describes it as pruritic and similar to when she experienced her reaction to nickel.  Minimal improvement noted with topical steroids at home.  On exam patient without neurological deficits.  No numbness or weakness on exam.  She has no meningeal signs, she has normal range of motion.  She is nontoxic-appearing and she is in no acute distress.  Her vital signs are within normal limits.  She is ambulating without difficulty.  Her rash is a faint papular rash without petechiae.  Reviewed imaging from last visit.  She continues to deny any visual symptoms.  We will treat with migraine cocktail and reassess.  On repeat assessment patient symptoms have resolved.  States that her rash has improved.  Her headache has improved as well.  She continues to have normal range of motion of neck and states that her pain has significantly resolved.  I suspect that her symptoms could be due to migraine and muscle strain.  Unsure of exact cause of her rash although this seems to be responding to systemic steroids.  I do feel that she will still benefit from  neurology follow-up due to her CT findings but at this time do not feel the need to complete a lumbar puncture and patient agrees.  I doubt CNS infection based on the duration of symptoms and her overall appearance today as well as lack of severe systemic illness.  Patient is agreeable to the plan and is requesting discharge.  Will discharge with short course of muscle relaxers I feel that her neck pain is more so musculoskeletal based on her physical exam findings.  Return precautions given. Patient discussed with the attending, Dr. Ronnald Nian.  Patient is hemodynamically stable, in NAD, and able to ambulate in the ED. Evaluation does not show pathology that would require ongoing emergent intervention or inpatient treatment. I explained the diagnosis to the patient. Pain has been managed and has no complaints prior to discharge. Patient is comfortable with above plan and is stable for discharge at this time. All questions were answered prior to disposition. Strict return precautions for returning to the ED were discussed. Encouraged follow up with PCP.   An After Visit Summary was printed and given to the patient.   Portions of this note were generated with Lobbyist. Dictation errors may occur despite best attempts at proofreading.        Final Clinical Impression(s) / ED Diagnoses Final diagnoses:  Other migraine without status migrainosus, not intractable  Rash and nonspecific skin eruption    Rx / DC Orders ED Discharge Orders          Ordered    methocarbamol (ROBAXIN) 500 MG tablet  2 times daily        01/25/22 1424              Delia Heady, PA-C 01/25/22 1427    Lennice Sites, DO 01/25/22 1450

## 2022-01-25 NOTE — Patient Instructions (Signed)
Given your worsening symptoms, recommend you return to the ED since we cannot get in with neurology any sooner.  Please follow-up with PCP and neurology as soon as possible.

## 2022-01-25 NOTE — Progress Notes (Signed)
Acute Office Visit  Subjective:    Patient ID: Robin Suarez, female    DOB: 07-Nov-1996, 26 y.o.   MRN: [id number]  CC: hospital follow-up, headaches   HPI Patient is in today for   01/22/22 ED: Daily headaches x 3 months. States these are different than her usual migraines. She has an appointment with Neuro in March. Normal neuro exam. CT head with no signs of bleeding, possible narrowing of transverse sinuses and partially empty sella, but was not having any other symptoms of intracranial HTN so urgent LP was deferred. She was discharged to follow-up with Neuro and PCP. CBC, CMP, B12 were all normal. 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   Patient was supposed to see PCP today, but had to change d/t office emergency. Will forward note to PCP.    Today she reports headache is worsening, still feels like fluctuating pressure 4-8/10, but she is now having neck pain as well that is currently 5/10, denies any stiffness. States she noticed a rash pop up within the past several days as well to her right forearm, stomach, and neck that is small red papules and very itchy, no drainage. She is getting very concerned, and admits that the worsening symptoms are making her anxious which makes her headaches even worse. Denies any chest pain, vision changes, nausea, vomiting, fevers.     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   Migraine 04/16/2016   Nickel allergy    Obesity 11/11/2011   PT HAS LOST AT LEAST 100 LBS OVER PAST 9 TO 10 MONTHS-NO LONGER OBESE   Overactive bladder    NO LONGER A PROBLEM   PONV (postoperative nausea and vomiting)    NAUSEA AFTER ONE SURGERY AS A CHILD   Social anxiety disorder 11/11/2011   Vesico-ureteral reflux    BLADDER REFLUX AS A CHILD -  NO PROBLEM NOW    Past Surgical History:  Procedure Laterality Date   broken arm  2002   left, repair of ulna and radius   CHOLECYSTECTOMY N/A 08/09/2014   Procedure: LAPAROSCOPIC CHOLECYSTECTOMY WITH INTRAOPERATIVE CHOLANGIOGRAM;  Surgeon: Atilano Ina, MD;  Location: [facility];  Service: General;  Laterality: N/A;   Tympanostomy with myringotomy Bilateral 1996   WISDOM TOOTH EXTRACTION  10/23/2012    Family History  Problem Relation Age of Onset   Stroke Father    Heart disease Father    Depression Father    Diabetes Father        type 2   Other Father        muscular spasm of the heart   Hyperlipidemia Father    Heart attack Father    Hyperlipidemia Maternal Grandmother    Hypertension Maternal Grandmother    Diabetes Maternal Grandmother        type 2   Skin cancer Maternal Grandmother    Cancer Maternal Grandfather        laryngeal, alcohol and tobacco   Other Paternal Grandmother        arrythmia   Diabetes Paternal Grandmother        type 2   Pancreatic cancer Paternal Grandmother 41   Diabetes Paternal Grandfather        type 2   Hyperlipidemia  Paternal Grandfather    Other Paternal Grandfather        fluid around the heart   Rheum arthritis Paternal Grandfather    Alzheimer's disease Paternal Grandfather     Social History   Socioeconomic History   Marital status: Single    Spouse name: Not on file   Number of children: Not on file   Years of education: Not on file   Highest education level: Associate degree: academic program  Occupational History   Not on file  Tobacco Use   Smoking status: Never    Passive exposure: Never   Smokeless tobacco: Never  Vaping Use   Vaping Use: Never used  Substance and Sexual Activity   Alcohol use: Yes    Comment: occ   Drug use: No   Sexual activity: Yes    Birth control/protection: Pill  Other Topics Concern   Not on file  Social History Narrative   Marital status/children/pets: Single, [id number]    Education/employment: Automotive engineer, 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:      -Wears a bicycle helmet riding a bike: Yes     -smoke alarm in the home:Yes     - wears seatbelt: Yes     - Feels safe in their relationships: Yes      Social Determinants of Corporate investment banker Strain: Low Risk    Difficulty of Paying Living Expenses: Not hard at all  Food Insecurity: No Food Insecurity   Worried About Programme researcher, broadcasting/film/video in the Last Year: Never true   Barista in the Last Year: Never true  Transportation Needs: No Transportation Needs   Lack of Transportation (Medical): No   Lack of Transportation (Non-Medical): No  Physical Activity: Sufficiently Active   Days of Exercise per Week: 7 days   Minutes of Exercise per Session: 60 min  Stress: No Stress Concern Present   Feeling of Stress : Not at all  Social Connections: Moderately Isolated   Frequency of Communication with Friends and Family: More than three times a week   Frequency of Social Gatherings with Friends and Family: More than three times a week   Attends Religious Services: More than 4 times per year   Active Member of [employer] or Organizations: No   Attends Engineer, structural: Not on file   Marital Status: Never married  Intimate Partner Violence: Not on file    Outpatient Medications Prior to Visit  Medication Sig Dispense Refill   albuterol (VENTOLIN HFA) 108 (90 Base) MCG/ACT inhaler Inhale 2 puffs into the lungs every 6 (six) hours as needed for wheezing or shortness of breath. 8 g 1   Cyanocobalamin 1000 MCG SUBL 1 tab placed under the tongue daily. 90 tablet 3   fluocinolone (VANOS) 0.01 % cream Apply topically 2 (two) times daily. 30 g 0   Magnesium 250 MG TABS      montelukast (SINGULAIR) 10 MG tablet Take 1 tablet (10 mg total) by mouth at bedtime. 30 tablet 5   norethindrone-ethinyl estradiol (LOESTRIN) 1-20 MG-MCG tablet Take 1 tablet by mouth daily. 90 tablet 1   Vitamin D,  Cholecalciferol, 25 MCG (1000 UT) CAPS      No facility-administered medications prior to visit.    Allergies  Allergen Reactions   Cefprozil Other (See Comments)    RASH HEAD TO TOE   Gardasil 9 [Hpv 9-Valent Recomb Vaccine]     Reaction to 2nd injection. Hives.  Throat symptoms.    Nickel     Rash with contact   Sulfa Antibiotics     NAUSEA, GI UPSET    Review of Systems All review of systems negative except what is listed in the HPI     Objective:    Physical Exam HENT:     Head: Normocephalic and atraumatic.  Eyes:     Extraocular Movements: Extraocular movements intact.     Conjunctiva/sclera: Conjunctivae normal.     Pupils: Pupils are equal, round, and reactive to light.  Cardiovascular:     Rate and Rhythm: Normal rate and regular rhythm.  Pulmonary:     Effort: Pulmonary effort is normal.     Breath sounds: Normal breath sounds.  Abdominal:     General: Abdomen is flat. Bowel sounds are normal.     Palpations: Abdomen is soft.  Musculoskeletal:        General: Normal range of motion.     Cervical back: Normal range of motion and neck supple. No rigidity.  Lymphadenopathy:     Cervical: No cervical adenopathy.  Skin:    General: Skin is warm and dry.     Comments: See picture of rash to right forearm, abdomen, and neck  Neurological:     General: No focal deficit present.     Mental Status: She is alert and oriented to person, place, and time. Mental status is at baseline.     GCS: GCS eye subscore is 4. GCS verbal subscore is 5. GCS motor subscore is 6.     Cranial Nerves: Cranial nerves 2-12 are intact.     Sensory: Sensation is intact.     Motor: Motor function is intact.     Coordination: Coordination is intact.     Gait: Gait is intact.     Comments: Negative Kernig's and Brudzinski's   Psychiatric:        Mood and Affect: Mood normal.        Behavior: Behavior normal.        Thought Content: Thought content normal.        Judgment: Judgment  normal.           BP 137/87    Pulse 100    Ht 5\' 4"  (1.626 m)    Wt 230 lb (104.3 kg)    LMP 01/22/2022 Comment: Shielded   BMI 39.48 kg/m  Wt Readings from Last 3 Encounters:  01/25/22 230 lb (104.3 kg)  01/22/22 230 lb (104.3 kg)  01/15/22 235 lb (106.6 kg)    Health Maintenance Due  Topic Date Due   HIV Screening  Never done   TETANUS/TDAP  11/10/2021    There are no preventive care reminders to display for this patient.   Lab Results  Component Value Date   TSH 2.39 11/27/2018   Lab Results  Component Value Date   WBC 6.7 01/22/2022   HGB 15.1 (H) 01/22/2022   HCT 42.9 01/22/2022   MCV 89.4 01/22/2022   PLT 326 01/22/2022   Lab Results  Component Value Date   NA 137 01/22/2022   K 3.8 01/22/2022   CO2 23 01/22/2022   GLUCOSE 90 01/22/2022   BUN 15 01/22/2022   CREATININE 0.89 01/22/2022   BILITOT 0.8 06/26/2021   ALKPHOS 77 06/26/2021   AST 19 06/26/2021   ALT 21 06/26/2021   PROT 6.5 06/26/2021   ALBUMIN 4.2 06/26/2021   CALCIUM 9.1 01/22/2022   ANIONGAP 9 01/22/2022   GFR  84.71 06/26/2021   No results found for: CHOL No results found for: HDL No results found for: LDLCALC No results found for: TRIG No results found for: CHOLHDL No results found for: WUJW1X     Assessment & Plan:   1. Chronic nonintractable headache, unspecified headache type 2. Neck pain 3. Rash Discussed options with patient. She really needs to see neuro and has already tried moving the appointment sooner, but was unsuccessful. Given that her symptoms are worsening, recommend she go to the ED for further workup. Patient and parents pleasant and agreeable. Recommend she PCP and neurology as soon as possible. Patient aware of signs/symptoms requiring further/urgent evaluation.    Clayborne Dana, NP

## 2022-01-25 NOTE — ED Triage Notes (Signed)
Headaches on and off x 3 months. Hx of migraines. She has been seeing her MD for the headaches with no improvement.

## 2022-01-25 NOTE — Telephone Encounter (Signed)
Looks like pt now sees Dr Claiborne Billings and is scheduled at her office today.   [facility] Night - Client Client Site [facility] - Night Provider Danise Edge - MD Contact Type Call Who Is Calling Patient / Member / Family / Caregiver Caller Name [name] Caller Phone Number [phone] Patient Name Robin Suarez Patient DOB 1996/03/17 Call Type Message Only Information Provided Reason for Call Request to Schedule Office Appointment Initial Comment Caller states she needs to make an appt with Dr. Abner Greenspan ASAP because she was seen in the ER. Patient request to speak to RN No Disp. Time Disposition Final User 01/25/2022 7:52:27 AM General Information Provided Yes Doran Stabler Call Closed By: Doran Stabler Transaction Date/Time: 01/25/2022 7:50:34 AM (ET)

## 2022-01-25 NOTE — Discharge Instructions (Addendum)
Take the medicine as needed to help with muscle pain. Continue Tylenol and Motrin to help with your headache. You will need to follow-up with neurology.   Have given you a steroid to help with your rash. Return to the ER if you start to experience worsening symptoms, blurry vision, numbness in arms or legs, trouble walking, fever, worsening rash, lip or tongue swelling.

## 2022-01-25 NOTE — ED Notes (Signed)
ED Provider at bedside. 

## 2022-01-28 ENCOUNTER — Ambulatory Visit: Payer: Self-pay | Admitting: Family Medicine

## 2022-01-29 ENCOUNTER — Other Ambulatory Visit: Payer: Self-pay

## 2022-01-30 ENCOUNTER — Encounter: Payer: Self-pay | Admitting: Family Medicine

## 2022-01-30 ENCOUNTER — Ambulatory Visit (INDEPENDENT_AMBULATORY_CARE_PROVIDER_SITE_OTHER): Payer: Self-pay | Admitting: Family Medicine

## 2022-01-30 VITALS — BP 108/72 | HR 92 | Temp 98.4°F | Ht 64.0 in | Wt 231.0 lb

## 2022-01-30 DIAGNOSIS — R519 Headache, unspecified: Secondary | ICD-10-CM

## 2022-01-30 DIAGNOSIS — G43001 Migraine without aura, not intractable, with status migrainosus: Secondary | ICD-10-CM

## 2022-01-30 DIAGNOSIS — R21 Rash and other nonspecific skin eruption: Secondary | ICD-10-CM

## 2022-01-30 MED ORDER — TOPIRAMATE 25 MG PO TABS: 25.0000 mg | ORAL_TABLET | Freq: Every day | ORAL | 1 refills | Status: DC

## 2022-01-30 MED ORDER — DOXYCYCLINE HYCLATE 100 MG PO TABS: 100.0000 mg | ORAL_TABLET | Freq: Two times a day (BID) | ORAL | 0 refills | Status: DC

## 2022-01-30 NOTE — Progress Notes (Signed)
Patient ID: Robin Suarez, female  DOB: 1996/12/20, 26 y.o.   MRN: [id number] Patient Care Team    Relationship Specialty Notifications Start End  Ma Hillock, DO PCP - General Family Medicine  07/20/21   Obgyn, Erling Conte    01/30/22     Chief Complaint  Patient presents with   Migraine    ED f/u    Subjective: Robin Suarez is a 26 y.o.  female present for ED follow up.  Migraine without aura and with status migrainosus, not intractable/H/O multiple concussions Since her last visit patient has been to the ED twice for headaches.  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  There was mention of "apparent narrowing of the distal transverse sinuses at the transverse sigmoid junction.  "There was mention both of the above can be seen in patients with intracranial hypertension.  Patient is currently taking birth control pills,Body mass index is 39.65 kg/m. Her neurological exams have been normal and patient has appeared well per ED notes on both visits.  Funduscopic exam in ED did not result with papilledema.  She does have neurology referral placed for her an appointment scheduled, although not for another month. She had reported a rash during her ED visit.  She states it started on her right forearm and went to her trunk.  This has since been resolved with steroid prescription. She reports today pressure mid forehead.  She endorses having headaches with left Aura Prior note: Patient reports she had at least 2 concussions in the past.  When she was 13 or 14 she was thrown from a horse.  She landed on her chin.  A few years ago she walked into a 2 x 4, that resulted with laceration on her scalp and a concussion.  She states she has had more frequent migraine headaches over the years.  She would like to be referred to neurology today for her more frequent migraines.   Depression screen [facility] 2/9 01/15/2022 07/20/2021  11/29/2020  Decreased Interest 0 0 0  Down, Depressed, Hopeless 0 0 0  PHQ - 2 Score 0 0 0  Altered sleeping 1 0 0  Tired, decreased energy _0 Change in appetite 0 0 0  Feeling bad or failure about yourself  0 0 0  Trouble concentrating 0 0 0  Moving slowly or fidgety/restless 0 0 0  Suicidal thoughts 0 0 0  PHQ-9 Score _1 GAD 7 : Generalized Anxiety Score 07/20/2021  Nervous, Anxious, on Edge 0  Control/stop worrying 0  Worry too much - different things 0  Trouble relaxing 0  Restless 0  Easily annoyed or irritable 0  Afraid - awful might happen 0  Total GAD 7 Score 0        Fall Risk  08/01/2020  Falls in the past year? 0  Number falls in past yr: 0  Injury with Fall? 0  Follow up Falls evaluation completed   Immunization History  Administered Date(s) Administered   DTaP 12/12/1994, 02/10/1995, 04/17/1995, 01/16/1996, 05/02/2000   Hepatitis B 08/02/94, 12/12/1994, 04/17/1995   HiB (PRP-OMP) 12/12/1994, 02/10/1995, 04/17/1995, 01/16/1996   IPV 12/12/1994, 02/10/1995, 04/17/1995, 05/02/2000   Influenza Split 09/04/2012   Influenza,inj,Quad PF,6+ Mos 11/27/2018, 08/29/2019, 08/29/2019   Influenza-Unspecified 11/01/2013, 10/06/2020   MMR 01/16/1996, 05/02/2000   Tdap 11/11/2011    No results found.  Past Medical History:  Diagnosis Date  Anxiety and depression 11/11/2011   Chicken pox as a child   Contact dermatitis 06/04/2012   TO NICKLE   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   Migraine 04/16/2016   Nickel allergy    Obesity 11/11/2011   PT HAS LOST AT LEAST 100 LBS OVER PAST 9 TO 10 MONTHS-NO LONGER OBESE   Overactive bladder    NO LONGER A PROBLEM   PONV (postoperative nausea and vomiting)    NAUSEA AFTER ONE SURGERY AS A CHILD   Social anxiety disorder 11/11/2011   Vesico-ureteral reflux    BLADDER REFLUX AS A CHILD - NO PROBLEM NOW   Allergies  Allergen Reactions    Cefprozil Other (See Comments)    RASH HEAD TO TOE   Gardasil 9 [Hpv 9-Valent Recomb Vaccine]     Reaction to 2nd injection. Hives. Throat symptoms.    Nickel     Rash with contact   Sulfa Antibiotics     NAUSEA, GI UPSET   Past Surgical History:  Procedure Laterality Date   broken arm  2002   left, repair of ulna and radius   CHOLECYSTECTOMY N/A 08/09/2014   Procedure: LAPAROSCOPIC CHOLECYSTECTOMY WITH INTRAOPERATIVE CHOLANGIOGRAM;  Surgeon: Gayland Curry, MD;  Location: [facility];  Service: General;  Laterality: N/A;   Tympanostomy with myringotomy Bilateral 1996   WISDOM TOOTH EXTRACTION  10/23/2012   Family History  Problem Relation Age of Onset   Stroke Father    Heart disease Father    Depression Father    Diabetes Father        type 2   Other Father        muscular spasm of the heart   Hyperlipidemia Father    Heart attack Father    Hyperlipidemia Maternal Grandmother    Hypertension Maternal Grandmother    Diabetes Maternal Grandmother        type 2   Skin cancer Maternal Grandmother    Cancer Maternal Grandfather        laryngeal, alcohol and tobacco   Other Paternal Grandmother        arrythmia   Diabetes Paternal Grandmother        type 2   Pancreatic cancer Paternal Grandmother 24   Diabetes Paternal Grandfather        type 2   Hyperlipidemia Paternal Grandfather    Other Paternal Grandfather        fluid around the heart   Rheum arthritis Paternal Grandfather    Alzheimer's disease Paternal Grandfather    Social History   Social History Narrative   Marital status/children/pets: Single, G0P0   Education/employment: Secretary/administrator, 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 Hives. Throat symptoms.    Nickel    Rash with contact   Sulfa Antibiotics    NAUSEA, GI UPSET        Medication List  Accurate as of January 30, 2022 11:59 PM. If you have any questions, ask your nurse or doctor.          STOP taking these medications    methocarbamol 500 MG tablet Commonly known as: ROBAXIN Stopped by: Howard Pouch, DO   norethindrone-ethinyl estradiol 1-20 MG-MCG tablet Commonly known as: LOESTRIN Stopped by: Howard Pouch, DO       TAKE these medications    albuterol 108 (90 Base) MCG/ACT inhaler Commonly known as: VENTOLIN HFA Inhale 2 puffs into the lungs every 6 (six) hours as needed for wheezing or shortness of breath.   Cyanocobalamin 1000 MCG Subl 1 tab placed under the tongue daily.   doxycycline 100 MG tablet Commonly known as: VIBRA-TABS Take 1 tablet (100 mg total) by mouth 2 (two) times daily. Started by: Howard Pouch, DO   fluocinolone 0.01 % cream Commonly known as: VANOS Apply topically 2 (two) times daily.   Magnesium 250 MG Tabs   montelukast 10 MG tablet Commonly known as: SINGULAIR Take 1 tablet (10 mg total) by mouth at bedtime.   topiramate 25 MG tablet Commonly known as: Topamax Take 1 tablet (25 mg total) by mouth daily. Started by: Howard Pouch, DO   Vitamin D (Cholecalciferol) 25 MCG (1000 UT) Caps        All past medical history, surgical history, allergies, family history, immunizations andmedications were updated in the EMR today and reviewed under the history and medication portions of their EMR.     DG Chest 2 View Result Date: 06/27/2021 IMPRESSION: No active cardiopulmonary disease. Electronically Signed   By: Marcello Moores  Register   On: 06/27/2021 05:54    ROS: 14 pt review of systems performed and negative (unless mentioned in an HPI)  Objective: BP 108/72    Pulse 92    Temp 98.4 F (36.9 C) (Oral)    Ht _0  (1.626 m)    Wt 231 lb (104.8  kg)    LMP 01/22/2022 Comment: Shielded   SpO2 100%    BMI 39.65 kg/m  Physical Exam Vitals and nursing note reviewed.  Constitutional:      General: She is not in acute distress.    Appearance: Normal appearance. She is normal weight. She is not ill-appearing, toxic-appearing or diaphoretic.  HENT:     Head: Normocephalic and atraumatic.     Right Ear: Tympanic membrane, ear canal and external ear normal.     Left Ear: Tympanic membrane, ear canal and external ear normal.     Nose: Congestion and rhinorrhea present.     Mouth/Throat:     Mouth: Mucous membranes are moist.     Pharynx: No oropharyngeal exudate or posterior oropharyngeal erythema.  Eyes:     General: No scleral icterus.       Right eye: No discharge.        Left eye: No discharge.     Extraocular Movements: Extraocular movements intact.     Right eye: Normal extraocular motion.     Left eye: Normal extraocular motion.     Conjunctiva/sclera: Conjunctivae normal.     Pupils: Pupils are equal, round, and reactive to light.     Comments: No papilledema bilaterally  Cardiovascular:     Rate and Rhythm: Normal rate and regular rhythm.     Heart sounds: Normal heart sounds. No murmur heard.   No friction rub. No gallop.  Pulmonary:     Effort: Pulmonary effort is normal. No respiratory distress.  Breath sounds: Normal breath sounds. No wheezing, rhonchi or rales.  Musculoskeletal:     Cervical back: No rigidity or tenderness.     Right lower leg: No edema.     Left lower leg: No edema.  Lymphadenopathy:     Cervical: No cervical adenopathy.  Skin:    General: Skin is warm.     Findings: No rash.  Neurological:     Mental Status: She is alert and oriented to person, place, and time. Mental status is at baseline.  Psychiatric:        Mood and Affect: Mood normal.        Behavior: Behavior normal.        Thought Content: Thought content normal.        Judgment: Judgment normal.      Assessment/plan: Robin Suarez is a 26 y.o. female present for toc/follow up Migraine without aura and with status migrainosus, not intractable/H/O multiple concussions/possible pseudotumor cerebri/intracranial hypertension. Encourage patient to continue B12 and add a low-dose magnesium supplement before bed.  Both can help with migraine prevention. -Established with neurology at upcoming appointment. -Discussed prophylactic migraine medication.  She elected to start low-dose Topamax and will follow-up with neuro for tapering versus changing medication if felt appropriate. -Encouraged low-sodium diet -Encouraged her to discontinue new PCP, at least for now. -Encouraged her to have formal eye exam with optometrist/ophthalmologist. -Low yield/however since she did have a rash we will check for Lyme titers and treat sinuses with Doxy twice daily.  Return in about 4 weeks (around 02/27/2022), or if symptoms worsen or fail to improve.  Orders Placed This Encounter  Procedures   B. burgdorfi antibodies    Meds ordered this encounter  Medications   doxycycline (VIBRA-TABS) 100 MG tablet    Sig: Take 1 tablet (100 mg total) by mouth 2 (two) times daily.    Dispense:  20 tablet    Refill:  0   topiramate (TOPAMAX) 25 MG tablet    Sig: Take 1 tablet (25 mg total) by mouth daily.    Dispense:  30 tablet    Refill:  1   Referral Orders  No referral(s) requested today     Note is dictated utilizing voice recognition software. Although note has been proof read prior to signing, occasional typographical errors still can be missed. If any questions arise, please do not hesitate to call for verification.  Electronically signed by: Howard Pouch, DO [place]

## 2022-01-31 LAB — B. BURGDORFI ANTIBODIES: B burgdorferi Ab IgG+IgM: <0.9 index

## 2022-01-31 NOTE — Progress Notes (Signed)
Referring-Robin Kuneff DO Reason for referral-dizziness and palpitations  HPI: 26 year old female for evaluation of dizziness and palpitations at request of Howard Pouch DO.  Laboratories January 22, 2022 showed negative pregnancy test, hemoglobin 15.1, potassium 3.8 and creatinine 0.89.  Patient states that since July 2022 she has had occasional palpitations.  She feels her heart rate dropped when she is working with her horses only.  There is a transient skip.  She has had mild dizziness but has not had syncope.  She also feels some dyspnea with these episodes.  She recently had some chest pain but this has resolved.  She otherwise is very active and is able to ride a spin bike for 1 hour without having chest pain or dyspnea.  There is no orthopnea, PND, pedal edema or exertional chest pain.  Cardiology now asked to evaluate.  Current Outpatient Medications  Medication Sig Dispense Refill   albuterol (VENTOLIN HFA) 108 (90 Base) MCG/ACT inhaler Inhale 2 puffs into the lungs every 6 (six) hours as needed for wheezing or shortness of breath. 8 g 1   Cyanocobalamin 1000 MCG SUBL 1 tab placed under the tongue daily. 90 tablet 3   fluocinolone (VANOS) 0.01 % cream Apply topically 2 (two) times daily. 30 g 0   Magnesium 250 MG TABS      montelukast (SINGULAIR) 10 MG tablet Take 1 tablet (10 mg total) by mouth at bedtime. 30 tablet 5   ondansetron (ZOFRAN) 4 MG tablet Take 1 tablet (4 mg total) by mouth every 8 (eight) hours as needed for nausea or vomiting. 20 tablet 5   SUMAtriptan (IMITREX) 100 MG tablet Take 1 tablet earliest onset of migraine.  May repeat in 2 hours if headache persists or recurs.  Maximum 2 tablets in 24 hours. 10 tablet 5   topiramate (TOPAMAX) 25 MG tablet Take 1 tablet (25 mg total) by mouth daily. 30 tablet 1   Vitamin D, Cholecalciferol, 25 MCG (1000 UT) CAPS      No current facility-administered medications for this visit.    Allergies  Allergen Reactions    Cefprozil Other (See Comments)    RASH HEAD TO TOE   Gardasil 9 [Hpv 9-Valent Recomb Vaccine]     Reaction to 2nd injection. Hives. Throat symptoms.    Nickel     Rash with contact   Sulfa Antibiotics     NAUSEA, GI UPSET     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   Migraine 04/16/2016   Nickel allergy    Obesity 11/11/2011   PT HAS LOST AT LEAST 100 LBS OVER PAST 9 TO 10 MONTHS-NO LONGER OBESE   Overactive bladder    NO LONGER A PROBLEM   PONV (postoperative nausea and vomiting)    NAUSEA AFTER ONE SURGERY AS A CHILD   Social anxiety disorder 11/11/2011   Vesico-ureteral reflux    BLADDER REFLUX AS A CHILD - NO PROBLEM NOW    Past Surgical History:  Procedure Laterality Date   broken arm  2002   left, repair of ulna and radius   CHOLECYSTECTOMY N/A 08/09/2014   Procedure: LAPAROSCOPIC CHOLECYSTECTOMY WITH INTRAOPERATIVE CHOLANGIOGRAM;  Surgeon: Gayland Curry, MD;  Location: [facility];  Service: General;  Laterality: N/A;  Tympanostomy with myringotomy Bilateral [place] EXTRACTION  10/23/2012    Social History   Socioeconomic History   Marital status: Married    Spouse name: Not on file   Number of children: Not on file   Years of education: Not on file   Highest education level: Associate degree: academic program  Occupational History   Not on file  Tobacco Use   Smoking status: Never    Passive exposure: Never   Smokeless tobacco: Never  Vaping Use   Vaping Use: Never used  Substance and Sexual Activity   Alcohol use: Yes    Comment: occ   Drug use: No   Sexual activity: Yes    Birth control/protection: Pill  Other Topics Concern   Not on file  Social History Narrative   Marital status/children/pets: Single, G0P0    Education/employment: Secretary/administrator, 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:      -Wears a bicycle helmet riding a bike: Yes     -smoke alarm in the home:Yes     - wears seatbelt: Yes     - Feels safe in their relationships: Yes      Social Determinants of Radio broadcast assistant Strain: Low Risk    Difficulty of Paying Living Expenses: Not hard at all  Food Insecurity: No Food Insecurity   Worried About Charity fundraiser in the Last Year: Never true   Arboriculturist in the Last Year: Never true  Transportation Needs: No Transportation Needs   Lack of Transportation (Medical): No   Lack of Transportation (Non-Medical): No  Physical Activity: Sufficiently Active   Days of Exercise per Week: 7 days   Minutes of Exercise per Session: 60 min  Stress: No Stress Concern Present   Feeling of Stress : Not at all  Social Connections: Moderately Isolated   Frequency of Communication with Friends and Family: More than three times a week   Frequency of Social Gatherings with Friends and Family: More than three times a week   Attends Religious Services: More than 4 times per year   Active Member of [employer] or Organizations: No   Attends Music therapist: Not on file   Marital Status: Never married  Human resources officer Violence: Not on file    Family History  Problem Relation Age of Onset   Stroke Father    Heart disease Father    Depression Father    Diabetes Father        type 2   Other Father        muscular spasm of the heart   Hyperlipidemia Father    Heart attack Father    Hyperlipidemia Maternal Grandmother    Hypertension Maternal Grandmother    Diabetes Maternal Grandmother        type 2   Skin cancer Maternal Grandmother    Cancer Maternal Grandfather        laryngeal, alcohol and tobacco   Other Paternal Grandmother        arrythmia   Diabetes Paternal Grandmother        type 2   Pancreatic cancer Paternal Grandmother 89   Diabetes Paternal  Grandfather        type 2   Hyperlipidemia Paternal Grandfather    Other Paternal Grandfather        fluid around the heart   Rheum arthritis Paternal Grandfather    Alzheimer's disease Paternal Grandfather  ROS: no fevers or chills, productive cough, hemoptysis, dysphasia, odynophagia, melena, hematochezia, dysuria, hematuria, rash, seizure activity, orthopnea, PND, pedal edema, claudication. Remaining systems are negative.  Physical Exam:   Blood pressure (!) 100/50, pulse 80, height 5\' 4"  (1.626 m), weight 230 lb 1.3 oz (104.4 kg), last menstrual period 01/22/2022.  General:  Well developed/well nourished in NAD Skin warm/dry Patient not depressed No peripheral clubbing Back-normal HEENT-normal/normal eyelids Neck supple/normal carotid upstroke bilaterally; no bruits; no JVD; no thyromegaly chest - CTA/ normal expansion CV - RRR/normal S1 and S2; no murmurs, rubs or gallops;  PMI nondisplaced Abdomen -NT/ND, no HSM, no mass, + bowel sounds, no bruit 2+ femoral pulses, no bruits Ext-no edema, chords, 2+ DP Neuro-grossly nonfocal  ECG -normal sinus rhythm at a rate of 80, no ST changes.  Personally reviewed  A/P  1 Palpitations/dizziness-etiology unclear.  We will arrange a 3-day Zio patch to further assess.  We will also schedule echocardiogram to assess LV function.  2 dyspnea-she notes some dyspnea with activities predominantly when she is working with her horses.  We will arrange an echocardiogram to assess LV function.  3 chest pain-she noted some atypical chest pain initially but has not had recent symptoms.  She is able to ride a spin bike for 1 hour with having no chest pain and electrocardiogram is normal.  We will not pursue further ischemia evaluation.  Kirk Ruths, MD

## 2022-02-04 ENCOUNTER — Encounter: Payer: Self-pay | Admitting: Family Medicine

## 2022-02-04 NOTE — Progress Notes (Signed)
NEUROLOGY CONSULTATION NOTE  Robin Suarez MRN: [id number] DOB: 1996-06-14  Referring provider: Howard Pouch, DO Primary care provider: Howard Pouch, DO  Reason for consult:  migraines  Assessment/Plan:   Migraine without aura, without status migrainosus, not intractable Migraine with aura  Migraine prevention:  continue topiramate 25mg  at bedtime.  Side effects discussed.  If no improvement in 4 weeks, we can increase dose to 50mg  at bedtime Migraine rescue:  sumatriptan 100mg  and Zofran 4mg  Limit use of pain relievers to no more than 2 days out of week to prevent risk of rebound or medication-overuse headache. Keep headache diary Follow up 4 months.     Subjective:  Robin Suarez is a 26 year old female who presents for migraines.  History supplemented by ED and referring provider's notes.    History of migraines since 26 years old.  Location:  left or right side.  Quality:  pressure, throbbing, sometimes pounding.  Intensity:  6/10.  Aura:  squiggly dark or bright line expanding into kaledoscope with visual field cut for 10-15 minutes (not always followed by headache).  Associated symptoms:  nausea, photophobia, phonophobia.  No longer with vomiting.  She denies associated unilateral numbness or weakness. Duration:  at least an hour up to 2-3 hours Frequency:  initially once a week, previously daily.  Triggers:  stress, caffeine  Relieving factors:  dark room, rest/sleep, sometimes drinking cold water  Activity:  aggravates  In July 2022, she developed a new type of headache.  Intermittent non-throbbing headache at the vertex with some photophobia but no phonophobia, nausea, vomiting, visual disturbance, numbness or weakness.  Also with posterior neck pain.  Usually lasts 1 day but may last up to 3 days.  They were occurring daily.  Treating with Tylenol or Advil.  Sometimes had a high-pitched tone but no pulsatile tinnitus.  Denied visual obscurations or blurred vision.   No fever.  She was seen in the ED on 01/21/2022 where CTV of head on 01/22/2022 personally reviewed showed narrowing of the distal transverse venous sinuses at the transverse-sigmoid junction with partial empty sella.  She discontinued her birth control (LOESTRIN).  Her PCP noted sinusitis and started her on doxycycline and topiramate.  Since starting topiramate, they have been occurring once a week and neck pain is mild.  She saw the eye doctor who found no evidence of papilledema.    Current NSAIDS/analgesics:  Tylenol, Advil Current triptans:  none Current ergotamine:  none Current anti-emetic:  none Current muscle relaxants:  none Current Antihypertensive medications:  none Current Antidepressant medications:  none Current Anticonvulsant medications:  topiramate 25mg  at bedtime Current anti-CGRP:  none Current Vitamins/Herbal/Supplements:  none Current Antihistamines/Decongestants:  none Other therapy:  none Hormone/birth control:  none   Past NSAIDS/analgesics:  none Past abortive triptans:  none Past abortive ergotamine:  none Past muscle relaxants:  methocarbamol Past anti-emetic:  promethazine, ondansetron Past antihypertensive medications:  none Past antidepressant medications:  citalopram Past anticonvulsant medications:  none Past anti-CGRP:  none Past vitamins/Herbal/Supplements:  none Past antihistamines/decongestants:  Flonase, meclizine Other past therapies:  none  Caffeine:  no Diet:  increased water intake (at least a gallon).  Skips meals (breakfast).  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   Migraine 04/16/2016   Nickel allergy    Obesity 11/11/2011   PT HAS LOST AT LEAST 100 LBS OVER PAST 9 TO 10 MONTHS-NO LONGER OBESE   Overactive bladder    NO LONGER A PROBLEM   PONV (postoperative nausea and vomiting)    NAUSEA AFTER ONE SURGERY AS A CHILD   Social anxiety disorder 11/11/2011   Vesico-ureteral reflux    BLADDER REFLUX AS A CHILD - NO PROBLEM NOW    PAST SURGICAL HISTORY: Past Surgical History:  Procedure Laterality Date   broken arm  2002   left, repair of ulna and radius   CHOLECYSTECTOMY N/A 08/09/2014   Procedure: LAPAROSCOPIC CHOLECYSTECTOMY WITH INTRAOPERATIVE CHOLANGIOGRAM;  Surgeon: Gayland Curry, MD;  Location: [facility];  Service: General;  Laterality: N/A;   Tympanostomy with myringotomy Bilateral 1996   WISDOM TOOTH EXTRACTION  10/23/2012    MEDICATIONS: Current Outpatient Medications on File Prior to Visit  Medication Sig Dispense Refill   albuterol (VENTOLIN HFA) 108 (90 Base) MCG/ACT inhaler Inhale 2 puffs into the lungs every 6 (six) hours as needed for wheezing or shortness of breath. 8 g 1   Cyanocobalamin 1000 MCG SUBL 1 tab placed under the tongue daily. 90 tablet 3   doxycycline (VIBRA-TABS) 100 MG tablet Take 1 tablet (100 mg total) by mouth 2 (two) times daily. 20 tablet 0   fluocinolone (VANOS) 0.01 % cream Apply topically 2 (two) times daily. 30 g 0   Magnesium 250 MG TABS      montelukast (SINGULAIR) 10 MG tablet Take 1 tablet (10 mg total) by mouth at bedtime. 30 tablet 5   topiramate (TOPAMAX) 25 MG tablet Take 1 tablet (25 mg total) by mouth daily. 30 tablet 1   Vitamin D, Cholecalciferol, 25 MCG (1000 UT) CAPS      No current facility-administered medications on file prior to visit.    ALLERGIES: Allergies  Allergen Reactions   Cefprozil Other (See Comments)    RASH HEAD TO TOE   Gardasil 9 [Hpv 9-Valent Recomb Vaccine]     Reaction to 2nd injection. Hives. Throat symptoms.    Nickel     Rash with contact    Sulfa Antibiotics     NAUSEA, GI UPSET    FAMILY HISTORY: Family History  Problem Relation Age of Onset   Stroke Father    Heart disease Father    Depression Father    Diabetes Father        type 2   Other Father        muscular spasm of the heart   Hyperlipidemia Father    Heart attack Father    Hyperlipidemia Maternal Grandmother    Hypertension Maternal Grandmother    Diabetes Maternal Grandmother        type 2   Skin cancer Maternal Grandmother    Cancer Maternal Grandfather        laryngeal, alcohol and tobacco   Other Paternal Grandmother        arrythmia   Diabetes Paternal Grandmother        type 2   Pancreatic cancer Paternal Grandmother 62   Diabetes Paternal Grandfather        type 2   Hyperlipidemia Paternal Grandfather    Other Paternal Grandfather  fluid around the heart   Rheum arthritis Paternal Grandfather    Alzheimer's disease Paternal Grandfather     Objective:  Blood pressure 129/85, pulse 86, height 5\' 4"  (1.626 m), weight 229 lb 6.4 oz (104.1 kg), last menstrual period 01/22/2022, SpO2 100 %. General: No acute distress.  Patient appears well-groomed.   Head:  Normocephalic/atraumatic Eyes:  fundi examined but not visualized Neck: supple, no paraspinal tenderness, full range of motion Back: No paraspinal tenderness Heart: regular rate and rhythm Lungs: Clear to auscultation bilaterally. Vascular: No carotid bruits. Neurological Exam: Mental status: alert and oriented to person, place, and time, recent and remote memory intact, fund of knowledge intact, attention and concentration intact, speech fluent and not dysarthric, language intact. Cranial nerves: CN I: not tested CN II: pupils equal, round and reactive to light, visual fields intact CN III, IV, VI:  full range of motion, no nystagmus, no ptosis CN V: facial sensation intact. CN VII: upper and lower face symmetric CN VIII: hearing intact CN IX, X: gag intact, uvula  midline CN XI: sternocleidomastoid and trapezius muscles intact CN XII: tongue midline Bulk & Tone: normal, no fasciculations. Motor:  muscle strength 5/5 throughout Sensation:  Pinprick, temperature and vibratory sensation intact. Deep Tendon Reflexes:  2+ throughout,  toes downgoing.   Finger to nose testing:  Without dysmetria.   Heel to shin:  Without dysmetria.   Gait:  Normal station and stride.  Romberg negative.    Thank you for allowing me to take part in the care of this patient.  Metta Clines, DO  CC: Howard Pouch, DO

## 2022-02-05 ENCOUNTER — Other Ambulatory Visit: Payer: Self-pay

## 2022-02-05 ENCOUNTER — Ambulatory Visit (INDEPENDENT_AMBULATORY_CARE_PROVIDER_SITE_OTHER): Payer: Self-pay | Admitting: Neurology

## 2022-02-05 ENCOUNTER — Encounter: Payer: Self-pay | Admitting: Neurology

## 2022-02-05 VITALS — BP 129/85 | HR 86 | Ht 64.0 in | Wt 229.4 lb

## 2022-02-05 DIAGNOSIS — G43009 Migraine without aura, not intractable, without status migrainosus: Secondary | ICD-10-CM

## 2022-02-05 DIAGNOSIS — G43109 Migraine with aura, not intractable, without status migrainosus: Secondary | ICD-10-CM

## 2022-02-05 MED ORDER — ONDANSETRON HCL 4 MG PO TABS: 4.0000 mg | ORAL_TABLET | Freq: Three times a day (TID) | ORAL | 5 refills | Status: DC | PRN

## 2022-02-05 MED ORDER — SUMATRIPTAN SUCCINATE 100 MG PO TABS: ORAL_TABLET | ORAL | 5 refills | Status: DC

## 2022-02-05 NOTE — Patient Instructions (Signed)
°  Continue topiramate 25mg  at bedtime.  Contact in 4 weeks with update and we can increase dose if needed. Take sumatriptan 100mg  at earliest onset of headache.  May repeat dose once in 2 hours if needed.  Maximum 2 tablets in 24 hours. Take ondansetron 4mg  for nausea Limit use of pain relievers to no more than 2 days out of the week.  These medications include acetaminophen, NSAIDs (ibuprofen/Advil/Motrin, naproxen/Aleve, triptans (Imitrex/sumatriptan), Excedrin, and narcotics.  This will help reduce risk of rebound headaches. Be aware of common food triggers:  - Caffeine:  coffee, black tea, cola, Mt. Dew  - Chocolate  - Dairy:  aged cheeses (brie, blue, cheddar, gouda, Carbon, provolone, [place], Swiss, etc), chocolate milk, buttermilk, sour cream, limit eggs and yogurt  - Nuts, peanut butter  - Alcohol  - Cereals/grains:  FRESH breads (fresh bagels, sourdough, doughnuts), yeast productions  - Processed/canned/aged/cured meats (pre-packaged deli meats, hotdogs)  - MSG/glutamate:  soy sauce, flavor enhancer, pickled/preserved/marinated foods  - Sweeteners:  aspartame (Equal, Nutrasweet).  Sugar and Splenda are okay  - Vegetables:  legumes (lima beans, lentils, snow peas, fava beans, pinto peans, peas, garbanzo beans), sauerkraut, onions, olives, pickles  - Fruit:  avocados, bananas, citrus fruit (orange, lemon, grapefruit), mango  - Other:  Frozen meals, macaroni and cheese Routine exercise Stay adequately hydrated (aim for 64 oz water daily) Keep headache diary Maintain proper stress management Maintain proper sleep hygiene Do not skip meals Consider supplements:  magnesium citrate 400mg  daily, riboflavin 400mg  daily, coenzyme Q10 100mg  three times daily.

## 2022-02-13 ENCOUNTER — Other Ambulatory Visit: Payer: Self-pay

## 2022-02-13 ENCOUNTER — Encounter: Payer: Self-pay | Admitting: Cardiology

## 2022-02-13 ENCOUNTER — Ambulatory Visit (INDEPENDENT_AMBULATORY_CARE_PROVIDER_SITE_OTHER): Payer: Self-pay | Admitting: Cardiology

## 2022-02-13 VITALS — BP 100/50 | HR 80 | Ht 64.0 in | Wt 230.1 lb

## 2022-02-13 DIAGNOSIS — R002 Palpitations: Secondary | ICD-10-CM

## 2022-02-13 DIAGNOSIS — R072 Precordial pain: Secondary | ICD-10-CM

## 2022-02-13 DIAGNOSIS — R0609 Other forms of dyspnea: Secondary | ICD-10-CM

## 2022-02-13 NOTE — Patient Instructions (Signed)
Testing/Procedures:  Your physician has requested that you have an echocardiogram. Echocardiography is a painless test that uses sound waves to create images of your heart. It provides your doctor with information about the size and shape of your heart and how well your hearts chambers and valves are working. This procedure takes approximately one hour. There are no restrictions for this procedure. [facility]-1ST FLOOR IMAGING  ZIO XT- Long Term Monitor Instructions  Your physician has requested you wear a ZIO patch monitor for 3 days.  This is a single patch monitor. [employer] supplies one patch monitor per enrollment. Additional stickers are not available. Please do not apply patch if you will be having a Nuclear Stress Test,  Echocardiogram, Cardiac CT, MRI, or Chest Xray during the period you would be wearing the  monitor. The patch cannot be worn during these tests. You cannot remove and re-apply the  ZIO XT patch monitor.  Your ZIO patch monitor will be mailed 3 day USPS to your address on file. It may take 3-5 days  to receive your monitor after you have been enrolled.  Once you have received your monitor, please review the enclosed instructions. Your monitor  has already been registered assigning a specific monitor serial # to you.  Billing and Patient Assistance Program Information  We have supplied [employer] with any of your insurance information on file for billing purposes. [employer] offers a sliding scale Patient Assistance Program for patients that do not have  insurance, or whose insurance does not completely cover the cost of the ZIO monitor.  You must apply for the Patient Assistance Program to qualify for this discounted rate.  To apply, please call [employer] at [phone], select option 4, select option 2, ask to apply for  Patient Assistance Program. Meredeth Ide will ask your household income, and how many people  are in your household. They will quote your out-of-pocket  cost based on that information.  [employer] will also be able to set up a 26-month, interest-free payment plan if needed.  Applying the monitor   Shave hair from upper left chest.  Hold abrader disc by orange tab. Rub abrader in 40 strokes over the upper left chest as  indicated in your monitor instructions.  Clean area with 4 enclosed alcohol pads. Let dry.  Apply patch as indicated in monitor instructions. Patch will be placed under collarbone on left  side of chest with arrow pointing upward.  Rub patch adhesive wings for 2 minutes. Remove white label marked "1". Remove the white  label marked "2". Rub patch adhesive wings for 2 additional minutes.  While looking in a mirror, press and release button in center of patch. A small green light will  flash 3-4 times. This will be your only indicator that the monitor has been turned on.  Do not shower for the first 24 hours. You may shower after the first 24 hours.  Press the button if you feel a symptom. You will hear a small click. Record Date, Time and  Symptom in the Patient Logbook.  When you are ready to remove the patch, follow instructions on the last 2 pages of Patient  Logbook. Stick patch monitor onto the last page of Patient Logbook.  Place Patient Logbook in the blue and white box. Use locking tab on box and tape box closed  securely. The blue and white box has prepaid postage on it. Please place it in the mailbox as  soon as possible. Your physician should have  your test results approximately 7 days after the  monitor has been mailed back to [facility].  Call [facility] Customer Care at [phone] if you have questions regarding  your ZIO XT patch monitor. Call them immediately if you see an orange light blinking on your  monitor.  If your monitor falls off in less than 4 days, contact our Monitor department at [phone].  If your monitor becomes loose or falls off after 4 days call [employer] at [phone] for   suggestions on securing your monitor    Follow-Up: At [facility], you and your health needs are our priority.  As part of our continuing mission to provide you with exceptional heart care, we have created designated Provider Care Teams.  These Care Teams include your primary Cardiologist (physician) and Advanced Practice Providers (APPs -  Physician Assistants and Nurse Practitioners) who all work together to provide you with the care you need, when you need it.  We recommend signing up for the patient portal called "MyChart".  Sign up information is provided on this After Visit Summary.  MyChart is used to connect with patients for Virtual Visits (Telemedicine).  Patients are able to view lab/test results, encounter notes, upcoming appointments, etc.  Non-urgent messages can be sent to your provider as well.   To learn more about what you can do with MyChart, go to [email or web address].    Your next appointment:   6 month(s)  The format for your next appointment:   In Person  Provider:   Olga Millers, MD

## 2022-03-04 ENCOUNTER — Ambulatory Visit: Payer: Self-pay | Admitting: Neurology

## 2022-03-26 ENCOUNTER — Encounter: Payer: Self-pay | Admitting: Neurology

## 2022-03-26 ENCOUNTER — Other Ambulatory Visit: Payer: Self-pay | Admitting: Neurology

## 2022-03-26 MED ORDER — TOPIRAMATE 50 MG PO TABS: 50.0000 mg | ORAL_TABLET | Freq: Every day | ORAL | 5 refills | Status: DC

## 2022-03-26 NOTE — Progress Notes (Unsigned)
TOPI ?

## 2022-05-21 ENCOUNTER — Encounter: Payer: Self-pay | Admitting: Cardiology

## 2022-05-21 ENCOUNTER — Telehealth: Payer: Self-pay | Admitting: *Deleted

## 2022-05-21 ENCOUNTER — Ambulatory Visit: Payer: Self-pay

## 2022-05-21 DIAGNOSIS — R0609 Other forms of dyspnea: Secondary | ICD-10-CM

## 2022-05-21 DIAGNOSIS — R072 Precordial pain: Secondary | ICD-10-CM

## 2022-05-21 DIAGNOSIS — R002 Palpitations: Secondary | ICD-10-CM

## 2022-05-21 NOTE — Telephone Encounter (Signed)
Called patient to update insurance information. Her insurance activation date will be 05/23/22.  Her monitor will be ordered at that time. Epic will not accept insurance information until it is an active policy.

## 2022-05-23 ENCOUNTER — Ambulatory Visit: Payer: 59

## 2022-05-23 NOTE — Progress Notes (Unsigned)
Enrolled for [employer] to mail a ZIO XT long term holter monitor to the patients address on file.  

## 2022-06-04 NOTE — Progress Notes (Addendum)
NEUROLOGY FOLLOW UP OFFICE NOTE  Robin Suarez [id number]  Assessment/Plan:   Migraine without aura, without status migrainosus, not intractable Migraine with aura Right sided occipital neuralgia Cervicalgia Right sided carpal tunnel syndrome vs cervical radiculopathy   Migraine prevention:  Topiramate 50mg  at bedtime.  Migraine rescue:  sumatriptan 100mg  and Zofran 4mg  Refer to Medicine for neck pain - possible OMM Limit use of pain relievers to no more than 2 days out of week to prevent risk of rebound or medication-overuse headache. Keep headache diary Follow up 6 months.         Subjective:  Robin Suarez is a 26 year old female who follows up for migraines.   UPDATE: Since last visit, topiramate was increased to 50mg  at bedtime. No migraines Still with neck pain in right paraspinal region, sometimes aching and sometimes burning radiating to back of head. If moves neck side to side, feels dizzy.    Current NSAIDS/analgesics:  Tylenol, Advil Current triptans:  sumatriptan 100mg  Current ergotamine:  none Current anti-emetic:  Zofran 4mg  Current muscle relaxants:  none Current Antihypertensive medications:  none Current Antidepressant medications:  none Current Anticonvulsant medications:  topiramate 50mg  at bedtime Current anti-CGRP:  none Current Vitamins/Herbal/Supplements:  none Current Antihistamines/Decongestants:  none Other therapy:  none Hormone/birth control:  none  Caffeine:  no Diet:  increased water intake (at least a gallon).  Skips meals (breakfast).  Does not snack Exercise:  was doing spin class Depression:  no; Anxiety:  no Other pain:  no Sleep hygiene:  good  HISTORY:  History of migraines since 26 years old.  Location:  left or right side.  Quality:  pressure, throbbing, sometimes pounding.  Intensity:  6/10.  Aura:  squiggly dark or bright line expanding into kaledoscope with visual field cut for 10-15 minutes (not  always followed by headache).  Associated symptoms:  nausea, photophobia, phonophobia.  No longer with vomiting.  She denies associated unilateral numbness or weakness. Duration:  at least an hour up to 2-3 hours Frequency:  initially once a week, previously daily.  Triggers:  stress, caffeine  Relieving factors:  dark room, rest/sleep, sometimes drinking cold water  Activity:  aggravates   In July 2022, she developed a new type of headache.  Intermittent non-throbbing headache at the vertex with some photophobia but no phonophobia, nausea, vomiting, visual disturbance, numbness or weakness.  Also with posterior neck pain.  Usually lasts 1 day but may last up to 3 days.  They were occurring daily.  Treating with Tylenol or Advil.  Sometimes had a high-pitched tone but no pulsatile tinnitus.  Denied visual obscurations or blurred vision.  No fever.  She was seen in the ED on 01/21/2022 where CTV of head on 01/22/2022 personally reviewed showed narrowing of the distal transverse venous sinuses at the transverse-sigmoid junction with partial empty sella.  She discontinued her birth control (LOESTRIN).  Her PCP noted sinusitis and started her on doxycycline and topiramate.  Since starting topiramate, they have been occurring once a week and neck pain is mild.  She saw the eye doctor who found no evidence of papilledema.       Past NSAIDS/analgesics:  none Past abortive triptans:  none Past abortive ergotamine:  none Past muscle relaxants:  methocarbamol Past anti-emetic:  promethazine, ondansetron Past antihypertensive medications:  none Past antidepressant medications:  citalopram Past anticonvulsant medications:  none Past anti-CGRP:  none Past vitamins/Herbal/Supplements:  none Past antihistamines/decongestants:  Flonase, meclizine Other past therapies:  none    Family history of headache:  father  PAST MEDICAL HISTORY: Past Medical History:  Diagnosis Date   Anxiety and depression 11/11/2011    Chicken pox as a child   Contact dermatitis 06/04/2012   TO NICKLE   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   Migraine 04/16/2016   Nickel allergy    Obesity 11/11/2011   PT HAS LOST AT LEAST 100 LBS OVER PAST 9 TO 10 MONTHS-NO LONGER OBESE   Overactive bladder    NO LONGER A PROBLEM   PONV (postoperative nausea and vomiting)    NAUSEA AFTER ONE SURGERY AS A CHILD   Social anxiety disorder 11/11/2011   Vesico-ureteral reflux    BLADDER REFLUX AS A CHILD - NO PROBLEM NOW    MEDICATIONS: Current Outpatient Medications on File Prior to Visit  Medication Sig Dispense Refill   topiramate (TOPAMAX) 50 MG tablet Take 1 tablet (50 mg total) by mouth at bedtime. 30 tablet 5   albuterol (VENTOLIN HFA) 108 (90 Base) MCG/ACT inhaler Inhale 2 puffs into the lungs every 6 (six) hours as needed for wheezing or shortness of breath. 8 g 1   Cyanocobalamin 1000 MCG SUBL 1 tab placed under the tongue daily. 90 tablet 3   fluocinolone (VANOS) 0.01 % cream Apply topically 2 (two) times daily. 30 g 0   Magnesium 250 MG TABS      montelukast (SINGULAIR) 10 MG tablet Take 1 tablet (10 mg total) by mouth at bedtime. 30 tablet 5   ondansetron (ZOFRAN) 4 MG tablet Take 1 tablet (4 mg total) by mouth every 8 (eight) hours as needed for nausea or vomiting. 20 tablet 5   SUMAtriptan (IMITREX) 100 MG tablet Take 1 tablet earliest onset of migraine.  May repeat in 2 hours if headache persists or recurs.  Maximum 2 tablets in 24 hours. 10 tablet 5   Vitamin D, Cholecalciferol, 25 MCG (1000 UT) CAPS      No current facility-administered medications on file prior to visit.    ALLERGIES: Allergies  Allergen Reactions   Cefprozil Other (See Comments)    RASH HEAD TO TOE   Gardasil 9 [Hpv 9-Valent Recomb Vaccine]     Reaction to 2nd injection. Hives. Throat symptoms.    Nickel     Rash with contact   Sulfa Antibiotics     NAUSEA, GI UPSET     FAMILY HISTORY: Family History  Problem Relation Age of Onset   Stroke Father    Heart disease Father    Depression Father    Diabetes Father        type 2   Other Father        muscular spasm of the heart   Hyperlipidemia Father    Heart attack Father    Hyperlipidemia Maternal Grandmother    Hypertension Maternal Grandmother    Diabetes Maternal Grandmother        type 2   Skin cancer Maternal Grandmother    Cancer Maternal Grandfather        laryngeal, alcohol and tobacco   Other Paternal Grandmother        arrythmia   Diabetes Paternal Grandmother        type 2   Pancreatic cancer Paternal Grandmother [id number]   Diabetes Paternal Grandfather        type 2   Hyperlipidemia Paternal Grandfather    Other Paternal Grandfather  fluid around the heart   Rheum arthritis Paternal Grandfather    Alzheimer's disease Paternal Grandfather       Objective:  Blood pressure 124/84, pulse 75, resp. rate 20, weight 236 lb (107 kg), SpO2 98 %. General: No acute distress.  Patient appears well-groomed.   Head:  Normocephalic/atraumatic Eyes:  Fundi examined but not visualized Neck: supple, right paraspinal tenderness, full range of motion Heart:  Regular rate and rhythm Lungs:  Clear to auscultation bilaterally Back: No paraspinal tenderness Neurological Exam: alert and oriented to person, place, and time.  Speech fluent and not dysarthric, language intact.  CN II-XII intact. Bulk and tone normal, muscle strength 5/5 throughout.  Sensation to light touch intact.  Deep tendon reflexes 2+ throughout, toes downgoing.  Finger to nose testing intact.  Gait normal, Romberg negative.   Shon Millet, DO  CC: Felix Pacini, DO

## 2022-06-05 ENCOUNTER — Encounter: Payer: Self-pay | Admitting: Neurology

## 2022-06-05 ENCOUNTER — Ambulatory Visit (INDEPENDENT_AMBULATORY_CARE_PROVIDER_SITE_OTHER): Payer: 59 | Admitting: Neurology

## 2022-06-05 VITALS — BP 124/84 | HR 75 | Resp 20 | Wt 236.0 lb

## 2022-06-05 DIAGNOSIS — M5481 Occipital neuralgia: Secondary | ICD-10-CM | POA: Diagnosis not present

## 2022-06-05 DIAGNOSIS — M542 Cervicalgia: Secondary | ICD-10-CM

## 2022-06-05 DIAGNOSIS — G5601 Carpal tunnel syndrome, right upper limb: Secondary | ICD-10-CM

## 2022-06-05 DIAGNOSIS — G43009 Migraine without aura, not intractable, without status migrainosus: Secondary | ICD-10-CM

## 2022-06-05 DIAGNOSIS — G43109 Migraine with aura, not intractable, without status migrainosus: Secondary | ICD-10-CM | POA: Diagnosis not present

## 2022-06-05 NOTE — Patient Instructions (Signed)
Continue topiramate 50mg  at bedtime Continue sumatriptan and Zofran as needed Wear wrist splint on right wrist at night Refer to [facility] for neck pain/osteopathic manipulative medicine with Dr. or Dr. Antoine Primas Follow up with me in 6 months.

## 2022-06-07 ENCOUNTER — Ambulatory Visit (HOSPITAL_COMMUNITY): Payer: 59 | Attending: Cardiology

## 2022-06-07 DIAGNOSIS — R072 Precordial pain: Secondary | ICD-10-CM | POA: Insufficient documentation

## 2022-06-07 DIAGNOSIS — R0609 Other forms of dyspnea: Secondary | ICD-10-CM | POA: Diagnosis not present

## 2022-06-07 DIAGNOSIS — R002 Palpitations: Secondary | ICD-10-CM | POA: Insufficient documentation

## 2022-06-07 LAB — ECHOCARDIOGRAM COMPLETE
Area-P 1/2: 3.12 cm2
S' Lateral: 3.4 cm

## 2022-06-12 NOTE — Progress Notes (Signed)
Robin Suarez [facility] [address] [place] [address] Phone: [phone]   Assessment and Plan:     1. Migraine without aura and with status migrainosus, not intractable 2. Neck pain 3. Cervical radiculopathy 4.  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  Radicular symptoms not reproducible on physical exam in clinic today - Start HEP and physical therapy - Start meloxicam 15 mg daily x2 weeks.  If still having pain after 2 weeks, complete 3rd-week of meloxicam. May use remaining meloxicam as needed once daily for pain control.  Do not to use additional NSAIDs while taking meloxicam.  May use Tylenol [phone] mg 2 to 3 times a day for breakthrough pain. - Recommend altering physical activity to not put any pressure along trap or between right-sided neck and shoulder - X-ray obtained in clinic.  My interpretation: No acute fracture or vertebral collapse.  Loss of typical cervical lordosis   Pertinent previous records reviewed include CT C-spine 02/08/2017, CT head 02/08/2017, CT venogram 01/22/2022   Follow Up: 3 to 4 weeks for reevaluation.  Could consider starting OMT if symptoms have improved.  If no improvement or worsening of symptoms, could consider advanced imaging versus EMG   Subjective:   I, Robin Suarez, am serving as a Neurosurgeon for Doctor Richardean Sale  Chief Complaint: neck pain   HPI:  06/13/2022 Patient is a 26 year old female complaining of neck pain. Patient states that in January she has a hx of migraines that as neck pain that comes with it, does get numbness and tingling gon the right side that foes down to the fingers does also get a burning sensation on the back  of the head that goes down the arm, hx of neck injuries from horseback riding and weightlifting , side to side movement makes her nauseas and dizzy, robaxin didn't work for her also gave her panic attacks , also Advil and tylenol   Relevant Historical Information: Migraines  Additional pertinent review of systems negative.  Current Outpatient Medications  Medication Sig Dispense Refill   albuterol (VENTOLIN HFA) 108 (90 Base) MCG/ACT inhaler Inhale 2 puffs into the lungs every 6 (six) hours as needed for wheezing or shortness of breath. 8 g 1   Cyanocobalamin 1000 MCG SUBL 1 tab placed under the tongue daily. 90 tablet 3   fluocinolone (VANOS) 0.01 % cream Apply topically 2 (two) times daily. 30 g 0   Magnesium 250 MG TABS      meloxicam (MOBIC) 15 MG tablet Take 1 tablet (15 mg total) by mouth daily. 30 tablet 0   montelukast (SINGULAIR) 10 MG tablet Take 1 tablet (10 mg total) by mouth at bedtime. 30 tablet 5   ondansetron (ZOFRAN) 4 MG tablet Take 1 tablet (4 mg total) by mouth every 8 (eight) hours as needed for nausea or vomiting. 20 tablet 5   SUMAtriptan (IMITREX) 100 MG tablet Take 1 tablet earliest onset of migraine.  May repeat in 2 hours if headache persists or recurs.  Maximum 2 tablets in 24 hours. 10 tablet 5   topiramate (TOPAMAX) 50 MG tablet Take 1 tablet (50 mg total) by mouth at bedtime. 30 tablet 5   Vitamin D, Cholecalciferol, 25 MCG (1000 UT) CAPS  No current facility-administered medications for this visit.      Objective:     Vitals:   06/13/22 1243  BP: 118/80  Pulse: 68  SpO2: 100%  Weight: 234 lb (106.1 kg)  Height: 5\' 4"  (1.626 m)      Body mass index is 40.17 kg/m.    Physical Exam:     General: Well-appearing, cooperative, sitting comfortably in no acute distress.  Cervical Spine: Posture normal Skin: normal, intact  Neurological:   Strength:  Right  Left   Deltoid 5/5 5/5  Bicep 5/5  5/5  Tricep 5/5 5/5  Wrist Flexion 5/5 5/5  Wrist  Extension 5/5 5/5  Grip 5/5 5/5  Finger Abduction 5/5 5/5   Sensation: intact to light touch in upper extremities bilaterally  Spurling's:  negative bilaterally Neck ROM: Full active ROM TTP: Right cervical paraspinal, right trapezius NTTP: cervical spinous processes, left cervical paraspinal, thoracic paraspinal, left trapezius  Negative Adson's, Roo's  Electronically signed by:  [facility] 1:12 PM 06/13/22

## 2022-06-13 ENCOUNTER — Ambulatory Visit: Payer: 59 | Admitting: Sports Medicine

## 2022-06-13 ENCOUNTER — Ambulatory Visit (INDEPENDENT_AMBULATORY_CARE_PROVIDER_SITE_OTHER): Payer: 59

## 2022-06-13 VITALS — BP 118/80 | HR 68 | Ht 64.0 in | Wt 234.0 lb

## 2022-06-13 DIAGNOSIS — G540 Brachial plexus disorders: Secondary | ICD-10-CM | POA: Diagnosis not present

## 2022-06-13 DIAGNOSIS — M542 Cervicalgia: Secondary | ICD-10-CM | POA: Diagnosis not present

## 2022-06-13 DIAGNOSIS — G43001 Migraine without aura, not intractable, with status migrainosus: Secondary | ICD-10-CM

## 2022-06-13 DIAGNOSIS — M5412 Radiculopathy, cervical region: Secondary | ICD-10-CM | POA: Diagnosis not present

## 2022-06-13 MED ORDER — MELOXICAM 15 MG PO TABS: 15.0000 mg | ORAL_TABLET | Freq: Every day | ORAL | 0 refills | Status: DC

## 2022-06-13 NOTE — Patient Instructions (Addendum)
Good to se you  - Start meloxicam 15 mg daily x2 weeks.  If still having pain after 2 weeks, complete 3rd-week of meloxicam. May use remaining meloxicam as needed once daily for pain control.  Do not to use additional NSAIDs while taking meloxicam.  May use Tylenol [phone] mg 2 to 3 times a day for breakthrough pain. Trap and neck HEP  Referral to PT  3-4 week follow up

## 2022-06-17 NOTE — Therapy (Deleted)
OUTPATIENT PHYSICAL THERAPY CERVICAL EVALUATION   Patient Name: Robin Suarez MRN: [id number] DOB:Aug 18, 1996, 26 y.o., female Today's Date: 06/17/2022    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   Migraine 04/16/2016   Nickel allergy    Obesity 11/11/2011   PT HAS LOST AT LEAST 100 LBS OVER PAST 9 TO 10 MONTHS-NO LONGER OBESE   Overactive bladder    NO LONGER A PROBLEM   PONV (postoperative nausea and vomiting)    NAUSEA AFTER ONE SURGERY AS A CHILD   Social anxiety disorder 11/11/2011   Vesico-ureteral reflux    BLADDER REFLUX AS A CHILD - NO PROBLEM NOW   Past Surgical History:  Procedure Laterality Date   broken arm  2002   left, repair of ulna and radius   CHOLECYSTECTOMY N/A 08/09/2014   Procedure: LAPAROSCOPIC CHOLECYSTECTOMY WITH INTRAOPERATIVE CHOLANGIOGRAM;  Surgeon: Atilano Ina, MD;  Location: [facility];  Service: General;  Laterality: N/A;   Tympanostomy with myringotomy Bilateral 1996   WISDOM TOOTH EXTRACTION  10/23/2012   Patient Active Problem List   Diagnosis Date Noted   Vitamin B12 deficiency 07/23/2021   Reactive airway disease 07/23/2021   Seasonal allergies 07/23/2021   Orthostatic dizziness 06/26/2021   Rash 06/26/2021   Contraceptive management 02/23/2017   Migraine 04/16/2016   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   Obesity 11/11/2011   Anxiety and depression 11/11/2011   Nickel allergy    Overactive bladder    Vesico-ureteral reflux     PCP: Natalia Leatherwood, DO  REFERRING PROVIDER: Richardean Sale, DO  REFERRING DIAG: Migraine without aura and with status migrainosus, not intractable [G43.001], Neck pain [M54.2], Cervical radiculopathy [M54.12], Thoracic outlet syndrome [G54.0]  THERAPY DIAG:  No diagnosis  found.  Rationale for Evaluation and Treatment Rehabilitation  ONSET DATE: ***  SUBJECTIVE:                                                                                                                                                                                                         SUBJECTIVE STATEMENT: Patient is a 26 year old female complaining of neck pain. Patient states that in January she has a hx of migraines that as neck pain that comes with it, does get numbness and tingling gon the right side that foes down to the fingers does  also get a burning sensation on the back of the head that goes down the arm, hx of neck injuries from horseback riding and weightlifting , side to side movement makes her nauseas and dizzy, robaxin didn't work for her also gave her panic attacks , also Advil and tylenol   PERTINENT HISTORY:  Anxiety, depression, GERD, Migraine, obesity.   PAIN:  Are you having pain? Yes: NPRS scale: ***/10 Pain location: *** Pain description: *** Aggravating factors: *** Relieving factors: ***  PRECAUTIONS: None  WEIGHT BEARING RESTRICTIONS No  FALLS:  Has patient fallen in last 6 months? No  LIVING ENVIRONMENT: Lives with: {[facility] lives with:25569::"lives with their family"} Lives in: {Lives in:25570} Stairs: {opstairs:27293} Has following equipment at home: {Assistive devices:23999}  OCCUPATION: ***  PLOF: {PLOF:24004}  PATIENT GOALS ***  OBJECTIVE:   DIAGNOSTIC FINDINGS:  No acute fracture or vertebral collapse.  Loss of typical cervical lordosis  PATIENT SURVEYS:  FOTO ***   COGNITION: Overall cognitive status: {cognition:24006}   SENSATION: {sensation:27233}  POSTURE: {posture:25561}  PALPATION: ***   CERVICAL ROM:   {AROM/PROM:27142} ROM A/PROM (deg) eval  Flexion   Extension   Right lateral flexion   Left lateral flexion   Right rotation   Left rotation    (Blank rows = not tested)  UPPER EXTREMITY  ROM:  {AROM/PROM:27142} ROM Right eval Left eval  Shoulder flexion    Shoulder extension    Shoulder abduction    Shoulder adduction    Shoulder extension    Shoulder internal rotation    Shoulder external rotation    Elbow flexion    Elbow extension    Wrist flexion    Wrist extension    Wrist ulnar deviation    Wrist radial deviation    Wrist pronation    Wrist supination     (Blank rows = not tested)  UPPER EXTREMITY MMT:  MMT Right eval Left eval  Shoulder flexion    Shoulder extension    Shoulder abduction    Shoulder adduction    Shoulder extension    Shoulder internal rotation    Shoulder external rotation    Middle trapezius    Lower trapezius    Elbow flexion    Elbow extension    Wrist flexion    Wrist extension    Wrist ulnar deviation    Wrist radial deviation    Wrist pronation    Wrist supination    Grip strength     (Blank rows = not tested)  CERVICAL SPECIAL TESTS:  {Cervical special tests:25246}   FUNCTIONAL TESTS:  {Functional tests:24029}   TODAY'S TREATMENT:  SEE HEP below.    PATIENT EDUCATION:  Education details: Educated pt on anatomy and physiology of current symptoms, FOTO, diagnosis, prognosis, HEP,  and POC. Person educated: Patient Education method: Explanation, Demonstration, Tactile cues, Verbal cues, and Handouts Education comprehension: verbalized understanding and returned demonstration   HOME EXERCISE PROGRAM: ***  ASSESSMENT:  CLINICAL IMPRESSION: Patient is a 26 y.o. F who was seen today for physical therapy evaluation and treatment for Cervical pain, migraines and TOS.    OBJECTIVE IMPAIRMENTS {opptimpairments:25111}.   ACTIVITY LIMITATIONS {activitylimitations:27494}  PARTICIPATION LIMITATIONS: {participationrestrictions:25113}  PERSONAL FACTORS {Personal factors:25162} are also affecting patient's functional outcome.   REHAB POTENTIAL: {rehabpotential:25112}  CLINICAL DECISION MAKING: {clinical  decision making:25114}  EVALUATION COMPLEXITY: {Evaluation complexity:25115}   GOALS: Goals reviewed with patient? {yes/no:20286}  SHORT TERM GOALS: Target date: {follow up:25551}   Pt will be I and compliant with initial  HEP. Baseline:  Goal status: INITIAL  2.  Pt will report <10 pain at baseline in  Baseline: a Goal status: INITIAL  Baseline: *** Goal status: {GOALSTATUS:25110}  3.  *** Baseline: *** Goal status: {GOALSTATUS:25110}  4.  *** Baseline: *** Goal status: {GOALSTATUS:25110}  5.  *** Baseline: *** Goal status: {GOALSTATUS:25110}  6.  *** Baseline: *** Goal status: {GOALSTATUS:25110}  LONG TERM GOALS: Target date: {follow up:25551}  Pt will be independent with advanced HEP to continue to address postural limitations and muscle imbalances. Baseline: not provided Goal status: INITIAL  2.  Pt will report <10 pain at baseline in Baseline:  Goal status: INITIAL  3.  Pt will improve FOTO function score to no less than % as proxy for functional improvement Baseline: *** Goal status: {GOALSTATUS:25110}  4.  *** Baseline: *** Goal status: {GOALSTATUS:25110}  5.  *** Baseline: *** Goal status: {GOALSTATUS:25110}  6.  *** Baseline: *** Goal status: {GOALSTATUS:25110}   PLAN: PT FREQUENCY: {rehab frequency:25116}  PT DURATION: {rehab duration:25117}  PLANNED INTERVENTIONS: {rehab planned interventions:25118::"Therapeutic exercises","Therapeutic activity","Neuromuscular re-education","Balance training","Gait training","Patient/Family education","Joint mobilization"}  PLAN FOR NEXT SESSION: ***   Champ Mungo, PT 06/17/2022, 4:49 PM

## 2022-06-18 ENCOUNTER — Ambulatory Visit: Payer: 59 | Admitting: Physical Therapy

## 2022-06-21 IMAGING — CT CT VENOGRAM HEAD
1 of 10 series · 6 of 47 positions shown · non-contrast
Comparison: 02/08/2017.

CLINICAL DATA: Binocular vision loss

EXAM:
CT VENOGRAM HEAD
TECHNIQUE: Venographic phase images of the brain were obtained following the
administration of intravenous contrast. Multiplanar reformats and
maximum intensity projections were generated.

[Series 6: head venogram · axial · 0.45mm/px · z∈[+1353,+1477]mm · 6 of 88 slices shown]
[im 13/88  brain]
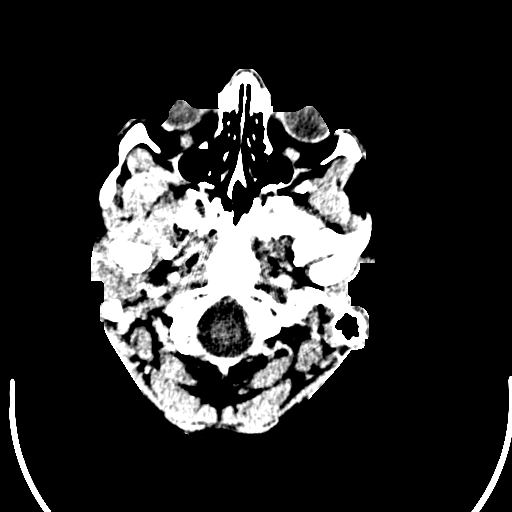
[im 25/88  bone]
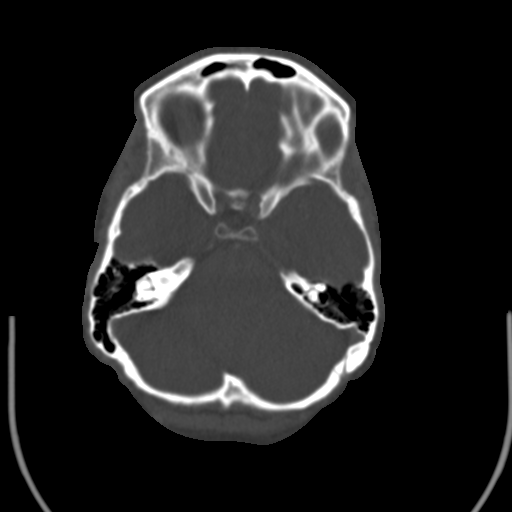
[im 38/88  brain]
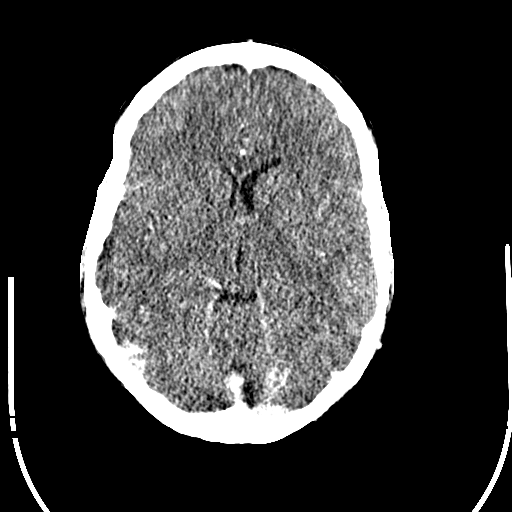
[im 50/88  bone]
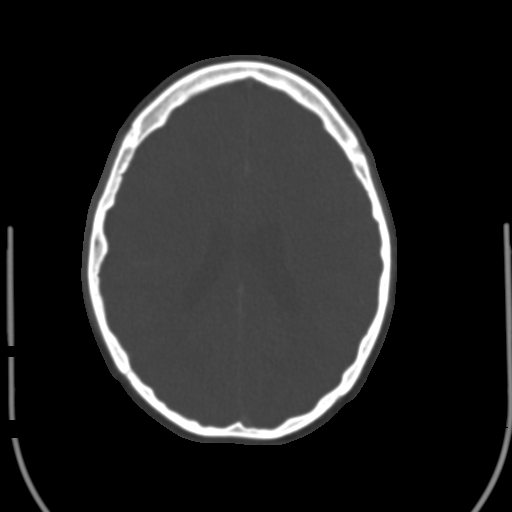
[im 63/88  brain]
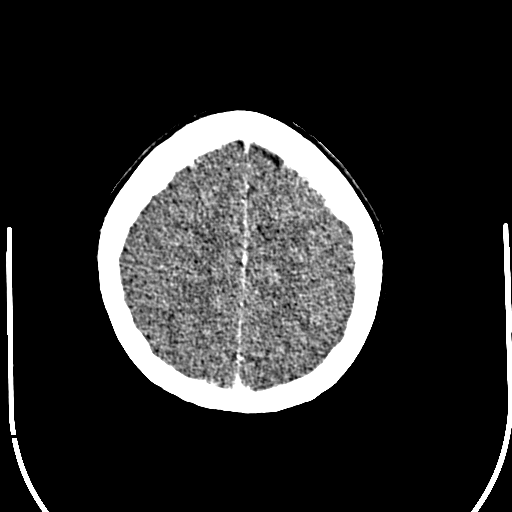
[im 75/88  bone]
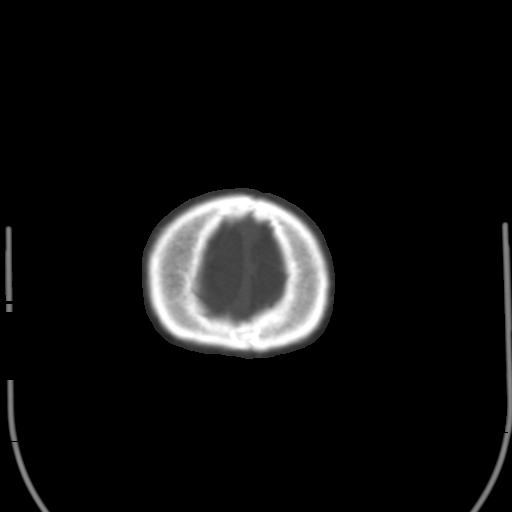

[6 of 47 positions shown; findings below may reference images not displayed]

RADIATION DOSE REDUCTION: This exam was performed according to the
departmental dose-optimization program which includes automated
exposure control, adjustment of the mA and/or kV according to
patient size and/or use of iterative reconstruction technique.

CONTRAST:  80mL OMNIPAQUE IOHEXOL 350 MG/ML SOLN
FINDINGS: No evidence of venous sinus thrombosis. Apparent narrowing of the
distal transverse sinuses at the transverse-sigmoid junction.

Brain: No evidence of acute infarction, hemorrhage, cerebral edema,
mass, mass effect, or midline shift. No hydrocephalus or extra-axial
fluid collection. Partial empty sella.

Vascular: No hyperdense vessel.

Skull: Normal. Negative for fracture or focal lesion.

Sinuses/Orbits: No acute finding.

Other: The mastoid air cells are well aerated.
IMPRESSION: 1. No evidence of venous sinus thrombosis.
2. Narrowing of the distal transverse venous sinuses at the
transverse-sigmoid junction, with partial empty sella, which can
both the seen in the setting of intracranial hypertension. Correlate
with symptoms and consider CSF with opening pressure.

## 2022-07-04 ENCOUNTER — Ambulatory Visit: Payer: 59 | Admitting: Sports Medicine

## 2022-07-12 ENCOUNTER — Ambulatory Visit: Payer: 59 | Attending: Sports Medicine

## 2022-07-12 DIAGNOSIS — M5412 Radiculopathy, cervical region: Secondary | ICD-10-CM | POA: Insufficient documentation

## 2022-07-12 DIAGNOSIS — M542 Cervicalgia: Secondary | ICD-10-CM | POA: Diagnosis present

## 2022-07-12 DIAGNOSIS — R293 Abnormal posture: Secondary | ICD-10-CM | POA: Insufficient documentation

## 2022-07-12 NOTE — Therapy (Signed)
OUTPATIENT PHYSICAL THERAPY CERVICAL EVALUATION   Patient Name: Robin Suarez MRN: [id number] DOB:17-Aug-1996, 26 y.o., female Today's Date: 07/12/2022   PT End of Session - 07/12/22 1450     Visit Number 1    Number of Visits 10    Date for PT Re-Evaluation 10/04/22    Authorization Type [employer]    PT Start Time 1335    PT Stop Time 1435    PT Time Calculation (min) 60 min    Activity Tolerance Patient tolerated treatment well    Behavior During Therapy WFL for tasks assessed/performed             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   Migraine 04/16/2016   Nickel allergy    Obesity 11/11/2011   PT HAS LOST AT LEAST 100 LBS OVER PAST 9 TO 10 MONTHS-NO LONGER OBESE   Overactive bladder    NO LONGER A PROBLEM   PONV (postoperative nausea and vomiting)    NAUSEA AFTER ONE SURGERY AS A CHILD   Social anxiety disorder 11/11/2011   Vesico-ureteral reflux    BLADDER REFLUX AS A CHILD - NO PROBLEM NOW   Past Surgical History:  Procedure Laterality Date   broken arm  2002   left, repair of ulna and radius   CHOLECYSTECTOMY N/A 08/09/2014   Procedure: LAPAROSCOPIC CHOLECYSTECTOMY WITH INTRAOPERATIVE CHOLANGIOGRAM;  Surgeon: Gayland Curry, MD;  Location: [facility];  Service: General;  Laterality: N/A;   Tympanostomy with myringotomy Bilateral Gypsum EXTRACTION  10/23/2012   Patient Active Problem List   Diagnosis Date Noted   Vitamin B12 deficiency 07/23/2021   Reactive airway disease 07/23/2021   Seasonal allergies 07/23/2021   Orthostatic dizziness 06/26/2021   Rash 06/26/2021   Contraceptive management 02/23/2017   Migraine 04/16/2016   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   Obesity 11/11/2011   Anxiety and depression 11/11/2011   Nickel  allergy    Overactive bladder    Vesico-ureteral reflux     PCP: Ma Hillock, DO  REFERRING PROVIDER: Glennon Mac, DO  REFERRING DIAG:  G43.001 (ICD-10-CM) - Migraine without aura and with status migrainosus, not intractable  M54.2 (ICD-10-CM) - Neck pain  M54.12 (ICD-10-CM) - Cervical radiculopathy  G54.0 (ICD-10-CM) - Thoracic outlet syndrome    THERAPY DIAG:  Radiculopathy, cervical region  Cervicalgia  Abnormal posture  Rationale for Evaluation and Treatment Rehabilitation  ONSET DATE: January 2023  SUBJECTIVE:  SUBJECTIVE STATEMENT: Pt reports onset of neck pain in January, that was teased out from her migraines in March. She has had migraines since grade school, but medication has helped that. 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 She is hard pressed to find help, unless it's volunteers. She is sometimes out from 6am to midnight, feeding and grooming horses, etc. Carrying feed and scooping feed cause pulling in right neck. Pt is right handed, so she tends to use her right side a lot. She started noticing a tilt in her head/neck to the left earlier this year.   PERTINENT HISTORY:  Migraines   PAIN:  Are you having pain? Yes: NPRS scale: Current: 4/10, /10 Pain location: right side of neck Pain description: burning Aggravating factors: carrying and lifting with R arm, PM pain, lying for awhile (side sleeper, worse on L side), prolonged periods of looking down cause dizziness and nausea Relieving factors: neck massager pillow with heat  PRECAUTIONS: None  WEIGHT BEARING RESTRICTIONS No  FALLS:  Has patient fallen in last 6 months? No  LIVING ENVIRONMENT: Lives with: lives with their family Lives in: House/apartment Stairs: No Has following  equipment at home: None  OCCUPATION: Runs horse sanctuary  PLOF: Independent  PATIENT GOALS to reduce pain  OBJECTIVE:   DIAGNOSTIC FINDINGS:  Cervical XR: IMPRESSION: Slight reversal of normal lordosis.  No other abnormalities.  PATIENT SURVEYS:  FOTO 53% ability, 68% predicted   COGNITION: Overall cognitive status: Within functional limits for tasks assessed   SENSATION: Light touch: has numbness from shoulder to elbow, 1-3 digit numbness at times  POSTURE:  tilts head to L  PALPATION: TTP R suboccipitals, decrease closing initial at mid cervical spine, increased tonicity to L scalenes   CERVICAL ROM:   Active ROM A/PROM (deg) eval  Flexion 40  Extension 40  Right lateral flexion 12*  Left lateral flexion 24  Right rotation 42*  Left rotation 65   (Blank rows = not tested)  UPPER EXTREMITY ROM:  Active ROM Right eval Left eval  Shoulder flexion    Shoulder extension    Shoulder abduction    Shoulder adduction    Shoulder extension    Shoulder internal rotation    Shoulder external rotation    Elbow flexion    Elbow extension    Wrist flexion    Wrist extension    Wrist ulnar deviation    Wrist radial deviation    Wrist pronation    Wrist supination     (Blank rows = not tested)  UPPER EXTREMITY MMT:  MMT Right eval Left eval  Shoulder flexion    Shoulder extension    Shoulder abduction    Shoulder adduction    Shoulder extension    Shoulder internal rotation    Shoulder external rotation    Middle trapezius    Lower trapezius    Elbow flexion    Elbow extension    Wrist flexion    Wrist extension    Wrist ulnar deviation    Wrist radial deviation    Wrist pronation    Wrist supination    Grip strength     (Blank rows = not tested)  CERVICAL SPECIAL TESTS:  Neck flexor muscle endurance test: tightness at 34 seconds, Upper limb tension test (ULTT): Positive, Spurling's test: Positive, and Distraction test:  Positive   FUNCTIONAL TESTS:  NT   TODAY'S TREATMENT:  See HEP Manual therapy: cervical traction, cervical AROM, L scalenes  stretch   PATIENT EDUCATION:  Education details: cervical and spinal alignment, diagnosis, prognosis, FOTO, POC, HEP Person educated: Patient Education method: Explanation, Demonstration, Tactile cues, Verbal cues, and Handouts Education comprehension: verbalized understanding, returned demonstration, verbal cues required, and tactile cues required   HOME EXERCISE PROGRAM: Access Code: [email or web address] URL: [email or web address] Date: 07/12/2022 Prepared by: Gardiner Rhyme  Exercises - Supine Cervical Retraction with Towel  - 2 x daily - 7 x weekly - 1 sets - 10 reps - 5 seconds hold - Cervical Retraction at Wall  - 2 x daily - 7 x weekly - 1-2 sets - 10 reps - 5 seconds hold - Seated Thoracic Extension with Pectoralis Stretch  - 2 x daily - 7 x weekly - 1 sets - 10 reps - Shoulder External Rotation and Scapular Retraction with Resistance  - 1 x daily - 7 x weekly - 1-2 sets - 10 reps - 3 seconds hold - Neck Mobilization with Foam Roller  - 1 x daily - 7 x weekly - Thoracic Extension Mobilization on Foam Roll  - 1 x daily - 7 x weekly - 1-2 sets - 10 reps  ASSESSMENT:  CLINICAL IMPRESSION: Patient is a 26 y.o. female who was seen today for physical therapy evaluation and treatment for cervical radiculopathy. She has a long history of migraines, but neck pain began in January this year. 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 She was positive for all Cervical Radiculopathy testing and responded well to manual therapy, ending the session with full cervical ROM and significant reduction in cervical tension. She demonstrates decreased R cervical rotation and lateral flexion, positive radiculopathy cluster, resting left cervical tilt, and fairly normal DNF at 34 seconds but reproducing neck  tension. Pt was educated on cervical and spinal alignment, diagnosis, prognosis, FOTO, POC, HEP verbalizing understanding and consent to tx. She would benefit from skilled PT at least 1x/week for 6-8 weeks to restore pain free mobility.    OBJECTIVE IMPAIRMENTS decreased ROM, decreased strength, increased fascial restrictions, impaired perceived functional ability, impaired flexibility, impaired sensation, impaired UE functional use, postural dysfunction, and pain.   ACTIVITY LIMITATIONS carrying, lifting, and sleeping  PARTICIPATION LIMITATIONS: occupation  PERSONAL FACTORS Profession, Time since onset of injury/illness/exacerbation, and 1-2 comorbidities: depression, anxiety  are also affecting patient's functional outcome.   REHAB POTENTIAL: Good  CLINICAL DECISION MAKING: Stable/uncomplicated  EVALUATION COMPLEXITY: Low   GOALS: Goals reviewed with patient? No  SHORT TERM GOALS: Target date: 08/02/2022   Pt will be independent and compliant with initial HEP. Baseline: provided at eval Goal status: INITIAL  2.  Pt will demonstrate neutral cervical positioning with no lateral tilt. Baseline: left lateral tilt Goal status: INITIAL    LONG TERM GOALS: Target date: 09/06/2022  Pt will be independent with advanced HEP to continue to manage any continuing symptoms or recurrences.  Baseline: not provided yet Goal status: INITIAL  2.  Pt will demonstrate pain free cervical AROM WFL Baseline: right lateral flexion 14, right rotation 42, painful Goal status: INITIAL  3.  Pt will increase FOTO ability to at least 68% ability, in order to demonstrate meaningful change in perceived level of functional ability.   Baseline: 53% ability Goal status: INITIAL  4.  Pt will perform DNF testing for >35 seconds with no reproduction of pain. Baseline: 34 seconds Goal status: INITIAL  5.  Pt will be able to lift with RUE without causing neck  and RUE symptoms. Baseline: pain Goal  status: INITIAL  6.  Pt will be able to sleep through the night without waking up with RUE numbness. Baseline: numbness in arm and 1-3 digits of R hand. Goal status: INITIAL   PLAN: PT FREQUENCY: 1x/week  PT DURATION: 8 weeks  PLANNED INTERVENTIONS: Therapeutic exercises, Therapeutic activity, Neuromuscular re-education, Balance training, Gait training, Patient/Family education, Self Care, Joint mobilization, Joint manipulation, Dry Needling, Electrical stimulation, Spinal manipulation, Spinal mobilization, Cryotherapy, Moist heat, Taping, Vasopneumatic device, Traction, Manual therapy, and Re-evaluation  PLAN FOR NEXT SESSION: assess response to HEP/update PRN, manual therapy, modalities PRN   Marcelline Mates, PT, DPT 07/12/2022, 2:50 PM

## 2022-07-12 NOTE — Patient Instructions (Signed)
Access Code: [email or web address] URL: [email or web address] Date: 07/12/2022 Prepared by: Gardiner Rhyme  Exercises - Supine Cervical Retraction with Towel  - 2 x daily - 7 x weekly - 1 sets - 10 reps - 5 seconds hold - Cervical Retraction at Wall  - 2 x daily - 7 x weekly - 1-2 sets - 10 reps - 5 seconds hold - Seated Thoracic Extension with Pectoralis Stretch  - 2 x daily - 7 x weekly - 1 sets - 10 reps - Shoulder External Rotation and Scapular Retraction with Resistance  - 1 x daily - 7 x weekly - 1-2 sets - 10 reps - 3 seconds hold - Neck Mobilization with Foam Roller  - 1 x daily - 7 x weekly - Thoracic Extension Mobilization on Foam Roll  - 1 x daily - 7 x weekly - 1-2 sets - 10 reps

## 2022-07-30 ENCOUNTER — Ambulatory Visit: Payer: 59 | Admitting: Sports Medicine

## 2022-07-30 NOTE — Progress Notes (Deleted)
    Aleen Sells [facility] [address] [place] [address] Phone: [phone]   Assessment and Plan:     There are no diagnoses linked to this encounter.  ***   Pertinent previous records reviewed include ***   Follow Up: ***     Subjective:   I, Robin Suarez, am serving as a Neurosurgeon for Doctor Richardean Sale  Chief Complaint: neck pain    HPI:  06/13/2022 Patient is a 26 year old female complaining of neck pain. Patient states that in January she has a hx of migraines that as neck pain that comes with it, does get numbness and tingling gon the right side that foes down to the fingers does also get a burning sensation on the back of the head that goes down the arm, hx of neck injuries from horseback riding and weightlifting , side to side movement makes her nauseas and dizzy, robaxin didn't work for her also gave her panic attacks , also Advil and tylenol   08/07/2022 Patient states     Relevant Historical Information: Migraines  Additional pertinent review of systems negative.   Current Outpatient Medications:    albuterol (VENTOLIN HFA) 108 (90 Base) MCG/ACT inhaler, Inhale 2 puffs into the lungs every 6 (six) hours as needed for wheezing or shortness of breath., Disp: 8 g, Rfl: 1   Cyanocobalamin 1000 MCG SUBL, 1 tab placed under the tongue daily., Disp: 90 tablet, Rfl: 3   fluocinolone (VANOS) 0.01 % cream, Apply topically 2 (two) times daily., Disp: 30 g, Rfl: 0   Magnesium 250 MG TABS, , Disp: , Rfl:    meloxicam (MOBIC) 15 MG tablet, Take 1 tablet (15 mg total) by mouth daily., Disp: 30 tablet, Rfl: 0   montelukast (SINGULAIR) 10 MG tablet, Take 1 tablet (10 mg total) by mouth at bedtime., Disp: 30 tablet, Rfl: 5   ondansetron (ZOFRAN) 4 MG tablet, Take 1 tablet (4 mg total) by mouth every 8 (eight) hours as needed for nausea or vomiting., Disp: 20 tablet, Rfl: 5   SUMAtriptan (IMITREX) 100 MG tablet, Take 1 tablet  earliest onset of migraine.  May repeat in 2 hours if headache persists or recurs.  Maximum 2 tablets in 24 hours., Disp: 10 tablet, Rfl: 5   topiramate (TOPAMAX) 50 MG tablet, Take 1 tablet (50 mg total) by mouth at bedtime., Disp: 30 tablet, Rfl: 5   Vitamin D, Cholecalciferol, 25 MCG (1000 UT) CAPS, , Disp: , Rfl:    Objective:     There were no vitals filed for this visit.    There is no height or weight on file to calculate BMI.    Physical Exam:    ***   Electronically signed by:  Aleen Sells [facility] 9:12 AM 07/30/22

## 2022-08-02 ENCOUNTER — Ambulatory Visit: Payer: 59

## 2022-08-06 ENCOUNTER — Ambulatory Visit: Payer: 59 | Admitting: Sports Medicine

## 2022-08-07 ENCOUNTER — Ambulatory Visit: Payer: 59 | Admitting: Sports Medicine

## 2022-08-07 NOTE — Progress Notes (Deleted)
    Robin Suarez [facility] [address] [place] [address] Phone: [phone]   Assessment and Plan:     There are no diagnoses linked to this encounter.  ***   Pertinent previous records reviewed include ***   Follow Up: ***     Subjective:   I, Robin Suarez, am serving as a Neurosurgeon for Robin Suarez  Chief Complaint: neck pain    HPI:  06/13/2022 Patient is a 26 year old female complaining of neck pain. Patient states that in January she has a hx of migraines that as neck pain that comes with it, does get numbness and tingling gon the right side that foes down to the fingers does also get a burning sensation on the back of the head that goes down the arm, hx of neck injuries from horseback riding and weightlifting , side to side movement makes her nauseas and dizzy, robaxin didn't work for her also gave her panic attacks , also Advil and tylenol    08/12/2022 Patient states   Relevant Historical Information: Migraines  Additional pertinent review of systems negative.   Current Outpatient Medications:    albuterol (VENTOLIN HFA) 108 (90 Base) MCG/ACT inhaler, Inhale 2 puffs into the lungs every 6 (six) hours as needed for wheezing or shortness of breath., Disp: 8 g, Rfl: 1   Cyanocobalamin 1000 MCG SUBL, 1 tab placed under the tongue daily., Disp: 90 tablet, Rfl: 3   fluocinolone (VANOS) 0.01 % cream, Apply topically 2 (two) times daily., Disp: 30 g, Rfl: 0   Magnesium 250 MG TABS, , Disp: , Rfl:    meloxicam (MOBIC) 15 MG tablet, Take 1 tablet (15 mg total) by mouth daily., Disp: 30 tablet, Rfl: 0   montelukast (SINGULAIR) 10 MG tablet, Take 1 tablet (10 mg total) by mouth at bedtime., Disp: 30 tablet, Rfl: 5   ondansetron (ZOFRAN) 4 MG tablet, Take 1 tablet (4 mg total) by mouth every 8 (eight) hours as needed for nausea or vomiting., Disp: 20 tablet, Rfl: 5   SUMAtriptan (IMITREX) 100 MG tablet, Take 1 tablet earliest  onset of migraine.  May repeat in 2 hours if headache persists or recurs.  Maximum 2 tablets in 24 hours., Disp: 10 tablet, Rfl: 5   topiramate (TOPAMAX) 50 MG tablet, Take 1 tablet (50 mg total) by mouth at bedtime., Disp: 30 tablet, Rfl: 5   Vitamin D, Cholecalciferol, 25 MCG (1000 UT) CAPS, , Disp: , Rfl:    Objective:     There were no vitals filed for this visit.    There is no height or weight on file to calculate BMI.    Physical Exam:    ***   Electronically signed by:  Robin Suarez [facility] 9:30 AM 08/07/22

## 2022-08-09 ENCOUNTER — Ambulatory Visit: Payer: 59

## 2022-08-16 ENCOUNTER — Ambulatory Visit: Payer: 59 | Admitting: Sports Medicine

## 2022-10-09 ENCOUNTER — Encounter: Payer: Self-pay | Admitting: Neurology

## 2022-10-10 ENCOUNTER — Other Ambulatory Visit: Payer: Self-pay | Admitting: Neurology

## 2022-10-10 MED ORDER — SUMATRIPTAN SUCCINATE 100 MG PO TABS: ORAL_TABLET | ORAL | 5 refills | Status: DC

## 2022-10-10 MED ORDER — NORTRIPTYLINE HCL 10 MG PO CAPS: 10.0000 mg | ORAL_CAPSULE | Freq: Every day | ORAL | 5 refills | Status: DC

## 2022-11-01 ENCOUNTER — Other Ambulatory Visit: Payer: Self-pay | Admitting: Neurology

## 2022-11-10 IMAGING — DX DG CERVICAL SPINE 2 OR 3 VIEWS
3 series · 3 of 3 positions shown · non-contrast
Comparison: None Available.

CLINICAL DATA: Neck pain

EXAM:
CERVICAL SPINE - 2-3 VIEW

[c-spine lat]
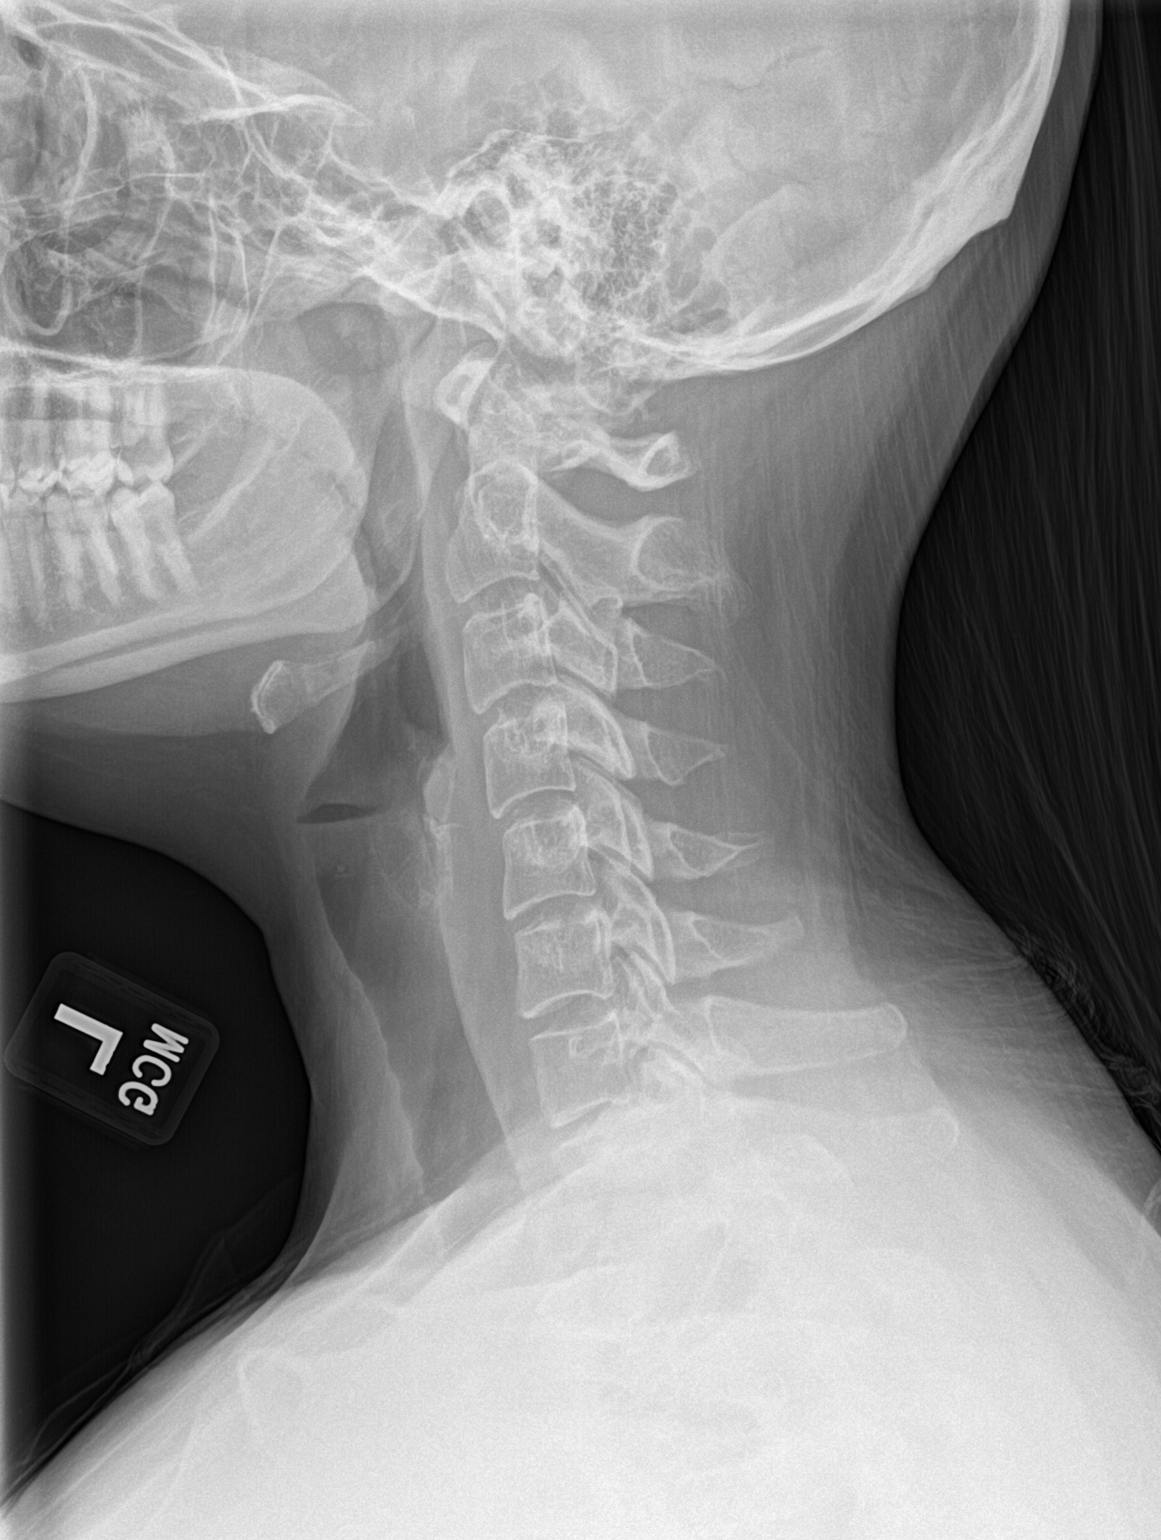

[c-spine ap]
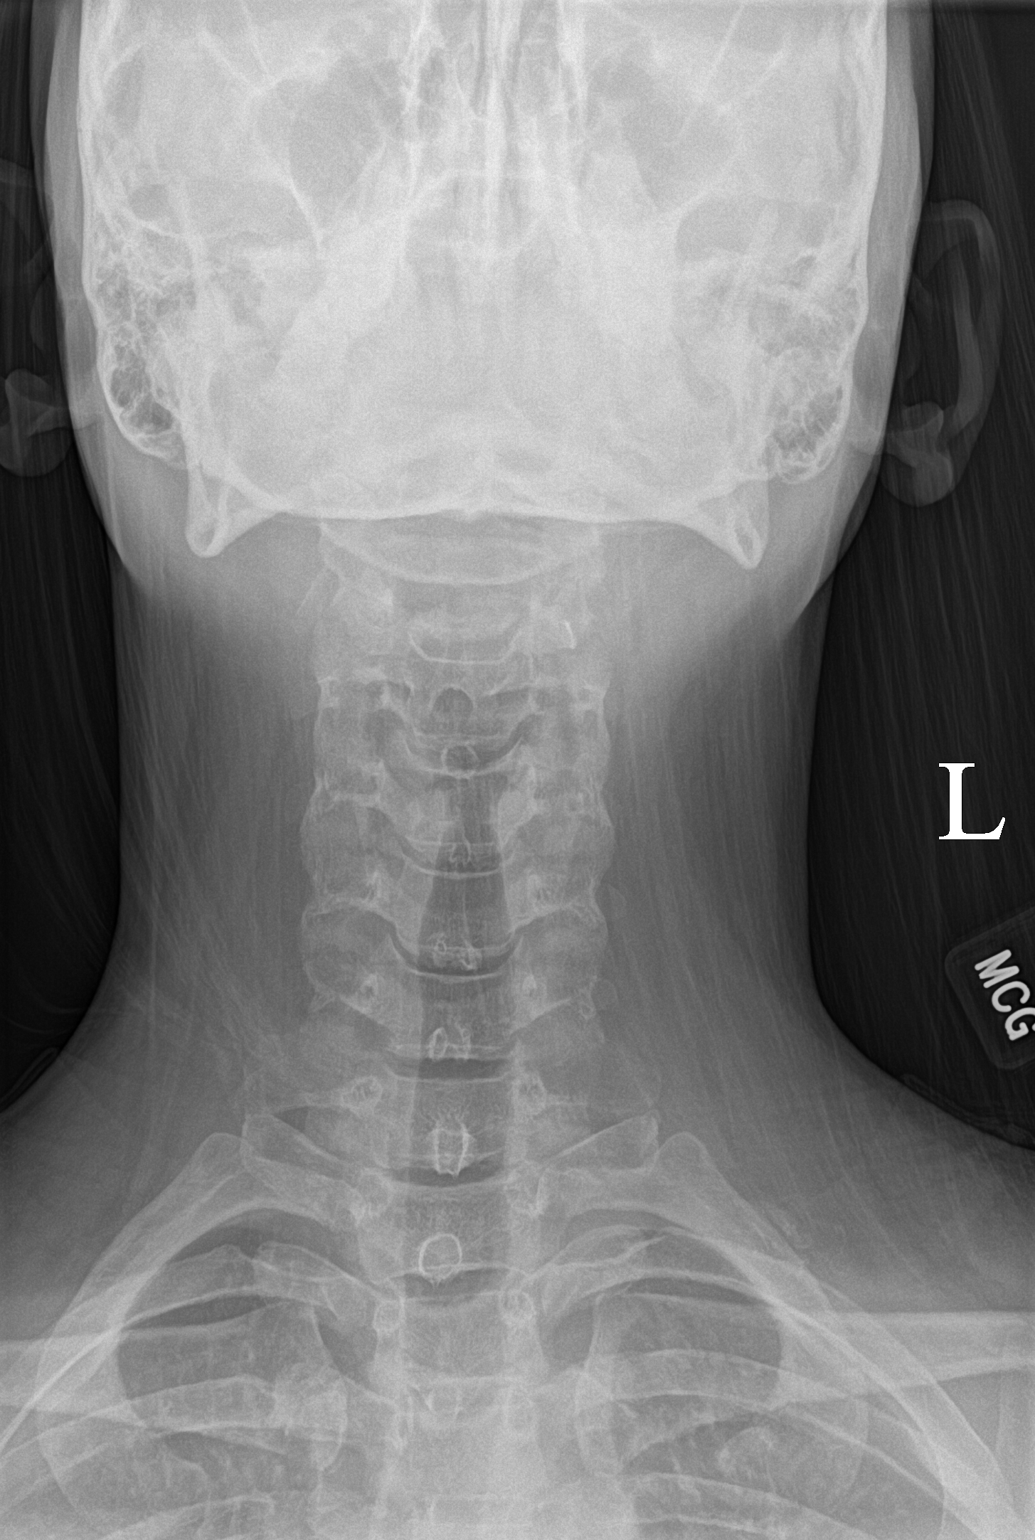

[c-spine open mouth]
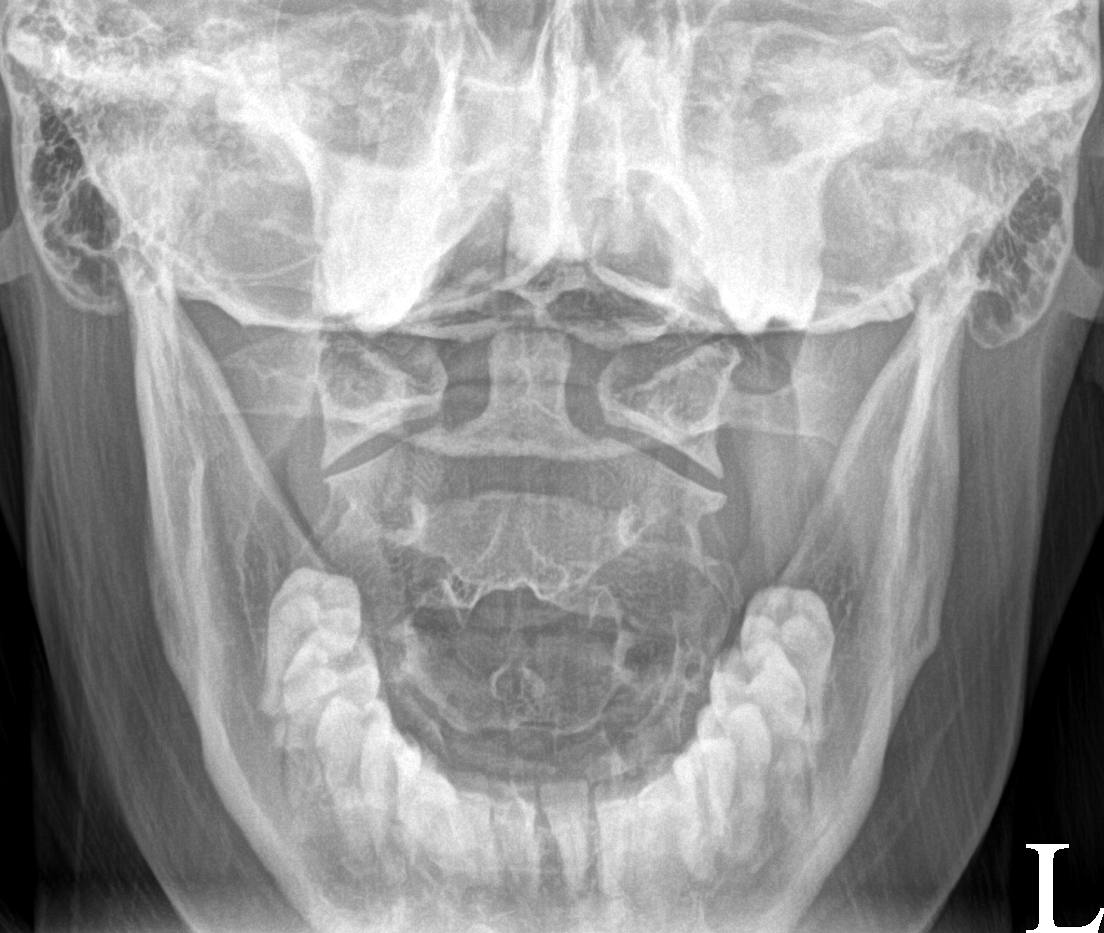

[3 of 3 positions shown; findings below may reference images not displayed]

FINDINGS: Slight reversal of normal lordosis. No other malalignment. No
degenerative changes identified. No fractures. No other bony or soft
tissue abnormalities.
IMPRESSION: Slight reversal of normal lordosis.  No other abnormalities.

## 2022-11-18 ENCOUNTER — Encounter: Payer: Self-pay | Admitting: Neurology

## 2022-12-06 NOTE — Progress Notes (Unsigned)
NEUROLOGY FOLLOW UP OFFICE NOTE  ADELYNA MILLIKEN [id number]  Assessment/Plan:   Pregnant at [redacted] weeks gestation Migraine without aura, without status migrainosus, not intractable Migraine with aura Right sided occipital neuralgia Cervicalgia Right sided cervical radiculopathy Thoracic outlet syndrome   Migraine prevention:  magnesium oxide 400mg  daily Migraine rescue:  Tylenol and Zofran 4mg  Limit use of pain relievers to no more than 2 days out of week to prevent risk of rebound or medication-overuse headache. Keep headache diary Follow up 6 months.         Subjective:  Mackynzie E Naser is a 26 year old female currently [redacted] weeks pregnant who follows up for migraines.   UPDATE: Discontinued topiramate due to causing dizziness and aggravating her anxiety.  Started nortriptyline in October.  However, she discontinued it the following month because she found out she was pregnant.  Now on magnesium.  No recent migraines.  May get a mild headache from time to time which responds to Tylenol.    Saw Dr. Glennon Mac of Sports Medicine.  Cervical X-ray showed loss of typical cervical lordosis but otherwise unremarkable.  Diagnosed with muscular cause of thoracic outlet syndrome.  Started physical therapy which was not helpful.    Current NSAIDS/analgesics:  Tylenol, Advil Current triptans:  none Current ergotamine:  none Current anti-emetic:  Zofran 4mg  Current muscle relaxants:  none Current Antihypertensive medications:  none Current Antidepressant medications:  none Current Anticonvulsant medications: none Current anti-CGRP:  none Current Vitamins/Herbal/Supplements:  none Current Antihistamines/Decongestants:  none Other therapy:  none Hormone/birth control:  none  Caffeine:  no Diet:  increased water intake (at least a gallon).  Skips meals (breakfast).  Does not snack Exercise:  was doing spin class Depression:  no; Anxiety:  no Other pain:  no Sleep hygiene:   good  HISTORY:  History of migraines since 26 years old.  Location:  left or right side.  Quality:  pressure, throbbing, sometimes pounding.  Intensity:  6/10.  Aura:  squiggly dark or bright line expanding into kaledoscope with visual field cut for 10-15 minutes (not always followed by headache).  Associated symptoms:  nausea, photophobia, phonophobia.  No longer with vomiting.  She denies associated unilateral numbness or weakness. Duration:  at least an hour up to 2-3 hours Frequency:  initially once a week, previously daily.  Triggers:  stress, caffeine  Relieving factors:  dark room, rest/sleep, sometimes drinking cold water  Activity:  aggravates   In July 2022, she developed a new type of headache.  Intermittent non-throbbing headache at the vertex with some photophobia but no phonophobia, nausea, vomiting, visual disturbance, numbness or weakness.  Also with posterior neck pain.  Usually lasts 1 day but may last up to 3 days.  They were occurring daily.  Treating with Tylenol or Advil.  Sometimes had a high-pitched tone but no pulsatile tinnitus.  Denied visual obscurations or blurred vision.  No fever.  She was seen in the ED on 01/21/2022 where CTV of head on 01/22/2022 personally reviewed showed narrowing of the distal transverse venous sinuses at the transverse-sigmoid junction with partial empty sella.  She discontinued her birth control (LOESTRIN).  Her PCP noted sinusitis and started her on doxycycline and topiramate.  Since starting topiramate, they have been occurring once a week and neck pain is mild.  She saw the eye doctor who found no evidence of papilledema.       Past NSAIDS/analgesics:  none Past abortive triptans:  sumatriptan 100mg  Past abortive ergotamine:  none Past muscle relaxants:  methocarbamol Past anti-emetic:  promethazine, ondansetron Past antihypertensive medications:  none Past antidepressant medications:  nortriptyline 10mg  (stopped due to pregnancy),  citalopram Past anticonvulsant medications:  topiramate Past anti-CGRP:  none Past vitamins/Herbal/Supplements:  none Past antihistamines/decongestants:  Flonase, meclizine Other past therapies:  none    Family history of headache:  father  PAST MEDICAL HISTORY: 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 90 tablet 3   fluocinolone (VANOS) 0.01 % cream Apply topically 2 (two) times daily. 30 g 0   Magnesium 250 MG TABS      ondansetron (ZOFRAN) 4 MG tablet Take 1 tablet (4 mg total) by mouth every 8 (eight) hours as needed for nausea or vomiting. 20 tablet 5   Prenatal-FeFum-FA-DHA w/o A (PRENATAL + DHA PO) Take by mouth.     No current facility-administered medications on file prior to visit.     ALLERGIES: Allergies  Allergen Reactions   Cefprozil Other (See Comments)    RASH HEAD TO TOE   Gardasil 9 [Hpv 9-Valent Recomb Vaccine]     Reaction to 2nd injection. Hives. Throat symptoms.    Nickel     Rash with contact   Sulfa Antibiotics     NAUSEA, GI UPSET    FAMILY HISTORY: Family History  Problem Relation Age of Onset   Stroke Father    Heart disease Father    Depression Father    Diabetes Father        type 2   Other  Father        muscular spasm of the heart   Hyperlipidemia Father    Heart attack Father    Hyperlipidemia Maternal Grandmother    Hypertension Maternal Grandmother    Diabetes Maternal Grandmother        type 2   Skin cancer Maternal Grandmother    Cancer Maternal Grandfather        laryngeal, alcohol and tobacco   Other Paternal Grandmother        arrythmia   Diabetes Paternal Grandmother        type 2   Pancreatic cancer Paternal Grandmother 77   Diabetes Paternal Grandfather        type 2   Hyperlipidemia Paternal Grandfather    Other Paternal Grandfather        fluid around the heart   Rheum arthritis Paternal Grandfather    Alzheimer's disease Paternal Grandfather       Objective:  Blood pressure 125/85, pulse 100, height 5\' 3"  (1.6 m), weight 256 lb 12.8 oz (116.5 kg), last menstrual period 06/09/2022, SpO2 97 %.  General: No acute distress.  Patient appears well-groomed.   Head:  Normocephalic/atraumatic Eyes:  Fundi examined but not visualized Neck: supple, right paraspinal tenderness, full range of motion Heart:  Regular rate and rhythm Neurological Exam: alert and oriented to person, place, and time.  Speech fluent and not dysarthric, language intact.  CN II-XII intact. Bulk and tone normal, muscle strength 5/5 throughout.  Sensation to light touch intact.  Deep tendon reflexes 2+ throughout.  Finger to nose testing intact.  Gait normal, Romberg negative.   Shon Millet, DO  CC: Felix Pacini, DO

## 2022-12-09 ENCOUNTER — Encounter: Payer: Self-pay | Admitting: Neurology

## 2022-12-09 ENCOUNTER — Ambulatory Visit: Payer: 59 | Admitting: Neurology

## 2022-12-09 VITALS — BP 125/85 | HR 100 | Ht 63.0 in | Wt 256.8 lb

## 2022-12-09 DIAGNOSIS — G43009 Migraine without aura, not intractable, without status migrainosus: Secondary | ICD-10-CM | POA: Diagnosis not present

## 2022-12-09 DIAGNOSIS — Z3A08 8 weeks gestation of pregnancy: Secondary | ICD-10-CM

## 2022-12-09 NOTE — Patient Instructions (Addendum)
Take magnesium oxide 400mg  daily Tylenol and Zofran as needed.  Limit use of pain relievers to no more than 2 days out of week to prevent risk of rebound or medication-overuse headache. Follow up 6 months.

## 2022-12-30 ENCOUNTER — Encounter: Payer: Self-pay | Admitting: Neurology

## 2023-01-09 ENCOUNTER — Encounter: Payer: Self-pay | Admitting: Neurology

## 2023-03-14 ENCOUNTER — Ambulatory Visit (INDEPENDENT_AMBULATORY_CARE_PROVIDER_SITE_OTHER): Payer: 59 | Admitting: Family Medicine

## 2023-03-14 ENCOUNTER — Encounter: Payer: Self-pay | Admitting: Family Medicine

## 2023-03-14 VITALS — BP 116/78 | HR 75 | Temp 98.3°F | Wt 232.6 lb

## 2023-03-14 DIAGNOSIS — R591 Generalized enlarged lymph nodes: Secondary | ICD-10-CM | POA: Diagnosis not present

## 2023-03-14 DIAGNOSIS — L089 Local infection of the skin and subcutaneous tissue, unspecified: Secondary | ICD-10-CM | POA: Diagnosis not present

## 2023-03-14 DIAGNOSIS — J302 Other seasonal allergic rhinitis: Secondary | ICD-10-CM

## 2023-03-14 MED ORDER — DOXYCYCLINE HYCLATE 100 MG PO TABS: 100.0000 mg | ORAL_TABLET | Freq: Two times a day (BID) | ORAL | 0 refills | Status: DC

## 2023-03-14 NOTE — Patient Instructions (Signed)
No follow-ups on file.        Great to see you today.  I have refilled the medication(s) we provide.   If labs were collected, we will inform you of lab results once received either by echart message or telephone call.   - echart message- for normal results that have been seen by the patient already.   - telephone call: abnormal results or if patient has not viewed results in their echart.  

## 2023-03-14 NOTE — Progress Notes (Signed)
Robin Suarez , Jan 09, 1996, 27 y.o., female MRN: [id number] Patient Care Team    Relationship Specialty Notifications Start End  Ma Hillock, DO PCP - General Family Medicine  07/20/21   Lelon Perla, MD PCP - Cardiology Cardiology  02/13/22   Obgyn, Erling Conte    01/30/22     Chief Complaint  Patient presents with   Neck Pain    Both sides; few weeks on left side 2-3 days for the right side      Subjective: Robin Suarez is a 27 y.o. Pt presents for an OV with complaints of bilateral neck discomfort over a few weeks duration.  She reports over the last 2 to 3 days she has noticed left-sided pain associated with the onset of a new lymph node.  She does feel like the lymph node is decreasing.  She has noticed her allergies have been worsening over the last couple weeks.  She does endorse having a scalp lesion that has been healing.  She denies any fevers or chills Patient's last menstrual period was 02/28/2023.      03/14/2023   10:43 AM 01/15/2022    1:14 PM 07/20/2021    3:11 PM 11/29/2020    1:08 PM  Depression screen PHQ 2/9  Decreased Interest 1 0 0 0  Down, Depressed, Hopeless 1 0 0 0  PHQ - 2 Score 2 0 0 0  Altered sleeping 1 1 0 0  Tired, decreased energy 1 1 1 1   Change in appetite 0 0 0 0  Feeling bad or failure about yourself  1 0 0 0  Trouble concentrating 0 0 0 0  Moving slowly or fidgety/restless 0 0 0 0  Suicidal thoughts 0 0 0 0  PHQ-9 Score 5 2 1 1     Allergies  Allergen Reactions   Cefprozil Other (See Comments)    RASH HEAD TO TOE   Gardasil 9 [Hpv 9-Valent Recomb Vaccine]     Reaction to 2nd injection. Hives. Throat symptoms.    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:      -Wears a bicycle helmet riding a bike: Yes     -smoke alarm  in the home:Yes     - wears seatbelt: Yes     - Feels safe in their relationships: Yes      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   Migraine 04/16/2016   Nickel allergy    Obesity 11/11/2011   PT HAS LOST AT LEAST 100 LBS OVER PAST 9 TO 10 MONTHS-NO LONGER OBESE   Overactive bladder    NO LONGER A PROBLEM   PONV (postoperative nausea and vomiting)    NAUSEA AFTER ONE SURGERY AS A CHILD   Social anxiety disorder 11/11/2011   Vesico-ureteral reflux    BLADDER REFLUX AS A CHILD - NO PROBLEM NOW   Past Surgical History:  Procedure Laterality Date   broken arm  2002   left, repair of ulna and radius  CHOLECYSTECTOMY N/A 08/09/2014   Procedure: LAPAROSCOPIC CHOLECYSTECTOMY WITH INTRAOPERATIVE CHOLANGIOGRAM;  Surgeon: Gayland Curry, MD;  Location: [facility];  Service: General;  Laterality: N/A;   Tympanostomy with myringotomy Bilateral 1996   WISDOM TOOTH EXTRACTION  10/23/2012   Family History  Problem Relation Age of Onset   Stroke Father    Heart disease Father    Depression Father    Diabetes Father        type 2   Other Father        muscular spasm of the heart   Hyperlipidemia Father    Heart attack Father    Hyperlipidemia Maternal Grandmother    Hypertension Maternal Grandmother    Diabetes Maternal Grandmother        type 2   Skin cancer Maternal Grandmother    Cancer Maternal Grandfather        laryngeal, alcohol and tobacco   Other Paternal Grandmother        arrythmia   Diabetes Paternal Grandmother        type 2   Pancreatic cancer Paternal Grandmother 11   Diabetes Paternal Grandfather        type 2   Hyperlipidemia Paternal Grandfather    Other Paternal Grandfather        fluid around the heart   Rheum arthritis Paternal Grandfather     Alzheimer's disease Paternal Grandfather    Allergies as of 03/14/2023       Reactions   Cefprozil Other (See Comments)   RASH HEAD TO TOE   Gardasil 9 [hpv 9-valent Recomb Vaccine]    Reaction to 2nd injection. Hives. Throat symptoms.    Nickel    Rash with contact   Sulfa Antibiotics    NAUSEA, GI UPSET        Medication List        Accurate as of March 14, 2023 10:57 AM. If you have any questions, ask your nurse or doctor.          STOP taking these medications    fluocinolone 0.01 % cream Commonly known as: VANOS Stopped by: Howard Pouch, DO   PRENATAL + DHA PO Stopped by: Howard Pouch, DO       TAKE these medications    Cyanocobalamin 1000 MCG Subl 1 tab placed under the tongue daily.   Magnesium 250 MG Tabs   ondansetron 4 MG tablet Commonly known as: Zofran Take 1 tablet (4 mg total) by mouth every 8 (eight) hours as needed for nausea or vomiting.   phentermine 37.5 MG capsule Take 37.5 mg by mouth every morning.        All past medical history, surgical history, allergies, family history, immunizations andmedications were updated in the EMR today and reviewed under the history and medication portions of their EMR.     ROS Negative, with the exception of above mentioned in HPI   Objective:  BP 116/78   Pulse 75   Temp 98.3 F (36.8 C)   Wt 232 lb 9.6 oz (105.5 kg)   LMP 02/28/2023   SpO2 98%   Breastfeeding Unknown   BMI 41.20 kg/m  Body mass index is 41.2 kg/m. Physical Exam Vitals and nursing note reviewed.  Constitutional:      General: She is not in acute distress.    Appearance: Normal appearance. She is normal weight. She is not ill-appearing or toxic-appearing.  HENT:     Head: Normocephalic and atraumatic.     Comments: Mild  bilateral effusion , no erythema.    Right Ear: Ear canal and external ear normal. There is no impacted cerumen.     Left Ear: Ear canal and external ear normal. There is no impacted cerumen.      Nose: No congestion or rhinorrhea.     Mouth/Throat:     Pharynx: No oropharyngeal exudate or posterior oropharyngeal erythema.  Eyes:     General: No scleral icterus.       Right eye: No discharge.        Left eye: No discharge.     Extraocular Movements: Extraocular movements intact.     Conjunctiva/sclera: Conjunctivae normal.     Pupils: Pupils are equal, round, and reactive to light.  Cardiovascular:     Rate and Rhythm: Normal rate and regular rhythm.  Musculoskeletal:     Cervical back: Neck supple. Tenderness present.  Lymphadenopathy:     Cervical: Cervical adenopathy present.  Skin:    Findings: Lesion (X 3 excoriated skin lesions posterior occipital with mild erythema.  No fluctuance noted.) present. No rash.  Neurological:     Mental Status: She is alert and oriented to person, place, and time. Mental status is at baseline.     Motor: No weakness.     Coordination: Coordination normal.     Gait: Gait normal.  Psychiatric:        Mood and Affect: Mood normal.        Behavior: Behavior normal.        Thought Content: Thought content normal.        Judgment: Judgment normal.      No results found. No results found. No results found for this or any previous visit (from the past 24 hour(s)).  Assessment/Plan: Arian E Alvira is a 27 y.o. female present for OV for  Patient has known thoracic outlet syndrome causing neck discomfort.  New neck discomfort is likely secondary to eustachian tube dysfunction secondary to onset of allergy season and possibly lymphadenopathy.  She is tender over the left posterior cervical lymph node today.  She has 3 skin healing scalp lesions with mild erythema remaining which is likely the cause of her lymphadenopathy. Seasonal allergies encouraged her to start her over-the-counter antihistamine before bed nightly.  Plus Flonase nasal spray Skin lesion/lymphadenopathy: Doxycycline twice daily x 5 days for the skin lesions. Follow-up as  needed Reviewed expectations re: course of current medical issues. Discussed self-management of symptoms. Outlined signs and symptoms indicating need for more acute intervention. Patient verbalized understanding and all questions were answered. Patient received an After-Visit Summary.    No orders of the defined types were placed in this encounter.  No orders of the defined types were placed in this encounter.  Referral Orders  No referral(s) requested today     Note is dictated utilizing voice recognition software. Although note has been proof read prior to signing, occasional typographical errors still can be missed. If any questions arise, please do not hesitate to call for verification.   electronically signed by:  Howard Pouch, DO  [place]

## 2023-04-21 ENCOUNTER — Other Ambulatory Visit: Payer: Self-pay | Admitting: Neurology

## 2023-06-09 NOTE — Progress Notes (Unsigned)
NEUROLOGY FOLLOW UP OFFICE NOTE  [facility] [id number]  Assessment/Plan:   Pregnant at [redacted] weeks gestation Migraine without aura, without status migrainosus, not intractable Migraine with aura Right sided occipital neuralgia Cervicalgia Right sided cervical radiculopathy Thoracic outlet syndrome   Migraine prevention:  magnesium oxide 400mg  daily Migraine rescue:  Tylenol and Zofran 4mg  Limit use of pain relievers to no more than 2 days out of week to prevent risk of rebound or medication-overuse headache. Keep headache diary Follow up 6 months.         Subjective:  Robin Suarez is a 27 year old female who follows up for migraines.   UPDATE: Since last visit, she found out that her pregnancy was not viable.  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  She is currently trying to conceive, so she has been on ***  Current NSAIDS/analgesics:  Tylenol, Advil Current triptans:  none Current ergotamine:  none Current anti-emetic:  Zofran 4mg  Current muscle relaxants:  none Current Antihypertensive medications:  none Current Antidepressant medications:  none Current Anticonvulsant medications: none Current anti-CGRP:  none Current Vitamins/Herbal/Supplements:  none Current Antihistamines/Decongestants:  none Other therapy:  none Hormone/birth control:  none  Caffeine:  no Diet:  increased water intake (at least a gallon).  Skips meals (breakfast).  Does not snack Exercise:  was doing spin class Depression:  no; Anxiety:  no Other pain:  no Sleep hygiene:  good  HISTORY:  History of migraines since 27 years old.  Location:  left or right side.  Quality:  pressure, throbbing, sometimes pounding.  Intensity:  6/10.  Aura:  squiggly dark or bright line expanding into kaledoscope with visual field cut for 10-15 minutes (not always followed by headache).  Associated symptoms:  nausea, photophobia, phonophobia.  No longer with  vomiting.  She denies associated unilateral numbness or weakness. Duration:  at least an hour up to 2-3 hours Frequency:  initially once a week, previously daily.  Triggers:  stress, caffeine  Relieving factors:  dark room, rest/sleep, sometimes drinking cold water  Activity:  aggravates   In July 2022, she developed a new type of headache.  Intermittent non-throbbing headache at the vertex with some photophobia but no phonophobia, nausea, vomiting, visual disturbance, numbness or weakness.  Also with posterior neck pain.  Usually lasts 1 day but may last up to 3 days.  They were occurring daily.  Treating with Tylenol or Advil.  Sometimes had a high-pitched tone but no pulsatile tinnitus.  Denied visual obscurations or blurred vision.  No fever.  She was seen in the ED on 01/21/2022 where CTV of head on 01/22/2022 personally reviewed showed narrowing of the distal transverse venous sinuses at the transverse-sigmoid junction with partial empty sella.  She discontinued her birth control (LOESTRIN).  She saw the eye doctor who found no evidence of papilledema.  Saw Dr. Jean Rosenthal of Sports Medicine.  Cervical X-ray showed loss of typical cervical lordosis but otherwise unremarkable.  Diagnosed with muscular cause of thoracic outlet syndrome.      Past NSAIDS/analgesics:  none Past abortive triptans:  sumatriptan 100mg  Past abortive ergotamine:  none Past muscle relaxants:  methocarbamol Past anti-emetic:  promethazine, ondansetron Past antihypertensive medications:  none Past antidepressant medications:  nortriptyline 10mg  (stopped due to pregnancy), citalopram Past anticonvulsant medications:  topiramate Past anti-CGRP:  none Past vitamins/Herbal/Supplements:  none Past antihistamines/decongestants:  Flonase, meclizine Other past therapies:  none    Family history of headache:  father  PAST MEDICAL HISTORY: Past Medical History:  Diagnosis Date   Anxiety and depression 11/11/2011   Chicken pox as  a child   Contact dermatitis 06/04/2012   TO NICKLE   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 90 tablet 3   doxycycline (VIBRA-TABS) 100 MG tablet Take 1 tablet (100 mg total) by mouth 2 (two) times daily. 10 tablet 0   Magnesium 250 MG TABS      ondansetron (ZOFRAN) 4 MG tablet Take 1 tablet (4 mg total) by mouth every 8 (eight) hours as needed for nausea or vomiting. (Patient not taking: Reported on 03/14/2023) 20 tablet 5   phentermine 37.5 MG capsule Take 37.5 mg by mouth every morning.     No current facility-administered medications on file prior to visit.     ALLERGIES: Allergies  Allergen Reactions   Cefprozil Other (See Comments)    RASH HEAD TO TOE   Gardasil 9 [Hpv 9-Valent Recomb Vaccine]     Reaction to 2nd injection. Hives. Throat symptoms.    Nickel     Rash with contact   Sulfa Antibiotics     NAUSEA, GI UPSET    FAMILY HISTORY: Family History  Problem Relation Age of Onset   Stroke Father    Heart disease Father    Depression Father    Diabetes Father        type 2   Other Father        muscular spasm of the heart   Hyperlipidemia Father    Heart attack Father    Hyperlipidemia Maternal Grandmother    Hypertension Maternal Grandmother    Diabetes Maternal Grandmother        type 2   Skin cancer Maternal Grandmother    Cancer Maternal Grandfather         laryngeal, alcohol and tobacco   Other Paternal Grandmother        arrythmia   Diabetes Paternal Grandmother        type 2   Pancreatic cancer Paternal Grandmother 62   Diabetes Paternal Grandfather        type 2   Hyperlipidemia Paternal Grandfather    Other Paternal Grandfather        fluid around the heart   Rheum arthritis Paternal Grandfather    Alzheimer's disease Paternal Grandfather       Objective:  *** General: No acute distress.  Patient appears well-groomed.   Head:  Normocephalic/atraumatic Eyes:  Fundi examined but not visualized Neck: supple, right paraspinal tenderness, full range of motion Heart:  Regular rate and rhythm Neurological Exam: ***   Shon Millet, DO  CC: Felix Pacini, DO

## 2023-06-10 ENCOUNTER — Ambulatory Visit: Payer: 59 | Admitting: Family Medicine

## 2023-06-10 ENCOUNTER — Ambulatory Visit (INDEPENDENT_AMBULATORY_CARE_PROVIDER_SITE_OTHER): Payer: 59 | Admitting: Neurology

## 2023-06-10 ENCOUNTER — Encounter: Payer: Self-pay | Admitting: Neurology

## 2023-06-10 VITALS — BP 116/70 | HR 77 | Ht 63.0 in | Wt 226.0 lb

## 2023-06-10 DIAGNOSIS — G43009 Migraine without aura, not intractable, without status migrainosus: Secondary | ICD-10-CM | POA: Diagnosis not present

## 2023-06-10 NOTE — Patient Instructions (Signed)
Continue what you are doing As you have auras with your migraines, I would not take estrogen birth control medications.   If you were to become pregnant, blood thinners are not warranted Follow up as needed.

## 2023-06-11 ENCOUNTER — Ambulatory Visit (INDEPENDENT_AMBULATORY_CARE_PROVIDER_SITE_OTHER): Payer: 59 | Admitting: Family Medicine

## 2023-06-11 VITALS — BP 110/75 | HR 75 | Temp 97.4°F | Wt 225.0 lb

## 2023-06-11 DIAGNOSIS — R1013 Epigastric pain: Secondary | ICD-10-CM

## 2023-06-11 DIAGNOSIS — F418 Other specified anxiety disorders: Secondary | ICD-10-CM | POA: Diagnosis not present

## 2023-06-11 MED ORDER — VENLAFAXINE HCL 37.5 MG PO TABS: 37.5000 mg | ORAL_TABLET | Freq: Two times a day (BID) | ORAL | 0 refills | Status: DC

## 2023-06-11 MED ORDER — SUCRALFATE 1 G PO TABS: 1.0000 g | ORAL_TABLET | Freq: Three times a day (TID) | ORAL | 0 refills | Status: DC

## 2023-06-11 MED ORDER — VENLAFAXINE HCL ER 75 MG PO CP24: 75.0000 mg | ORAL_CAPSULE | Freq: Every day | ORAL | 1 refills | Status: DC

## 2023-06-11 MED ORDER — PANTOPRAZOLE SODIUM 40 MG PO TBEC: 40.0000 mg | DELAYED_RELEASE_TABLET | Freq: Every day | ORAL | 1 refills | Status: DC

## 2023-06-11 NOTE — Progress Notes (Signed)
Robin Suarez , Aug 13, 1996, 27 y.o., female MRN: [id number] Patient Care Team    Relationship Specialty Notifications Start End  Natalia Leatherwood, DO PCP - General Family Medicine  07/20/21   Lewayne Bunting, MD PCP - Cardiology Cardiology  02/13/22   Obgyn, [facility]    01/30/22     Chief Complaint  Patient presents with   side pain    Upper rib cage, beside belly button, and will sometimes radiate to her back. Had a procedure done after a miscarriage in January and feeling has never gone away.      Subjective: Robin Suarez is a 27 y.o. Pt presents for an OV with complaints of upper abdominal pain, mostly right side of 6 months duration.  Associated symptoms include intermittent nausea, constant discomfort in this location for the past 2 weeks.  Occasional sharp pain last a few seconds.  Patient reports that stronger pains can be random or sometimes after meal.  Patient reports she had a miscarriage in January.  Since that time she has noticed symptoms seem to have progressively worsened.  She does endorse increased stress since January that is worsening as well.  She is feeling overwhelmed and reports she is having trouble figuring out how to get things calm down.  She has started seeing a therapist routinely. She has had a cholecystectomy in the past. She reports she had a history of reflux when she was a child and was on Pepcid.  She denies fevers or chills.  She denies night sweats or unintentional weight loss.     06/11/2023    1:11 PM 03/14/2023   10:43 AM 01/15/2022    1:14 PM 07/20/2021    3:11 PM 11/29/2020    1:08 PM  Depression screen PHQ 2/9  Decreased Interest 2 1 0 0 0  Down, Depressed, Hopeless 2 1 0 0 0  PHQ - 2 Score 4 2 0 0 0  Altered sleeping 1 1 1  0 0  Tired, decreased energy 3 1 1 1 1   Change in appetite 1 0 0 0 0  Feeling bad or failure about yourself  2 1 0 0 0  Trouble concentrating 0 0 0 0 0  Moving slowly or fidgety/restless 0 0 0 0 0   Suicidal thoughts 0 0 0 0 0  PHQ-9 Score 11 5 2 1 1   Difficult doing work/chores Somewhat difficult        Allergies  Allergen Reactions   Cefprozil Other (See Comments)    RASH HEAD TO TOE   Gardasil 9 [Hpv 9-Valent Recomb Vaccine]     Reaction to 2nd injection. Hives. Throat symptoms.    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:      -Wears a bicycle helmet riding a bike: Yes     -smoke alarm in the home:Yes     - wears seatbelt: Yes     - Feels safe in their relationships: Yes      Past Medical History:  Diagnosis Date   Anxiety and depression 11/11/2011   Chicken pox as a child   Contact dermatitis 06/04/2012   TO NICKLE   Gallstones    GERD (gastroesophageal reflux disease)    Hayfever  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   Migraine 04/16/2016   Nickel allergy    Obesity 11/11/2011   PT HAS LOST AT LEAST 100 LBS OVER PAST 9 TO 10 MONTHS-NO LONGER OBESE   Overactive bladder    NO LONGER A PROBLEM   PONV (postoperative nausea and vomiting)    NAUSEA AFTER ONE SURGERY AS A CHILD   Social anxiety disorder 11/11/2011   Vesico-ureteral reflux    BLADDER REFLUX AS A CHILD - NO PROBLEM NOW   Past Surgical History:  Procedure Laterality Date   broken arm  2002   left, repair of ulna and radius   CHOLECYSTECTOMY N/A 08/09/2014   Procedure: LAPAROSCOPIC CHOLECYSTECTOMY WITH INTRAOPERATIVE CHOLANGIOGRAM;  Surgeon: Atilano Ina, MD;  Location: [facility];  Service: General;  Laterality: N/A;   Tympanostomy with myringotomy Bilateral 1996   WISDOM TOOTH EXTRACTION  10/23/2012   Family History  Problem Relation Age of Onset   Stroke Father    Heart disease Father    Depression Father    Diabetes Father        type 2   Other Father        muscular spasm of the heart    Hyperlipidemia Father    Heart attack Father    Hyperlipidemia Maternal Grandmother    Hypertension Maternal Grandmother    Diabetes Maternal Grandmother        type 2   Skin cancer Maternal Grandmother    Cancer Maternal Grandfather        laryngeal, alcohol and tobacco   Other Paternal Grandmother        arrythmia   Diabetes Paternal Grandmother        type 2   Pancreatic cancer Paternal Grandmother 64   Diabetes Paternal Grandfather        type 2   Hyperlipidemia Paternal Grandfather    Other Paternal Grandfather        fluid around the heart   Rheum arthritis Paternal Grandfather    Alzheimer's disease Paternal Grandfather    Allergies as of 06/11/2023       Reactions   Cefprozil Other (See Comments)   RASH HEAD TO TOE   Gardasil 9 [hpv 9-valent Recomb Vaccine]    Reaction to 2nd injection. Hives. Throat symptoms.    Nickel    Rash with contact   Sulfa Antibiotics    NAUSEA, GI UPSET        Medication List        Accurate as of June 11, 2023  2:22 PM. If you have any questions, ask your nurse or doctor.          STOP taking these medications    ondansetron 4 MG tablet Commonly known as: Zofran Stopped by: Felix Pacini, DO       TAKE these medications    Cyanocobalamin 1000 MCG Subl 1 tab placed under the tongue daily.   Magnesium 250 MG Tabs   pantoprazole 40 MG tablet Commonly known as: PROTONIX Take 1 tablet (40 mg total) by mouth daily. Started by: Felix Pacini, DO   phentermine 37.5 MG capsule Take 37.5 mg by mouth every morning.   sucralfate 1 g tablet Commonly known as: Carafate Take 1 tablet (1 g total) by mouth 4 (four) times daily -  with meals and at bedtime for 14 days. Started by: Felix Pacini, DO   venlafaxine 37.5 MG tablet Commonly known as: EFFEXOR Take 1 tablet (37.5 mg total) by mouth 2 (two) times  daily. Started by: Felix Pacini, DO   venlafaxine XR 75 MG 24 hr capsule Commonly known as: Effexor XR Take 1 capsule  (75 mg total) by mouth daily with breakfast. Start taking on: June 20, 2023 Started by: Felix Pacini, DO        All past medical history, surgical history, allergies, family history, immunizations andmedications were updated in the EMR today and reviewed under the history and medication portions of their EMR.     ROS Negative, with the exception of above mentioned in HPI   Objective:  BP 110/75   Pulse 75   Temp (!) 97.4 F (36.3 C)   Wt 225 lb (102.1 kg)   LMP 05/11/2023   SpO2 100%   BMI 39.86 kg/m  Body mass index is 39.86 kg/m. Physical Exam Vitals and nursing note reviewed.  Constitutional:      General: She is not in acute distress.    Appearance: Normal appearance. She is normal weight. She is not ill-appearing or toxic-appearing.  HENT:     Head: Normocephalic and atraumatic.  Eyes:     General: No scleral icterus.       Right eye: No discharge.        Left eye: No discharge.     Extraocular Movements: Extraocular movements intact.     Conjunctiva/sclera: Conjunctivae normal.     Pupils: Pupils are equal, round, and reactive to light.  Abdominal:     General: Bowel sounds are normal. There is no distension.     Palpations: Abdomen is soft. There is no mass.     Tenderness: There is abdominal tenderness (Epigastric). There is no guarding or rebound.  Skin:    Findings: No rash.  Neurological:     Mental Status: She is alert and oriented to person, place, and time. Mental status is at baseline.     Motor: No weakness.     Coordination: Coordination normal.     Gait: Gait normal.  Psychiatric:        Mood and Affect: Mood is anxious. Affect is tearful.        Speech: Speech normal.        Behavior: Behavior normal. Behavior is cooperative.        Thought Content: Thought content normal.        Cognition and Memory: Cognition normal.        Judgment: Judgment normal.     No results found. No results found. No results found for this or any previous  visit (from the past 24 hour(s)).  Assessment/Plan: Robin Suarez is a 27 y.o. female present for OV for  Epigastric pain Suspect symptoms are GI related, possibly secondary to increased stress level. She has a history of reflux when she was a kid. Start Protonix 40 mg daily for 90 days then can try every other day Start Carafate Suarez to meals and before bed for 2 weeks. GERD diet discussed  Depression with anxiety Patient seems to be under a great deal of anxiety and feeling overwhelmed.  She is tearful today. We discussed different types of medicine, ultimately would like to stick with a weight neutral medication. Start Effexor taper to 75 mg daily Follow-up in 6 weeks, can be virtual  Reviewed expectations re: course of current medical issues. Discussed self-management of symptoms. Outlined signs and symptoms indicating need for more acute intervention. Patient verbalized understanding and all questions were answered. Patient received an After-Visit Summary.    No orders of  the defined types were placed in this encounter.  Meds ordered this encounter  Medications   venlafaxine (EFFEXOR) 37.5 MG tablet    Sig: Take 1 tablet (37.5 mg total) by mouth 2 (two) times daily.    Dispense:  14 tablet    Refill:  0   venlafaxine XR (EFFEXOR XR) 75 MG 24 hr capsule    Sig: Take 1 capsule (75 mg total) by mouth daily with breakfast.    Dispense:  90 capsule    Refill:  1   pantoprazole (PROTONIX) 40 MG tablet    Sig: Take 1 tablet (40 mg total) by mouth daily.    Dispense:  90 tablet    Refill:  1   sucralfate (CARAFATE) 1 g tablet    Sig: Take 1 tablet (1 g total) by mouth 4 (four) times daily -  with meals and at bedtime for 14 days.    Dispense:  60 tablet    Refill:  0   Referral Orders  No referral(s) requested today     Note is dictated utilizing voice recognition software. Although note has been proof read Suarez to signing, occasional typographical errors still  can be missed. If any questions arise, please do not hesitate to call for verification.   electronically signed by:  Felix Pacini, DO  [facility] - [place]

## 2023-06-11 NOTE — Patient Instructions (Addendum)
No follow-ups on file.   Carafate 2 weeks Protonix is 3 mos  GERD diet Effexor 37.5 daily for 2 weeks, then 75 mg daily       Great to see you today.  I have refilled the medication(s) we provide.   If labs were collected, we will inform you of lab results once received either by echart message or telephone call.   - echart message- for normal results that have been seen by the patient already.   - telephone call: abnormal results or if patient has not viewed results in their echart.

## 2023-06-12 ENCOUNTER — Encounter: Payer: Self-pay | Admitting: Family Medicine

## 2023-06-15 NOTE — Telephone Encounter (Signed)
Please inform patient I have been out of the office.  If the medication made her that sick, then do not continue. We would need to discuss other alternatives though, please schedule her for virtual visit to discuss.

## 2023-07-23 ENCOUNTER — Ambulatory Visit: Payer: 59 | Admitting: Family Medicine

## 2023-08-15 LAB — OB RESULTS CONSOLE RPR: RPR: NONREACTIVE

## 2023-08-15 LAB — OB RESULTS CONSOLE GC/CHLAMYDIA
Chlamydia: NEGATIVE
Neisseria Gonorrhea: NEGATIVE

## 2023-08-15 LAB — OB RESULTS CONSOLE RUBELLA ANTIBODY, IGM: Rubella: IMMUNE

## 2023-08-15 LAB — HEPATITIS C ANTIBODY: HCV Ab: NEGATIVE

## 2023-08-15 LAB — OB RESULTS CONSOLE HEPATITIS B SURFACE ANTIGEN: Hepatitis B Surface Ag: NEGATIVE

## 2023-08-15 LAB — OB RESULTS CONSOLE ANTIBODY SCREEN: Antibody Screen: NEGATIVE

## 2023-08-15 LAB — OB RESULTS CONSOLE HIV ANTIBODY (ROUTINE TESTING): HIV: NONREACTIVE

## 2023-09-18 ENCOUNTER — Other Ambulatory Visit: Payer: Self-pay | Admitting: Neurology

## 2023-10-10 ENCOUNTER — Ambulatory Visit (INDEPENDENT_AMBULATORY_CARE_PROVIDER_SITE_OTHER): Payer: 59 | Admitting: Family Medicine

## 2023-10-10 ENCOUNTER — Encounter: Payer: Self-pay | Admitting: Family Medicine

## 2023-10-10 VITALS — BP 128/82 | HR 88 | Temp 98.9°F | Wt 248.2 lb

## 2023-10-10 DIAGNOSIS — R0981 Nasal congestion: Secondary | ICD-10-CM

## 2023-10-10 DIAGNOSIS — J329 Chronic sinusitis, unspecified: Secondary | ICD-10-CM | POA: Diagnosis not present

## 2023-10-10 DIAGNOSIS — Z3490 Encounter for supervision of normal pregnancy, unspecified, unspecified trimester: Secondary | ICD-10-CM | POA: Diagnosis not present

## 2023-10-10 DIAGNOSIS — B9689 Other specified bacterial agents as the cause of diseases classified elsewhere: Secondary | ICD-10-CM | POA: Diagnosis not present

## 2023-10-10 LAB — POC COVID19 BINAXNOW: SARS Coronavirus 2 Ag: NEGATIVE

## 2023-10-10 MED ORDER — AMOXICILLIN-POT CLAVULANATE 875-125 MG PO TABS: 1.0000 | ORAL_TABLET | Freq: Two times a day (BID) | ORAL | 0 refills | Status: DC

## 2023-10-10 NOTE — Patient Instructions (Addendum)

## 2023-10-10 NOTE — Progress Notes (Signed)
Robin Suarez , [birth year], 27 y.o., female MRN: [id number] Patient Care Team    Relationship Specialty Notifications Start End  Natalia Leatherwood, DO PCP - General Family Medicine  07/20/21   Lewayne Bunting, MD PCP - Cardiology Cardiology  02/13/22   Obgyn, Ma [facility]    01/30/22     Chief Complaint  Patient presents with   Nasal Congestion    Sinus pressure for 3-4 days; neg covid; [redacted] wk gestation; had flu shot on 10/15     Subjective: Robin Suarez is a 27 y.o. Pt presents for an OV with complaints of sinus pressure worsening  of 4 days duration. She states she had a head cold  last week, that almost resolved by sinus pressure remained and worsening. Associated symptoms include recent flu shot 3 days ago. Pt is [redacted] wk pregnant.  Pt has tried zyrtec to ease their symptoms.      10/10/2023    8:41 AM 06/11/2023    1:11 PM 03/14/2023   10:43 AM 01/15/2022    1:14 PM 07/20/2021    3:11 PM  Depression screen PHQ 2/9  Decreased Interest 1 2 1  0 0  Down, Depressed, Hopeless 1 2 1  0 0  PHQ - 2 Score 2 4 2  0 0  Altered sleeping 0 1 1 1  0  Tired, decreased energy 1 3 1 1 1   Change in appetite 0 1 0 0 0  Feeling bad or failure about yourself  1 2 1  0 0  Trouble concentrating 0 0 0 0 0  Moving slowly or fidgety/restless 0 0 0 0 0  Suicidal thoughts 0 0 0 0 0  PHQ-9 Score 4 11 5 2 1   Difficult doing work/chores Not difficult at all Somewhat difficult       Allergies  Allergen Reactions   Cefprozil Other (See Comments)    RASH HEAD TO TOE   Gardasil 9 [Human Papillomavirus 9-Valent Recombinant Vaccine]     Reaction to 2nd injection. Hives. Throat symptoms.    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   Migraine 04/16/2016   Nickel allergy    Obesity 11/11/2011   PT HAS LOST AT LEAST 100 LBS OVER PAST 9 TO 10 MONTHS-NO LONGER OBESE   Overactive bladder    NO LONGER A PROBLEM   PONV (postoperative nausea and vomiting)    NAUSEA AFTER ONE SURGERY AS A CHILD   Seasonal allergies 07/23/2021   Social anxiety disorder 11/11/2011   Vesico-ureteral reflux    BLADDER REFLUX AS A CHILD - NO PROBLEM NOW  Past Surgical History:  Procedure Laterality Date   broken arm  2002   left, repair of ulna and radius   CHOLECYSTECTOMY N/A 08/09/2014   Procedure: LAPAROSCOPIC CHOLECYSTECTOMY WITH INTRAOPERATIVE CHOLANGIOGRAM;  Surgeon: Atilano Ina, MD;  Location: [facility];  Service: General;  Laterality: N/A;   Tympanostomy with myringotomy Bilateral 1996   WISDOM TOOTH EXTRACTION  10/23/2012   Family History  Problem Relation Age of Onset   Stroke Father    Heart disease Father    Depression Father    Diabetes Father        type 2   Other Father        muscular spasm of the heart   Hyperlipidemia Father    Heart attack Father    Hyperlipidemia Maternal Grandmother    Hypertension Maternal Grandmother    Diabetes Maternal Grandmother        type 2   Skin cancer Maternal Grandmother    Cancer Maternal Grandfather        laryngeal, alcohol and tobacco   Other Paternal Grandmother        arrythmia   Diabetes Paternal Grandmother        type 2   Pancreatic cancer Paternal Grandmother 48   Diabetes Paternal Grandfather        type 2   Hyperlipidemia  Paternal Grandfather    Other Paternal Grandfather        fluid around the heart   Rheum arthritis Paternal Grandfather    Alzheimer's disease Paternal Grandfather    Allergies as of 10/10/2023       Reactions   Cefprozil Other (See Comments)   RASH HEAD TO TOE   Gardasil 9 [human Papillomavirus 9-valent Recombinant Vaccine]    Reaction to 2nd injection. Hives. Throat symptoms.    Nickel    Rash with contact   Sulfa Antibiotics    NAUSEA, GI UPSET        Medication List        Accurate as of October 10, 2023  9:42 AM. If you have any questions, ask your nurse or doctor.          STOP taking these medications    Magnesium 250 MG Tabs Stopped by: Felix Pacini   phentermine 37.5 MG capsule Stopped by: Felix Pacini   sucralfate 1 g tablet Commonly known as: Carafate Stopped by: Felix Pacini   venlafaxine 37.5 MG tablet Commonly known as: EFFEXOR Stopped by: Felix Pacini   venlafaxine XR 75 MG 24 hr capsule Commonly known as: Effexor XR Stopped by: Felix Pacini       TAKE these medications    amoxicillin-clavulanate 875-125 MG tablet Commonly known as: AUGMENTIN Take 1 tablet by mouth 2 (two) times daily. Started by: Felix Pacini   aspirin 81 MG chewable tablet Chew by mouth daily.   Cyanocobalamin 1000 MCG Subl 1 tab placed under the tongue daily.   pantoprazole 40 MG tablet Commonly known as: PROTONIX Take 1 tablet (40 mg total) by mouth daily.        All past medical history, surgical history, allergies, family history, immunizations andmedications were updated in the EMR today and reviewed under the history and medication portions of their EMR.     Review of Systems  Constitutional:  Negative for chills and fever.  HENT:  Positive for congestion and sinus pain.   Eyes:  Negative for discharge and redness.  Respiratory:  Negative for cough and shortness of breath.   Gastrointestinal:  Negative for diarrhea, nausea and vomiting.   Musculoskeletal:  Negative for myalgias.  Skin:  Negative for rash.  Neurological:  Positive for headaches. Negative for dizziness.   Negative, with the exception of above mentioned in HPI   Objective:  BP 128/82   Pulse 88   Temp 98.9 F (37.2 C)   Wt 248 lb 3.2 oz (112.6 kg)   LMP 05/11/2023   SpO2 99%   BMI 43.97 kg/m  Body mass index is 43.97 kg/m. Physical Exam Vitals and nursing note reviewed.  Constitutional:      General: She is not in acute distress.    Appearance: Normal appearance. She is normal weight. She is not ill-appearing or toxic-appearing.  HENT:     Head: Normocephalic and atraumatic.     Right Ear: Tympanic membrane, ear canal and external ear normal.     Left Ear: Tympanic membrane, ear canal and external ear normal.     Nose: Congestion present. No rhinorrhea.     Mouth/Throat:     Pharynx: No oropharyngeal exudate or posterior oropharyngeal erythema.  Eyes:     General: No scleral icterus.       Right eye: No discharge.        Left eye: No discharge.     Extraocular Movements: Extraocular movements intact.     Conjunctiva/sclera: Conjunctivae normal.     Pupils: Pupils are equal, round, and reactive to light.  Cardiovascular:     Rate and Rhythm: Normal rate and regular rhythm.  Pulmonary:     Effort: Pulmonary effort is normal. No respiratory distress.     Breath sounds: Normal breath sounds. No wheezing, rhonchi or rales.  Musculoskeletal:     Right lower leg: No edema.     Left lower leg: No edema.  Skin:    Findings: No rash.  Neurological:     Mental Status: She is alert and oriented to person, place, and time. Mental status is at baseline.     Motor: No weakness.     Coordination: Coordination normal.     Gait: Gait normal.  Psychiatric:        Mood and Affect: Mood normal.        Behavior: Behavior normal.        Thought Content: Thought content normal.        Judgment: Judgment normal.    No results found. No results  found. Results for orders placed or performed in visit on 10/10/23 (from the past 24 hour(s))  POC COVID-19 BinaxNow     Status: None   Collection Time: 10/10/23  9:03 AM  Result Value Ref Range   SARS Coronavirus 2 Ag Negative Negative    Assessment/Plan: Chatara E Seaberg is a 27 y.o. female present for OV for  Bacterial sinusitis/Pregnancy, unspecified gestational age Nasal congestion - POC COVID-19 BinaxNow Rest, hydrate. Hydrate. hydrate nettie pot or nasal saline.  Augmentin printed, take until completed, if started. Pt understands not to start abx unless symptoms do not improve or worsen over the next week.  F/U 2 weeks if not improved.   Reviewed expectations re: course of current medical issues. Discussed self-management of symptoms. Outlined signs and symptoms indicating need for more acute intervention. Patient verbalized understanding and all questions were answered. Patient received an After-Visit Summary.    Orders Placed This Encounter  Procedures   POC COVID-19 BinaxNow   Meds ordered this encounter  Medications   amoxicillin-clavulanate (AUGMENTIN) 875-125 MG tablet  Sig: Take 1 tablet by mouth 2 (two) times daily.    Dispense:  20 tablet    Refill:  0   Referral Orders  No referral(s) requested today    Note is dictated utilizing voice recognition software. Although note has been proof read prior to signing, occasional typographical errors still can be missed. If any questions arise, please do not hesitate to call for verification.   electronically signed by:  Felix Pacini, DO  [facility] - OR

## 2023-12-10 ENCOUNTER — Other Ambulatory Visit: Payer: Self-pay

## 2023-12-10 ENCOUNTER — Ambulatory Visit: Payer: 59 | Attending: Obstetrics | Admitting: Physical Therapy

## 2023-12-10 DIAGNOSIS — M79652 Pain in left thigh: Secondary | ICD-10-CM | POA: Diagnosis present

## 2023-12-10 DIAGNOSIS — R102 Pelvic and perineal pain: Secondary | ICD-10-CM | POA: Diagnosis present

## 2023-12-10 DIAGNOSIS — M5459 Other low back pain: Secondary | ICD-10-CM | POA: Insufficient documentation

## 2023-12-10 DIAGNOSIS — O26892 Other specified pregnancy related conditions, second trimester: Secondary | ICD-10-CM | POA: Insufficient documentation

## 2023-12-10 NOTE — Therapy (Signed)
OUTPATIENT PHYSICAL THERAPY THORACOLUMBAR EVALUATION   Patient Name: Robin Suarez MRN: [id number] DOB:01-22-1996, 27 y.o., female Today's Date: 12/10/2023  END OF SESSION:  PT End of Session - 12/10/23 0901     Visit Number 1    Date for PT Re-Evaluation 03/03/24    Authorization Type [employer]    PT Start Time 0850    PT Stop Time 0930    PT Time Calculation (min) 40 min    Activity Tolerance Patient tolerated treatment well             Past Medical History:  Diagnosis Date   Anxiety and depression 11/11/2011   Chicken pox as a child   Contact dermatitis 06/04/2012   TO NICKLE   Depression with anxiety 11/11/2011   Bipolar disorder screen is negative but borderline.     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   Migraine 04/16/2016   Nickel allergy    Obesity 11/11/2011   PT HAS LOST AT LEAST 100 LBS OVER PAST 9 TO 10 MONTHS-NO LONGER OBESE   Overactive bladder    NO LONGER A PROBLEM   PONV (postoperative nausea and vomiting)    NAUSEA AFTER ONE SURGERY AS A CHILD   Seasonal allergies 07/23/2021   Social anxiety disorder 11/11/2011   Vesico-ureteral reflux    BLADDER REFLUX AS A CHILD - NO PROBLEM NOW   Past Surgical History:  Procedure Laterality Date   broken arm  2002   left, repair of ulna and radius   CHOLECYSTECTOMY N/A 08/09/2014   Procedure: LAPAROSCOPIC CHOLECYSTECTOMY WITH INTRAOPERATIVE CHOLANGIOGRAM;  Surgeon: Atilano Ina, MD;  Location: [facility];  Service: General;  Laterality: N/A;   Tympanostomy with myringotomy Bilateral 1996   WISDOM TOOTH EXTRACTION  10/23/2012   Patient Active Problem List   Diagnosis Date Noted   Vitamin B12 deficiency 07/23/2021   Reactive airway disease 07/23/2021   Migraine 04/16/2016   Hyperhidrosis 11/11/2011   Overactive bladder    Vesico-ureteral reflux     PCP: Felix Pacini DO  REFERRING PROVIDER: Noland Fordyce MD  REFERRING  DIAG: R10.2 pelvic pain  Rationale for Evaluation and Treatment: Rehabilitation  THERAPY DIAG:  Back pain; weakness  ONSET DATE: last few months  SUBJECTIVE:                                                                                                                                                                                           SUBJECTIVE STATEMENT: [redacted] weeks pregnant with new onset of left buttock/hip pain radiating to pubic region  and left inner thigh.  Toes tingle.  Similar symptoms on right as well just not as intense.  Just bought maternity support but no benefit.  Rides bike and does TM, next day soreness PERTINENT HISTORY:  Right Thoracic Outlet syndrome Migraines Rides horses Has KT at home but hasn't tried [redacted] weeks pregnant with boy measuring 98%:  Due date March 26th  PAIN:   Are you having pain? Yes NPRS scale: 5/10 Pain location: left > right posterior hip, inner thigh to pubic region Pain orientation: Right and Left  PAIN TYPE: throbbing Pain description: tingling  Aggravating factors: lying to standing; sitting; pinch with rotating left leg to put on socks/shoes; sit to stand Relieving factors: moving   PRECAUTIONS: Other: pregnancy    WEIGHT BEARING RESTRICTIONS: No  FALLS:  Has patient fallen in last 6 months? No  LIVING ENVIRONMENT: Lives with: lives with their spouse Lives in: House/apartment   OCCUPATION: self employed  PLOF: Independent  PATIENT GOALS: symptom relief for remainder of pregnancy   OBJECTIVE:  Note: Objective measures were completed at Evaluation unless otherwise noted.  DIAGNOSTIC FINDINGS:  None  PATIENT SURVEYS:  Modified Oswestry 46% moderate disability   COGNITION: Overall cognitive status: Within functional limits for tasks assessed      POSTURE: No Significant postural limitations   LUMBAR ROM:   AROM eval  Flexion 25% limited  Extension WFLs  Right lateral flexion WFLs  Left lateral  flexion WFLs  Right rotation   Left rotation    (Blank rows = not tested) TRUNK STRENGTH:  Decreased activation of transverse abdominus muscles; abdominals 4-/5; decreased activation of lumbar multifidi; trunk extensors 4-/5  LOWER EXTREMITY ROM:   decreased left external rotation 35 degrees  LOWER EXTREMITY MMT:  grossly WFLs except hip abduction and hip flexion left 4/5;  difficulty stabilizing on left with SLS  FUNCTIONAL TESTS:  Able to rise from standard height chair without UE assist;  able to squat to pick up small object from the floor  GAIT: Comments: decreased gait speed  TODAY'S TREATMENT:                                                                                                                              DATE: 12/18  Initial HEP   PATIENT EDUCATION:  Education details: Educated patient on anatomy and physiology of current symptoms, prognosis, plan of care as well as initial self care strategies to promote recovery Person educated: Patient Education method: Explanation Education comprehension: verbalized understanding  HOME EXERCISE PROGRAM: Access Code: [email or web address] URL: [email or web address] Date: 12/10/2023 Prepared by: Lavinia Sharps  Exercises - Seated Hip Flexor Stretch (Mirrored)  - 1 x daily - 7 x weekly - 1 sets - 3 reps - 20 hold - Seated Hip Flexor Stretch on Swiss Ball (Mirrored)  - 1 x daily - 7 x weekly - 1 sets - 3 reps - 20 hold - Hip Recruitment consultant with Chair (Mirrored)  -  1 x daily - 7 x weekly - 1 sets - 10 reps - Standing Lumbar Extension  - 1 x daily - 7 x weekly - 3 sets - 10 reps - Quadruped Cat Cow  - 1 x daily - 7 x weekly - 3 sets - 10 reps - Quadruped Rocking Slow  - 1 x daily - 7 x weekly - 1 sets - 10 reps - Seated Hip Adductor Stretch on Swiss Ball  - 1 x daily - 7 x weekly - 1 sets - 10 reps - Pelvic Tilt on Swiss Ball  - 1 x daily - 7 x weekly - 1 sets - 10 reps  ASSESSMENT:  CLINICAL IMPRESSION: Patient is a 27  y.o. female who was seen today for physical therapy evaluation and treatment for left > right posterior hip to pubic area and inner thigh as well as some tingling in toes.  Symptoms are aggravated with going from lying to standing, rising and with externally rotating hip to put on socks and shoes.  Decreased activation of lumbopelvic/core and hip muscles particularly on the left.  She responds well to "opening" movements of the anterior hip/pubic area and pelvic mobility.  She would benefit from skilled PT to address these impairments.    OBJECTIVE IMPAIRMENTS: decreased activity tolerance, decreased strength, impaired perceived functional ability, and pain.   ACTIVITY LIMITATIONS: sitting, standing, dressing, and locomotion level  PARTICIPATION LIMITATIONS: meal prep, cleaning, laundry, and community activity  PERSONAL FACTORS:  no previous history of lumbar/hip issues positively   affecting patient's functional outcome.   REHAB POTENTIAL: Good  CLINICAL DECISION MAKING: Stable/uncomplicated  EVALUATION COMPLEXITY: Low   GOALS: Goals reviewed with patient? Yes  SHORT TERM GOALS: Target date: 01/21/2024   The patient will demonstrate knowledge of basic self care strategies and exercises to promote healing  Baseline: Goal status: INITIAL  2.  The patient will report a 25% improvement in pain levels with functional activities which are currently difficult including moving from lying to standing/sit to stand Baseline:  Goal status: INITIAL  3.  The patient will be able to perform light to medium intensity exercise 3x/week with pain level <5/10 Baseline:  Goal status: INITIAL    LONG TERM GOALS: Target date: 03/03/2024   The patient will be independent in a safe self progression of a home exercise program to promote further recovery of function  Baseline:  Goal status: INITIAL  2.  The patient will report a 50% improvement in pain levels with functional activities which are  currently difficult including moving from lying to standing/sit to stand Baseline:  Goal status: INITIAL  3.  The patient will have improved trunk flexor and extensor muscle strength to at least 4+/5 needed for lifting her infant and babygear Baseline:  Goal status: INITIAL  4.  Modified Oswestry score improved to 35% indicating improved function with less pain Baseline:  Goal status: INITIAL    PLAN:  PT FREQUENCY: 1x/week  PT DURATION: 12 weeks  PLANNED INTERVENTIONS: 97164- PT Re-evaluation, 97110-Therapeutic exercises, 97530- Therapeutic activity, [id number]- Neuromuscular re-education, 97535- Self Care, 32202- Manual therapy, [id number]- Aquatic Therapy, 97014- Electrical stimulation (unattended), [id number]- Electrical stimulation (manual), Patient/Family education, Taping, Dry Needling, Spinal mobilization, Cryotherapy, and Moist heat.  PLAN FOR NEXT SESSION: try 1/2 kneel hip flexor stretch; standing extension; open books; KT tape; no supine;   Lavinia Sharps, PT 12/10/23 9:13 PM Phone: [phone] Fax: [phone]

## 2023-12-12 ENCOUNTER — Encounter (HOSPITAL_COMMUNITY): Payer: Self-pay | Admitting: Obstetrics and Gynecology

## 2023-12-12 ENCOUNTER — Inpatient Hospital Stay (HOSPITAL_COMMUNITY)
Admission: AD | Admit: 2023-12-12 | Discharge: 2023-12-13 | Disposition: A | Discharge status: Home / Self Care | Payer: 59 | Attending: Obstetrics and Gynecology | Admitting: Obstetrics and Gynecology

## 2023-12-12 DIAGNOSIS — Z3A26 26 weeks gestation of pregnancy: Secondary | ICD-10-CM | POA: Insufficient documentation

## 2023-12-12 DIAGNOSIS — B9689 Other specified bacterial agents as the cause of diseases classified elsewhere: Secondary | ICD-10-CM | POA: Insufficient documentation

## 2023-12-12 DIAGNOSIS — N760 Acute vaginitis: Secondary | ICD-10-CM | POA: Diagnosis not present

## 2023-12-12 DIAGNOSIS — O23592 Infection of other part of genital tract in pregnancy, second trimester: Secondary | ICD-10-CM | POA: Insufficient documentation

## 2023-12-12 DIAGNOSIS — O26852 Spotting complicating pregnancy, second trimester: Secondary | ICD-10-CM | POA: Diagnosis present

## 2023-12-12 DIAGNOSIS — O26892 Other specified pregnancy related conditions, second trimester: Secondary | ICD-10-CM | POA: Diagnosis not present

## 2023-12-12 DIAGNOSIS — Z3689 Encounter for other specified antenatal screening: Secondary | ICD-10-CM | POA: Insufficient documentation

## 2023-12-12 LAB — URINALYSIS, ROUTINE W REFLEX MICROSCOPIC
Bilirubin Urine: NEGATIVE
Glucose, UA: NEGATIVE mg/dL
Ketones, ur: NEGATIVE mg/dL
Leukocytes,Ua: NEGATIVE
Nitrite: NEGATIVE
Protein, ur: NEGATIVE mg/dL
Specific Gravity, Urine: 1.02 (ref 1.005–1.030)
pH: 6 (ref 5.0–8.0)

## 2023-12-12 LAB — URINALYSIS, MICROSCOPIC (REFLEX)

## 2023-12-12 NOTE — MAU Note (Addendum)
Pt says she was in office 2 weeks ago- collected UA- Dx with UTI -given antx- took macrobid  for total of   3 tabs Wed and Thurs am   - made her vomit - so she stopped - called them yesterday - told them of vomiting . So went to office today- and gave  cath specimen.  At restaurant - saw blood on toilet paper at 730pm.- so came here .  In Triage -Has spot on underwear - not blood .

## 2023-12-12 NOTE — MAU Provider Note (Signed)
History     CSN: [id number]  Arrival date and time: 12/12/23 4098   Event Date/Time   First Provider Initiated Contact with Patient 12/12/23 2345      Chief Complaint  Patient presents with   UTI   Robin Suarez is a 27 y.o. G2P0010 at [redacted]w[redacted]d who receives care at [facility].  Patient reports next appt is Dec 21st.   She presents today for vaginal bleeding.  She reports she had an in/out catheter x 2 earlier today for a UA specimen.  She states she noted some blood with wiping and then noted blood in her underwear like she started her period.  She states this occurred around 630-7pm.  She reports some mild intermittent cramping, but denies contractions.  She states she has had some ongoing pelvic and hip discomfort, but is in PT.  Patient endorses fetal movement.    OB History     Gravida  2   Para  0   Term  0   Preterm  0   AB  0   Living  0      SAB  0   IAB  0   Ectopic  0   Multiple  0   Live Births  0           Past Medical History:  Diagnosis Date   Anxiety and depression 11/11/2011   Chicken pox as a child   Contact dermatitis 06/04/2012   TO NICKLE   Depression with anxiety 11/11/2011   Bipolar disorder screen is negative but borderline.     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   Migraine 04/16/2016   Nickel allergy    Obesity 11/11/2011   PT HAS LOST AT LEAST 100 LBS OVER PAST 9 TO 10 MONTHS-NO LONGER OBESE   Overactive bladder    NO LONGER A PROBLEM   PONV (postoperative nausea and vomiting)    NAUSEA AFTER ONE SURGERY AS A CHILD   Seasonal allergies 07/23/2021   Social anxiety disorder 11/11/2011   Vesico-ureteral reflux    BLADDER REFLUX AS A CHILD - NO PROBLEM NOW    Past Surgical History:  Procedure Laterality Date   broken arm  2002   left, repair of ulna and radius   CHOLECYSTECTOMY N/A 08/09/2014   Procedure: LAPAROSCOPIC CHOLECYSTECTOMY  WITH INTRAOPERATIVE CHOLANGIOGRAM;  Surgeon: Atilano Ina, MD;  Location: [facility];  Service: General;  Laterality: N/A;   Tympanostomy with myringotomy Bilateral 1996   WISDOM TOOTH EXTRACTION  10/23/2012    Family History  Problem Relation Age of Onset   Stroke Father    Heart disease Father    Depression Father    Diabetes Father        type 2   Other Father        muscular spasm of the heart   Hyperlipidemia Father    Heart attack Father    Hyperlipidemia Maternal Grandmother    Hypertension Maternal Grandmother    Diabetes Maternal Grandmother        type 2   Skin cancer Maternal Grandmother    Cancer Maternal Grandfather        laryngeal, alcohol and tobacco   Other Paternal Grandmother        arrythmia   Diabetes Paternal Grandmother        type 2   Pancreatic cancer Paternal Grandmother 22   Diabetes Paternal Grandfather  type 2   Hyperlipidemia Paternal Grandfather    Other Paternal Grandfather        fluid around the heart   Rheum arthritis Paternal Grandfather    Alzheimer's disease Paternal Grandfather     Social History   Tobacco Use   Smoking status: Never    Passive exposure: Never   Smokeless tobacco: Never  Vaping Use   Vaping status: Never Used  Substance Use Topics   Alcohol use: Yes    Comment: occ   Drug use: No    Allergies:  Allergies  Allergen Reactions   Cefprozil Other (See Comments)    RASH HEAD TO TOE   Gardasil 9 [Human Papillomavirus 9-Valent Recombinant Vaccine]     Reaction to 2nd injection. Hives. Throat symptoms.    Macrobid [Nitrofurantoin] Nausea And Vomiting   Nickel     Rash with contact   Sulfa Antibiotics     NAUSEA, GI UPSET    Medications Prior to Admission  Medication Sig Dispense Refill Last Dose/Taking   aspirin 81 MG chewable tablet Chew by mouth daily.   12/11/2023 Evening   pantoprazole (PROTONIX) 40 MG tablet Take 1 tablet (40 mg total) by mouth daily. 90 tablet 1 12/11/2023 Evening   Prenatal  Vit-Fe Fumarate-FA (MULTIVITAMIN-PRENATAL) 27-0.8 MG TABS tablet Take 1 tablet by mouth daily at 12 noon.   12/12/2023 Morning   amoxicillin-clavulanate (AUGMENTIN) 875-125 MG tablet Take 1 tablet by mouth 2 (two) times daily. 20 tablet 0    Cyanocobalamin 1000 MCG SUBL 1 tab placed under the tongue daily. (Patient not taking: Reported on 12/10/2023) 90 tablet 3     Review of Systems  Eyes:  Negative for visual disturbance.  Gastrointestinal:  Positive for vomiting (Last night but thinks d/t Macrobid). Negative for nausea.  Genitourinary:  Positive for pelvic pain and vaginal bleeding. Negative for difficulty urinating, dysuria and vaginal discharge (Yeast infection x 3.  Last one couple weeks ago.).  Neurological:  Negative for headaches.   Physical Exam   Blood pressure 127/69, pulse 88, temperature 99.2 F (37.3 C), temperature source Oral, resp. rate 16, height 5\' 2"  (1.575 m), weight 124.4 kg, last menstrual period 05/11/2023, unknown if currently breastfeeding.  Physical Exam Vitals and nursing note reviewed. Exam conducted with a chaperone present Warnell Forester, NT).  Constitutional:      Appearance: Normal appearance.  HENT:     Head: Normocephalic and atraumatic.  Eyes:     Conjunctiva/sclera: Conjunctivae normal.  Cardiovascular:     Rate and Rhythm: Normal rate.  Pulmonary:     Breath sounds: Normal breath sounds.  Abdominal:     General: Bowel sounds are normal.  Genitourinary:    General: Normal vulva.     Comments: Speculum Exam: -Normal External Genitalia: Non tender, small amt white discharge at introitus.  -Vaginal Vault: Pink mucosa with good rugae. Small amt whitish yellow discharge -wet prep collected -Cervix:Pink, no lesions, cysts, or polyps.  Appears closed. No active bleeding from os -Bimanual Exam: Musculoskeletal:        General: Normal range of motion.     Cervical back: Normal range of motion.  Skin:    General: Skin is warm and dry.  Neurological:      Mental Status: She is alert and oriented to person, place, and time.  Psychiatric:        Mood and Affect: Mood normal.        Behavior: Behavior normal.     Fetal Assessment 145 bpm,  Mod Var, -Decels, +15x15 Accels Toco: No ctx graphed  MAU Course   Results for orders placed or performed during the hospital encounter of 12/12/23 (from the past 24 hours)  Urinalysis, Routine w reflex microscopic -Urine, Clean Catch     Status: Abnormal   Collection Time: 12/12/23  8:29 PM  Result Value Ref Range   Color, Urine YELLOW YELLOW   APPearance HAZY (A) CLEAR   Specific Gravity, Urine 1.020 1.005 - 1.030   pH 6.0 5.0 - 8.0   Glucose, UA NEGATIVE NEGATIVE mg/dL   Hgb urine dipstick MODERATE (A) NEGATIVE   Bilirubin Urine NEGATIVE NEGATIVE   Ketones, ur NEGATIVE NEGATIVE mg/dL   Protein, ur NEGATIVE NEGATIVE mg/dL   Nitrite NEGATIVE NEGATIVE   Leukocytes,Ua NEGATIVE NEGATIVE  Urinalysis, Microscopic (reflex)     Status: Abnormal   Collection Time: 12/12/23  8:29 PM  Result Value Ref Range   RBC / HPF 11-20 0 - 5 RBC/hpf   WBC, UA 0-5 0 - 5 WBC/hpf   Bacteria, UA RARE (A) NONE SEEN   Squamous Epithelial / HPF 6-10 0 - 5 /HPF  Wet prep, genital     Status: Abnormal   Collection Time: 12/13/23 12:07 AM  Result Value Ref Range   Yeast Wet Prep HPF POC NONE SEEN NONE SEEN   Trich, Wet Prep NONE SEEN NONE SEEN   Clue Cells Wet Prep HPF POC PRESENT (A) NONE SEEN   WBC, Wet Prep HPF POC >=10 (A) <10   Sperm NONE SEEN    No results found.  MDM PE Labs: UA Wet Prep EFM Assessment and Plan  27 year old G2P0  SIUP at 26.3 weeks Cat I FT Vaginal vs Urethral Bleeding  -POC Reviewed -Exam performed and findings discussed. -Informed that no old blood noted on exam. Discussed how blood was likely from urethra especially with recent catheter x 2.  -Wet prep collected and will send. -NST reactive. -Monitor and reassess.    Cherre Robins MSN, CNM 12/12/2023, 11:45 PM    Reassessment (12:32 AM) -Results as above. -Provider reviewed with patient. -Addressed questions. -Reviewed treatment options. Patient opts for oral medication.  -Rx for metronidazole sent to pharmacy on file.  -Precautions reviewed. -Encouraged to call primary office or return to MAU if symptoms worsen or with the onset of new symptoms. -Discharged to home in stable condition.  Cherre Robins MSN, CNM Advanced Practice Provider, [facility]

## 2023-12-13 DIAGNOSIS — N760 Acute vaginitis: Secondary | ICD-10-CM | POA: Diagnosis not present

## 2023-12-13 DIAGNOSIS — B9689 Other specified bacterial agents as the cause of diseases classified elsewhere: Secondary | ICD-10-CM

## 2023-12-13 DIAGNOSIS — Z3A26 26 weeks gestation of pregnancy: Secondary | ICD-10-CM | POA: Diagnosis not present

## 2023-12-13 DIAGNOSIS — O23592 Infection of other part of genital tract in pregnancy, second trimester: Secondary | ICD-10-CM | POA: Diagnosis not present

## 2023-12-13 DIAGNOSIS — O26892 Other specified pregnancy related conditions, second trimester: Secondary | ICD-10-CM

## 2023-12-13 LAB — WET PREP, GENITAL
Sperm: NONE SEEN
Trich, Wet Prep: NONE SEEN
WBC, Wet Prep HPF POC: >=10 — AB (ref <10)
Yeast Wet Prep HPF POC: NONE SEEN

## 2023-12-13 MED ORDER — METRONIDAZOLE 500 MG PO TABS: 500.0000 mg | ORAL_TABLET | Freq: Two times a day (BID) | ORAL | 0 refills | Status: DC

## 2023-12-24 NOTE — L&D Delivery Note (Signed)
 Delivery Note At 7:17 AM a viable female was delivered via Vaginal, Spontaneous (Presentation: Right Occiput Anterior).  APGAR: 8, 9; weight  .   Placenta status: Spontaneous, Intact.  Cord: 3 vessels with the following complications: None.   Anesthesia: Epidural Episiotomy: None Lacerations: 2nd degree;Perineal;Labial;1st degree Suture Repair: 2.0 vicryl Est. Blood Loss (mL): 467  Mom to postpartum.  Baby to Couplet care / Skin to Skin.  Keiton Cosma A D'iorio 03/06/2024, 8:10 AM

## 2023-12-31 ENCOUNTER — Ambulatory Visit: Payer: 59 | Admitting: Physical Therapy

## 2024-01-07 ENCOUNTER — Ambulatory Visit: Payer: 59 | Attending: Obstetrics | Admitting: Physical Therapy

## 2024-01-07 DIAGNOSIS — O26892 Other specified pregnancy related conditions, second trimester: Secondary | ICD-10-CM | POA: Insufficient documentation

## 2024-01-07 DIAGNOSIS — R102 Pelvic and perineal pain: Secondary | ICD-10-CM | POA: Insufficient documentation

## 2024-01-07 DIAGNOSIS — M79652 Pain in left thigh: Secondary | ICD-10-CM | POA: Diagnosis present

## 2024-01-07 DIAGNOSIS — M5459 Other low back pain: Secondary | ICD-10-CM | POA: Insufficient documentation

## 2024-01-07 NOTE — Therapy (Signed)
 OUTPATIENT PHYSICAL THERAPY THORACOLUMBAR PROGRESS NOTE   Patient Name: Robin Suarez MRN: [id number] DOB:August 25, 1996, 28 y.o., female Today's Date: 01/07/2024  END OF SESSION:  PT End of Session - 01/07/24 0931     Visit Number 2    Date for PT Re-Evaluation 03/03/24    Authorization Type [employer] Medicaid    PT Start Time 0932    PT Stop Time 1015    PT Time Calculation (min) 43 min    Activity Tolerance Patient tolerated treatment well             Past Medical History:  Diagnosis Date   Anxiety and depression 11/11/2011   Chicken pox as a child   Contact dermatitis 06/04/2012   TO NICKLE   Depression with anxiety 11/11/2011   Bipolar disorder screen is negative but borderline.     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   Migraine 04/16/2016   Nickel allergy    Obesity 11/11/2011   PT HAS LOST AT LEAST 100 LBS OVER PAST 9 TO 10 MONTHS-NO LONGER OBESE   Overactive bladder    NO LONGER A PROBLEM   PONV (postoperative nausea and vomiting)    NAUSEA AFTER ONE SURGERY AS A CHILD   Seasonal allergies 07/23/2021   Social anxiety disorder 11/11/2011   Vesico-ureteral reflux    BLADDER REFLUX AS A CHILD - NO PROBLEM NOW   Past Surgical History:  Procedure Laterality Date   broken arm  2002   left, repair of ulna and radius   CHOLECYSTECTOMY N/A 08/09/2014   Procedure: LAPAROSCOPIC CHOLECYSTECTOMY WITH INTRAOPERATIVE CHOLANGIOGRAM;  Surgeon: Fran Imus, MD;  Location: [facility];  Service: General;  Laterality: N/A;   Tympanostomy with myringotomy Bilateral 1996   WISDOM TOOTH EXTRACTION  10/23/2012   Patient Active Problem List   Diagnosis Date Noted   Vitamin B12 deficiency 07/23/2021   Reactive airway disease 07/23/2021   Migraine 04/16/2016   Hyperhidrosis 11/11/2011   Overactive bladder    Vesico-ureteral reflux     PCP: Napolean Backbone DO  REFERRING PROVIDER: Audelia Leaks MD  REFERRING  DIAG: R10.2 pelvic pain  Rationale for Evaluation and Treatment: Rehabilitation  THERAPY DIAG:  Back pain; weakness  ONSET DATE: last few months  SUBJECTIVE:                                                                                                                                                                                           SUBJECTIVE STATEMENT: [redacted] weeks pregnant they are going to do growth scans to determine if  the baby is ahead of schedule.  it's getting hard to get up /down from the floor.  Cat /cow pulls a lot.  Turning over in bed its like lightning bolts. The maternity belt aggravates but the mama tape helps.  Sitting on ball is immediate relief.  I sit on the ball to watch tv.  I do the TM and recumbent bike at the gym but I'm not sure what else is safe for me to do. Rides bike and does TM, next day soreness PERTINENT HISTORY:  Right Thoracic Outlet syndrome Migraines Rides horses Has KT at home but hasn't tried [redacted] weeks pregnant with boy measuring 98%:  Due date March 26th  PAIN:   Are you having pain? Yes NPRS scale: 5/10 Pain location: left buttock to back of thigh, inner thigh to pubic region Pain orientation: Right and Left  PAIN TYPE: throbbing Pain description: tingling  Aggravating factors: lying to standing; sitting; pinch with rotating left leg to put on socks/shoes; sit to stand Relieving factors: moving   PRECAUTIONS: Other: pregnancy    WEIGHT BEARING RESTRICTIONS: No  FALLS:  Has patient fallen in last 6 months? No  LIVING ENVIRONMENT: Lives with: lives with their spouse Lives in: House/apartment   OCCUPATION: self employed  PLOF: Independent  PATIENT GOALS: symptom relief for remainder of pregnancy   OBJECTIVE:  Note: Objective measures were completed at Evaluation unless otherwise noted.  DIAGNOSTIC FINDINGS:  None  PATIENT SURVEYS:  Modified Oswestry 46% moderate disability   COGNITION: Overall cognitive status:  Within functional limits for tasks assessed      POSTURE: No Significant postural limitations   LUMBAR ROM:   AROM eval  Flexion 25% limited  Extension WFLs  Right lateral flexion WFLs  Left lateral flexion WFLs  Right rotation   Left rotation    (Blank rows = not tested) TRUNK STRENGTH:  Decreased activation of transverse abdominus muscles; abdominals 4-/5; decreased activation of lumbar multifidi; trunk extensors 4-/5  LOWER EXTREMITY ROM:   decreased left external rotation 35 degrees  LOWER EXTREMITY MMT:  grossly WFLs except hip abduction and hip flexion left 4/5;  difficulty stabilizing on left with SLS  FUNCTIONAL TESTS:  Able to rise from standard height chair without UE assist;  able to squat to pick up small object from the floor  GAIT: Comments: decreased gait speed  TODAY'S TREATMENT:                                                                                                                              DATE: 1/15 Review of status and response to current activity Nu-Step L3 (green machine) 5 min  Seated 6# core series 10x each  Standing core strengthening series holdin 6 pound dumbbells while marching sets of 10 reps each:  1) Farmers hold; 2) single at the shoulder hold; 3) single overhead press hold   seated cable row 10# 2x10 Standing Lat bar  25# 2x10  Leg press 60# bil; 40# 10x right/left single leg     PATIENT EDUCATION:  Education details: Educated patient on anatomy and physiology of current symptoms, prognosis, plan of care as well as initial self care strategies to promote recovery Person educated: Patient Education method: Explanation Education comprehension: verbalized understanding  HOME EXERCISE PROGRAM: Access Code: [email or web address] URL: [email or web address] Date: 12/10/2023 Prepared by: Darien Eden  Exercises - Seated Hip Flexor Stretch (Mirrored)  - 1 x daily - 7 x weekly - 1 sets - 3 reps - 20 hold - Seated Hip Flexor  Stretch on Swiss Ball (Mirrored)  - 1 x daily - 7 x weekly - 1 sets - 3 reps - 20 hold - Hip Flexor Stretch with Chair (Mirrored)  - 1 x daily - 7 x weekly - 1 sets - 10 reps - Standing Lumbar Extension  - 1 x daily - 7 x weekly - 3 sets - 10 reps - Quadruped Cat Cow  - 1 x daily - 7 x weekly - 3 sets - 10 reps - Quadruped Rocking Slow  - 1 x daily - 7 x weekly - 1 sets - 10 reps - Seated Hip Adductor Stretch on Swiss Ball  - 1 x daily - 7 x weekly - 1 sets - 10 reps - Pelvic Tilt on Swiss Ball  - 1 x daily - 7 x weekly - 1 sets - 10 reps  ASSESSMENT:  CLINICAL IMPRESSION: Svetlana reports she is having more difficulty getting up and down from the floor and with some ex's as she has entered her 3rd trimester of pregnancy with the baby measuring in the 98% for size.  She feels better with light exercise although she reports she gets out of breath when she does the treadmill at the gym.   She would be a good candidate for aquatic PT for mobility exercise and pain relief of her lumbo/pelvic pain.    OBJECTIVE IMPAIRMENTS: decreased activity tolerance, decreased strength, impaired perceived functional ability, and pain.   ACTIVITY LIMITATIONS: sitting, standing, dressing, and locomotion level  PARTICIPATION LIMITATIONS: meal prep, cleaning, laundry, and community activity  PERSONAL FACTORS:  no previous history of lumbar/hip issues positively   affecting patient's functional outcome.   REHAB POTENTIAL: Good  CLINICAL DECISION MAKING: Stable/uncomplicated  EVALUATION COMPLEXITY: Low   GOALS: Goals reviewed with patient? Yes  SHORT TERM GOALS: Target date: 01/21/2024   The patient will demonstrate knowledge of basic self care strategies and exercises to promote healing  Baseline: Goal status: INITIAL  2.  The patient will report a 25% improvement in pain levels with functional activities which are currently difficult including moving from lying to standing/sit to stand Baseline:   Goal status: INITIAL  3.  The patient will be able to perform light to medium intensity exercise 3x/week with pain level <5/10 Baseline:  Goal status: INITIAL    LONG TERM GOALS: Target date: 03/03/2024   The patient will be independent in a safe self progression of a home exercise program to promote further recovery of function  Baseline:  Goal status: INITIAL  2.  The patient will report a 50% improvement in pain levels with functional activities which are currently difficult including moving from lying to standing/sit to stand Baseline:  Goal status: INITIAL  3.  The patient will have improved trunk flexor and extensor muscle strength to at least 4+/5 needed for lifting her infant and babygear Baseline:  Goal status: INITIAL  4.  Modified Oswestry score improved to  35% indicating improved function with less pain Baseline:  Goal status: INITIAL    PLAN:  PT FREQUENCY: 1x/week  PT DURATION: 12 weeks  PLANNED INTERVENTIONS: 97164- PT Re-evaluation, 97110-Therapeutic exercises, 97530- Therapeutic activity, [id number]- Neuromuscular re-education, 97535- Self Care, 30865- Manual therapy, [id number]- Aquatic Therapy, 97014- Electrical stimulation (unattended), [id number]- Electrical stimulation (manual), Patient/Family education, Taping, Dry Needling, Spinal mobilization, Cryotherapy, and Moist heat.  PLAN FOR NEXT SESSION: aquatic PT;  core strength; lumbar and pelvic mobility; no supine   Darien Eden, PT 01/07/24 4:55 PM Phone: [phone] Fax: [phone]

## 2024-01-07 NOTE — Patient Instructions (Signed)
   [facility] Welcome to [facility]! Here you will find all the information you will need regarding your pool therapy. If you have further questions at any time, please call our office at [phone]. After completing your initial evaluation in the [facility] clinic, you may be eligible to complete a portion of your therapy in the pool. A typical week of therapy will consist of 1-2 typical physical therapy visits at our [facility] location and an additional session of therapy in the pool located at the [facility] at [facility]. [address] [place] [address]. The phone number at the pool site is [phone]. Please call this number if you are running late or need to cancel your appointment.  Aquatic therapy will be offered on Wednesday mornings and Friday afternoons. Each session will last approximately 45 minutes. All scheduling and payments for aquatic therapy sessions, including cancelations, will be done through our [facility] location.  To be eligible for aquatic therapy, these criteria must be met: You must be able to independently change in the locker room and get to the pool deck. A caregiver can come with you to help if needed. There are benches for a caregiver to sit on next to the pool. No one with an open wound is permitted in the pool.  Handicap parking is available in the front and there is a drop off option for even closer accessibility. Please arrive 15 minutes prior to your appointment to prepare for your pool session. You must sign in at the front desk upon your arrival. Please be sure to attend to any toileting needs prior to entering the pool. Locker rooms for changing are available.  There is direct access to the pool deck from the locker room. You can lock your belongings in a locker or bring them with you poolside. Your therapist will greet you on the pool deck. There may be other swimmers in the pool at the  same time but your session is one-on-one with the therapist.

## 2024-01-14 ENCOUNTER — Encounter: Payer: 59 | Admitting: Physical Therapy

## 2024-01-14 ENCOUNTER — Ambulatory Visit: Payer: 59 | Admitting: Physical Therapy

## 2024-01-14 NOTE — Therapy (Deleted)
OUTPATIENT PHYSICAL THERAPY THORACOLUMBAR PROGRESS NOTE   Patient Name: Robin Suarez MRN: [id number] DOB:16-Mar-1996, 28 y.o., female Today's Date: 01/14/2024  END OF SESSION:    Past Medical History:  Diagnosis Date   Anxiety and depression 11/11/2011   Chicken pox as a child   Contact dermatitis 06/04/2012   TO NICKLE   Depression with anxiety 11/11/2011   Bipolar disorder screen is negative but borderline.     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   Migraine 04/16/2016   Nickel allergy    Obesity 11/11/2011   PT HAS LOST AT LEAST 100 LBS OVER PAST 9 TO 10 MONTHS-NO LONGER OBESE   Overactive bladder    NO LONGER A PROBLEM   PONV (postoperative nausea and vomiting)    NAUSEA AFTER ONE SURGERY AS A CHILD   Seasonal allergies 07/23/2021   Social anxiety disorder 11/11/2011   Vesico-ureteral reflux    BLADDER REFLUX AS A CHILD - NO PROBLEM NOW   Past Surgical History:  Procedure Laterality Date   broken arm  2002   left, repair of ulna and radius   CHOLECYSTECTOMY N/A 08/09/2014   Procedure: LAPAROSCOPIC CHOLECYSTECTOMY WITH INTRAOPERATIVE CHOLANGIOGRAM;  Surgeon: Atilano Ina, MD;  Location: [facility];  Service: General;  Laterality: N/A;   Tympanostomy with myringotomy Bilateral 1996   WISDOM TOOTH EXTRACTION  10/23/2012   Patient Active Problem List   Diagnosis Date Noted   Vitamin B12 deficiency 07/23/2021   Reactive airway disease 07/23/2021   Migraine 04/16/2016   Hyperhidrosis 11/11/2011   Overactive bladder    Vesico-ureteral reflux     PCP: Felix Pacini DO  REFERRING PROVIDER: Noland Fordyce MD  REFERRING DIAG: R10.2 pelvic pain  Rationale for Evaluation and Treatment: Rehabilitation  THERAPY DIAG:  Back pain; weakness  ONSET DATE: last few months  SUBJECTIVE:                                                                                                                                                                                            SUBJECTIVE STATEMENT:  PERTINENT HISTORY:  Right Thoracic Outlet syndrome Migraines Rides horses Has KT at home but hasn't tried [redacted] weeks pregnant with boy measuring 98%:  Due date March 26th  PAIN:   Are you having pain? Yes NPRS scale: 5/10 Pain location: left buttock to back of thigh, inner thigh to pubic region Pain orientation: Right and Left  PAIN TYPE: throbbing Pain description: tingling  Aggravating factors: lying to standing; sitting; pinch with rotating left leg to put on socks/shoes; sit  to stand Relieving factors: moving   PRECAUTIONS: Other: pregnancy    WEIGHT BEARING RESTRICTIONS: No  FALLS:  Has patient fallen in last 6 months? No  LIVING ENVIRONMENT: Lives with: lives with their spouse Lives in: House/apartment   OCCUPATION: self employed  PLOF: Independent  PATIENT GOALS: symptom relief for remainder of pregnancy   OBJECTIVE:  Note: Objective measures were completed at Evaluation unless otherwise noted.  DIAGNOSTIC FINDINGS:  None  PATIENT SURVEYS:  Modified Oswestry 46% moderate disability   COGNITION: Overall cognitive status: Within functional limits for tasks assessed      POSTURE: No Significant postural limitations   LUMBAR ROM:   AROM eval  Flexion 25% limited  Extension WFLs  Right lateral flexion WFLs  Left lateral flexion WFLs  Right rotation   Left rotation    (Blank rows = not tested) TRUNK STRENGTH:  Decreased activation of transverse abdominus muscles; abdominals 4-/5; decreased activation of lumbar multifidi; trunk extensors 4-/5  LOWER EXTREMITY ROM:   decreased left external rotation 35 degrees  LOWER EXTREMITY MMT:  grossly WFLs except hip abduction and hip flexion left 4/5;  difficulty stabilizing on left with SLS  FUNCTIONAL TESTS:  Able to rise from standard height chair without UE assist;  able to squat to pick up small object from  the floor  GAIT: Comments: decreased gait speed  TODAY'S TREATMENT:                                                                                                                              DATE:   01/14/24:Pt arrives for aquatic physical therapy. Treatment took place in 3.5-5.5 feet of water. Water temperature was 92 degrees F. Pt entered the pool via slowly using rails for support. Pt requires buoyancy of water for support and to offload joints with strengthening exercises.  Pt utilizes viscosity of the water required for strengthening. Seated water bench with 75% submersion Pt performed seated LE AROM exercises 20x in all planes, concurrent discussion of current status, education of water principles and how to use them.  75% depth water walking 4x each direction natural UE. 3 way hip kicks 10x each with emphasis on ROM.      1/15 Review of status and response to current activity Nu-Step L3 (green machine) 5 min  Seated 6# core series 10x each  Standing core strengthening series holdin 6 pound dumbbells while marching sets of 10 reps each:  1) Farmers hold; 2) single at the shoulder hold; 3) single overhead press hold   seated cable row 10# 2x10 Standing Lat bar  25# 2x10   Leg press 60# bil; 40# 10x right/left single leg     PATIENT EDUCATION:  Education details: Educated patient on anatomy and physiology of current symptoms, prognosis, plan of care as well as initial self care strategies to promote recovery Person educated: Patient Education method: Explanation Education comprehension: verbalized understanding  HOME EXERCISE PROGRAM: Access Code: [email or web address] URL:  [email or web address] Date: 12/10/2023 Prepared by: Lavinia Sharps  Exercises - Seated Hip Flexor Stretch (Mirrored)  - 1 x daily - 7 x weekly - 1 sets - 3 reps - 20 hold - Seated Hip Flexor Stretch on Swiss Ball (Mirrored)  - 1 x daily - 7 x weekly - 1 sets - 3 reps - 20 hold - Hip Flexor Stretch  with Chair (Mirrored)  - 1 x daily - 7 x weekly - 1 sets - 10 reps - Standing Lumbar Extension  - 1 x daily - 7 x weekly - 3 sets - 10 reps - Quadruped Cat Cow  - 1 x daily - 7 x weekly - 3 sets - 10 reps - Quadruped Rocking Slow  - 1 x daily - 7 x weekly - 1 sets - 10 reps - Seated Hip Adductor Stretch on Swiss Ball  - 1 x daily - 7 x weekly - 1 sets - 10 reps - Pelvic Tilt on Swiss Ball  - 1 x daily - 7 x weekly - 1 sets - 10 reps  ASSESSMENT:  CLINICAL IMPRESSION: Pt arrives for initial aquatic PT treatment. Pt was educated in water principles and how to use them with verbal understanding.     OBJECTIVE IMPAIRMENTS: decreased activity tolerance, decreased strength, impaired perceived functional ability, and pain.   ACTIVITY LIMITATIONS: sitting, standing, dressing, and locomotion level  PARTICIPATION LIMITATIONS: meal prep, cleaning, laundry, and community activity  PERSONAL FACTORS:  no previous history of lumbar/hip issues positively   affecting patient's functional outcome.   REHAB POTENTIAL: Good  CLINICAL DECISION MAKING: Stable/uncomplicated  EVALUATION COMPLEXITY: Low   GOALS: Goals reviewed with patient? Yes  SHORT TERM GOALS: Target date: 01/21/2024   The patient will demonstrate knowledge of basic self care strategies and exercises to promote healing  Baseline: Goal status: INITIAL  2.  The patient will report a 25% improvement in pain levels with functional activities which are currently difficult including moving from lying to standing/sit to stand Baseline:  Goal status: INITIAL  3.  The patient will be able to perform light to medium intensity exercise 3x/week with pain level <5/10 Baseline:  Goal status: INITIAL    LONG TERM GOALS: Target date: 03/03/2024   The patient will be independent in a safe self progression of a home exercise program to promote further recovery of function  Baseline:  Goal status: INITIAL  2.  The patient will report a  50% improvement in pain levels with functional activities which are currently difficult including moving from lying to standing/sit to stand Baseline:  Goal status: INITIAL  3.  The patient will have improved trunk flexor and extensor muscle strength to at least 4+/5 needed for lifting her infant and babygear Baseline:  Goal status: INITIAL  4.  Modified Oswestry score improved to 35% indicating improved function with less pain Baseline:  Goal status: INITIAL    PLAN:  PT FREQUENCY: 1x/week  PT DURATION: 12 weeks  PLANNED INTERVENTIONS: 97164- PT Re-evaluation, 97110-Therapeutic exercises, 97530- Therapeutic activity, [id number]- Neuromuscular re-education, 97535- Self Care, 53664- Manual therapy, [id number]- Aquatic Therapy, 97014- Electrical stimulation (unattended), [id number]- Electrical stimulation (manual), Patient/Family education, Taping, Dry Needling, Spinal mobilization, Cryotherapy, and Moist heat.  PLAN FOR NEXT SESSION: aquatic PT;  core strength; lumbar and pelvic mobility; no supine   Ane Payment, PTA 01/14/24 8:43 AM

## 2024-01-15 NOTE — Therapy (Signed)
OUTPATIENT PHYSICAL THERAPY THORACOLUMBAR PROGRESS NOTE   Patient Name: Robin Suarez MRN: [id number] DOB:April 04, 1996, 28 y.o., female Today's Date: 01/16/2024  END OF SESSION:  PT End of Session - 01/16/24 1610     Visit Number 3    Date for PT Re-Evaluation 03/03/24    Authorization Type [employer]    PT Start Time 1515    PT Stop Time 1550    PT Time Calculation (min) 35 min    Activity Tolerance Patient tolerated treatment well    Behavior During Therapy [facility] for tasks assessed/performed              Past Medical History:  Diagnosis Date   Anxiety and depression 11/11/2011   Chicken pox as a child   Contact dermatitis 06/04/2012   TO NICKLE   Depression with anxiety 11/11/2011   Bipolar disorder screen is negative but borderline.     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   Migraine 04/16/2016   Nickel allergy    Obesity 11/11/2011   PT HAS LOST AT LEAST 100 LBS OVER PAST 9 TO 10 MONTHS-NO LONGER OBESE   Overactive bladder    NO LONGER A PROBLEM   PONV (postoperative nausea and vomiting)    NAUSEA AFTER ONE SURGERY AS A CHILD   Seasonal allergies 07/23/2021   Social anxiety disorder 11/11/2011   Vesico-ureteral reflux    BLADDER REFLUX AS A CHILD - NO PROBLEM NOW   Past Surgical History:  Procedure Laterality Date   broken arm  2002   left, repair of ulna and radius   CHOLECYSTECTOMY N/A 08/09/2014   Procedure: LAPAROSCOPIC CHOLECYSTECTOMY WITH INTRAOPERATIVE CHOLANGIOGRAM;  Surgeon: Atilano Ina, MD;  Location: [facility];  Service: General;  Laterality: N/A;   Tympanostomy with myringotomy Bilateral 1996   WISDOM TOOTH EXTRACTION  10/23/2012   Patient Active Problem List   Diagnosis Date Noted   Vitamin B12 deficiency 07/23/2021   Reactive airway disease 07/23/2021   Migraine 04/16/2016   Hyperhidrosis 11/11/2011   Overactive bladder    Vesico-ureteral reflux     PCP: Felix Pacini DO  REFERRING PROVIDER: Noland Fordyce MD  REFERRING DIAG: R10.2 pelvic pain  Rationale for Evaluation and Treatment: Rehabilitation  THERAPY DIAG:  Back pain; weakness  ONSET DATE: last few months  SUBJECTIVE:                                                                                                                                                                                           SUBJECTIVE STATEMENT: I am  in a lot of pelvic pain today.   PERTINENT HISTORY:  Right Thoracic Outlet syndrome Migraines Rides horses Has KT at home but hasn't tried [redacted] weeks pregnant with boy measuring 98%:  Due date March 26th  PAIN:   Are you having pain? Yes NPRS scale: 7-8/10 Pain location: left buttock to back of thigh, inner thigh to pubic region Pain orientation: Right and Left  PAIN TYPE: throbbing Pain description: tingling  Aggravating factors: lying to standing; sitting; pinch with rotating left leg to put on socks/shoes; sit to stand Relieving factors: moving   PRECAUTIONS: Other: pregnancy    WEIGHT BEARING RESTRICTIONS: No  FALLS:  Has patient fallen in last 6 months? No  LIVING ENVIRONMENT: Lives with: lives with their spouse Lives in: House/apartment   OCCUPATION: self employed  PLOF: Independent  PATIENT GOALS: symptom relief for remainder of pregnancy   OBJECTIVE:  Note: Objective measures were completed at Evaluation unless otherwise noted.  DIAGNOSTIC FINDINGS:  None  PATIENT SURVEYS:  Modified Oswestry 46% moderate disability   COGNITION: Overall cognitive status: Within functional limits for tasks assessed      POSTURE: No Significant postural limitations   LUMBAR ROM:   AROM eval  Flexion 25% limited  Extension WFLs  Right lateral flexion WFLs  Left lateral flexion WFLs  Right rotation   Left rotation    (Blank rows = not tested) TRUNK STRENGTH:  Decreased activation of transverse abdominus muscles; abdominals 4-/5;  decreased activation of lumbar multifidi; trunk extensors 4-/5  LOWER EXTREMITY ROM:   decreased left external rotation 35 degrees  LOWER EXTREMITY MMT:  grossly WFLs except hip abduction and hip flexion left 4/5;  difficulty stabilizing on left with SLS  FUNCTIONAL TESTS:  Able to rise from standard height chair without UE assist;  able to squat to pick up small object from the floor  GAIT: Comments: decreased gait speed  TODAY'S TREATMENT:                                                                                                                              DATE:   01/16/24:Pt arrives for aquatic physical therapy. Treatment took place in 3.5-5.5 feet of water. Water temperature was 92 degrees F. Pt entered the pool via slowly using rails for support. Pt requires buoyancy of water for support and to offload joints with strengthening exercises.  Pt utilizes viscosity of the water required for strengthening. Seated water bench with 75% submersion Pt performed seated LE AROM exercises 20x in all planes, concurrent discussion of current status, education of water principles and how to use them.  75% depth water walking 4x each direction natural UE. Seated decompression 3 min for pain reduction. Hamstring stretch with ankle pumps2x10 on second stair. Static stance with arms forward /back 15x VC to contract buttocks. 5 min seated decompression with noodle.     1/15 Review of status and response to current activity Nu-Step L3 (green machine) 5  min  Seated 6# core series 10x each  Standing core strengthening series holdin 6 pound dumbbells while marching sets of 10 reps each:  1) Farmers hold; 2) single at the shoulder hold; 3) single overhead press hold   seated cable row 10# 2x10 Standing Lat bar  25# 2x10   Leg press 60# bil; 40# 10x right/left single leg     PATIENT EDUCATION:  Education details: Educated patient on anatomy and physiology of current symptoms, prognosis, plan of  care as well as initial self care strategies to promote recovery Person educated: Patient Education method: Explanation Education comprehension: verbalized understanding  HOME EXERCISE PROGRAM: Access Code: [email or web address] URL: [email or web address] Date: 12/10/2023 Prepared by: Lavinia Sharps  Exercises - Seated Hip Flexor Stretch (Mirrored)  - 1 x daily - 7 x weekly - 1 sets - 3 reps - 20 hold - Seated Hip Flexor Stretch on Swiss Ball (Mirrored)  - 1 x daily - 7 x weekly - 1 sets - 3 reps - 20 hold - Hip Flexor Stretch with Chair (Mirrored)  - 1 x daily - 7 x weekly - 1 sets - 10 reps - Standing Lumbar Extension  - 1 x daily - 7 x weekly - 3 sets - 10 reps - Quadruped Cat Cow  - 1 x daily - 7 x weekly - 3 sets - 10 reps - Quadruped Rocking Slow  - 1 x daily - 7 x weekly - 1 sets - 10 reps - Seated Hip Adductor Stretch on Swiss Ball  - 1 x daily - 7 x weekly - 1 sets - 10 reps - Pelvic Tilt on Swiss Ball  - 1 x daily - 7 x weekly - 1 sets - 10 reps  ASSESSMENT:  CLINICAL IMPRESSION: Pt arrives for initial aquatic PT treatment. Pt was educated in water principles and how to use them with verbal understanding. Pt entered pool with a lot of pelvic pain; once submerged pain abolished. Decompression position was very relieving position per pt report.     OBJECTIVE IMPAIRMENTS: decreased activity tolerance, decreased strength, impaired perceived functional ability, and pain.   ACTIVITY LIMITATIONS: sitting, standing, dressing, and locomotion level  PARTICIPATION LIMITATIONS: meal prep, cleaning, laundry, and community activity  PERSONAL FACTORS:  no previous history of lumbar/hip issues positively   affecting patient's functional outcome.   REHAB POTENTIAL: Good  CLINICAL DECISION MAKING: Stable/uncomplicated  EVALUATION COMPLEXITY: Low   GOALS: Goals reviewed with patient? Yes  SHORT TERM GOALS: Target date: 01/21/2024   The patient will demonstrate knowledge of basic  self care strategies and exercises to promote healing  Baseline: Goal status: INITIAL  2.  The patient will report a 25% improvement in pain levels with functional activities which are currently difficult including moving from lying to standing/sit to stand Baseline:  Goal status: INITIAL  3.  The patient will be able to perform light to medium intensity exercise 3x/week with pain level <5/10 Baseline:  Goal status: INITIAL    LONG TERM GOALS: Target date: 03/03/2024   The patient will be independent in a safe self progression of a home exercise program to promote further recovery of function  Baseline:  Goal status: INITIAL  2.  The patient will report a 50% improvement in pain levels with functional activities which are currently difficult including moving from lying to standing/sit to stand Baseline:  Goal status: INITIAL  3.  The patient will have improved trunk flexor and extensor muscle strength to at least 4+/5 needed for  lifting her infant and babygear Baseline:  Goal status: INITIAL  4.  Modified Oswestry score improved to 35% indicating improved function with less pain Baseline:  Goal status: INITIAL    PLAN:  PT FREQUENCY: 1x/week  PT DURATION: 12 weeks  PLANNED INTERVENTIONS: 97164- PT Re-evaluation, 97110-Therapeutic exercises, 97530- Therapeutic activity, [id number]- Neuromuscular re-education, 97535- Self Care, 16109- Manual therapy, [id number]- Aquatic Therapy, 97014- Electrical stimulation (unattended), [id number]- Electrical stimulation (manual), Patient/Family education, Taping, Dry Needling, Spinal mobilization, Cryotherapy, and Moist heat.  PLAN FOR NEXT SESSION: asses how  she did with aquatic PT;  core strength; lumbar and pelvic mobility; no supine   Ane Payment, PTA 01/16/24 4:11 PM

## 2024-01-16 ENCOUNTER — Encounter: Payer: Self-pay | Admitting: Physical Therapy

## 2024-01-16 ENCOUNTER — Ambulatory Visit: Payer: 59 | Admitting: Physical Therapy

## 2024-01-16 DIAGNOSIS — O26892 Other specified pregnancy related conditions, second trimester: Secondary | ICD-10-CM

## 2024-01-16 DIAGNOSIS — M5459 Other low back pain: Secondary | ICD-10-CM

## 2024-01-16 DIAGNOSIS — M79652 Pain in left thigh: Secondary | ICD-10-CM

## 2024-01-21 ENCOUNTER — Ambulatory Visit: Payer: 59 | Admitting: Physical Therapy

## 2024-01-21 DIAGNOSIS — O26892 Other specified pregnancy related conditions, second trimester: Secondary | ICD-10-CM

## 2024-01-21 DIAGNOSIS — M5459 Other low back pain: Secondary | ICD-10-CM

## 2024-01-21 DIAGNOSIS — M79652 Pain in left thigh: Secondary | ICD-10-CM

## 2024-01-21 NOTE — Therapy (Signed)
OUTPATIENT PHYSICAL THERAPY THORACOLUMBAR PROGRESS NOTE   Patient Name: Robin Suarez MRN: [id number] DOB:February 16, 1996, 28 y.o., female Today's Date: 01/21/2024  END OF SESSION:  PT End of Session - 01/21/24 0943     Visit Number 4    Date for PT Re-Evaluation 03/03/24    Authorization Type [employer]    PT Start Time 0932    PT Stop Time 1013    PT Time Calculation (min) 41 min    Activity Tolerance Patient tolerated treatment well              Past Medical History:  Diagnosis Date   Anxiety and depression 11/11/2011   Chicken pox as a child   Contact dermatitis 06/04/2012   TO NICKLE   Depression with anxiety 11/11/2011   Bipolar disorder screen is negative but borderline.     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   Migraine 04/16/2016   Nickel allergy    Obesity 11/11/2011   PT HAS LOST AT LEAST 100 LBS OVER PAST 9 TO 10 MONTHS-NO LONGER OBESE   Overactive bladder    NO LONGER A PROBLEM   PONV (postoperative nausea and vomiting)    NAUSEA AFTER ONE SURGERY AS A CHILD   Seasonal allergies 07/23/2021   Social anxiety disorder 11/11/2011   Vesico-ureteral reflux    BLADDER REFLUX AS A CHILD - NO PROBLEM NOW   Past Surgical History:  Procedure Laterality Date   broken arm  2002   left, repair of ulna and radius   CHOLECYSTECTOMY N/A 08/09/2014   Procedure: LAPAROSCOPIC CHOLECYSTECTOMY WITH INTRAOPERATIVE CHOLANGIOGRAM;  Surgeon: Atilano Ina, MD;  Location: [facility];  Service: General;  Laterality: N/A;   Tympanostomy with myringotomy Bilateral 1996   WISDOM TOOTH EXTRACTION  10/23/2012   Patient Active Problem List   Diagnosis Date Noted   Vitamin B12 deficiency 07/23/2021   Reactive airway disease 07/23/2021   Migraine 04/16/2016   Hyperhidrosis 11/11/2011   Overactive bladder    Vesico-ureteral reflux     PCP: Felix Pacini DO  REFERRING PROVIDER: Noland Fordyce  MD  REFERRING DIAG: R10.2 pelvic pain  Rationale for Evaluation and Treatment: Rehabilitation  THERAPY DIAG:  Back pain; weakness  ONSET DATE: last few months  SUBJECTIVE:                                                                                                                                                                                           SUBJECTIVE STATEMENT:  Liked the pool.  It was the first time I could  walk without pain in a long time.  It was hard to dress/undress in the locker room/  PERTINENT HISTORY:  Right Thoracic Outlet syndrome Migraines Rides horses Has KT at home but hasn't tried [redacted] weeks pregnant with boy measuring 98%:  Due date March 26th  PAIN:   Are you having pain? Yes NPRS scale: 7/10 feels like a tearing when I walk Pain location: left buttock to back of thigh, inner thigh to pubic region Pain orientation: Right and Left  PAIN TYPE: throbbing Pain description: tingling  Aggravating factors: lying to standing; sitting; pinch with rotating left leg to put on socks/shoes; sit to stand Relieving factors: moving   PRECAUTIONS: Other: pregnancy    WEIGHT BEARING RESTRICTIONS: No  FALLS:  Has patient fallen in last 6 months? No  LIVING ENVIRONMENT: Lives with: lives with their spouse Lives in: House/apartment   OCCUPATION: self employed  PLOF: Independent  PATIENT GOALS: symptom relief for remainder of pregnancy   OBJECTIVE:  Note: Objective measures were completed at Evaluation unless otherwise noted.  DIAGNOSTIC FINDINGS:  None  PATIENT SURVEYS:  Modified Oswestry 46% moderate disability   COGNITION: Overall cognitive status: Within functional limits for tasks assessed      POSTURE: No Significant postural limitations   LUMBAR ROM:   AROM eval  Flexion 25% limited  Extension WFLs  Right lateral flexion WFLs  Left lateral flexion WFLs  Right rotation   Left rotation    (Blank rows = not tested) TRUNK  STRENGTH:  Decreased activation of transverse abdominus muscles; abdominals 4-/5; decreased activation of lumbar multifidi; trunk extensors 4-/5  LOWER EXTREMITY ROM:   decreased left external rotation 35 degrees  LOWER EXTREMITY MMT:  grossly WFLs except hip abduction and hip flexion left 4/5;  difficulty stabilizing on left with SLS  FUNCTIONAL TESTS:  Able to rise from standard height chair without UE assist;  able to squat to pick up small object from the floor  GAIT: Comments: decreased gait speed  TODAY'S TREATMENT:                                                                                                                              DATE:  01/21/24: Review of status and response to current activity Nu-Step L3 (blue machine) 10 min  Seated 6# core series: chops and hip to hip 10x each  Standing core strengthening series holding 6 pound dumbbells while marching sets of 5 reps each 5 rounds on each side:  1) Farmers hold; 2) single at the shoulder hold; 3) single overhead press hold  Side lunge with 6# weight reach toward opposite knee Squats 6# to simulate lifting infant carrier 4x right/left Sit to stand with 6# 7x  Step ups holding 6# weight 2 sets of 7 seated cable row 10# 2x10  01/16/24:Pt arrives for aquatic physical therapy. Treatment took place in 3.5-5.5 feet of water. Water temperature was 92 degrees F. Pt entered the pool via  slowly using rails for support. Pt requires buoyancy of water for support and to offload joints with strengthening exercises.  Pt utilizes viscosity of the water required for strengthening. Seated water bench with 75% submersion Pt performed seated LE AROM exercises 20x in all planes, concurrent discussion of current status, education of water principles and how to use them.  75% depth water walking 4x each direction natural UE. Seated decompression 3 min for pain reduction. Hamstring stretch with ankle pumps2x10 on second stair. Static stance with  arms forward /back 15x VC to contract buttocks. 5 min seated decompression with noodle.     1/15 Review of status and response to current activity Nu-Step L3 (green machine) 5 min  Seated 6# core series 10x each  Standing core strengthening series holdin 6 pound dumbbells while marching sets of 10 reps each:  1) Farmers hold; 2) single at the shoulder hold; 3) single overhead press hold   seated cable row 10# 2x10 Standing Lat bar  25# 2x10   Leg press 60# bil; 40# 10x right/left single leg     PATIENT EDUCATION:  Education details: Educated patient on anatomy and physiology of current symptoms, prognosis, plan of care as well as initial self care strategies to promote recovery Person educated: Patient Education method: Explanation Education comprehension: verbalized understanding  HOME EXERCISE PROGRAM: Access Code: [email or web address] URL: [email or web address] Date: 12/10/2023 Prepared by: Lavinia Sharps  Exercises - Seated Hip Flexor Stretch (Mirrored)  - 1 x daily - 7 x weekly - 1 sets - 3 reps - 20 hold - Seated Hip Flexor Stretch on Swiss Ball (Mirrored)  - 1 x daily - 7 x weekly - 1 sets - 3 reps - 20 hold - Hip Flexor Stretch with Chair (Mirrored)  - 1 x daily - 7 x weekly - 1 sets - 10 reps - Standing Lumbar Extension  - 1 x daily - 7 x weekly - 3 sets - 10 reps - Quadruped Cat Cow  - 1 x daily - 7 x weekly - 3 sets - 10 reps - Quadruped Rocking Slow  - 1 x daily - 7 x weekly - 1 sets - 10 reps - Seated Hip Adductor Stretch on Swiss Ball  - 1 x daily - 7 x weekly - 1 sets - 10 reps - Pelvic Tilt on Swiss Ball  - 1 x daily - 7 x weekly - 1 sets - 10 reps  ASSESSMENT:  CLINICAL IMPRESSION:  Justa reports increased left pelvic/ hip pain particularly with full weight bearing on that side.  She felt good relief with the buoyancy and decompression she felt while moving in the pool.  She was added to the aquatic PT wait list.  Today she is able to do unloaded (seated)  and short duration standing with a good response with equal balance on 2 feet.  Therapist closely monitoring response throughout session and adjusting treatment as needed.  STGs not met secondary to pain associated to her 3rd trimester and the baby's size and positioning.     OBJECTIVE IMPAIRMENTS: decreased activity tolerance, decreased strength, impaired perceived functional ability, and pain.   ACTIVITY LIMITATIONS: sitting, standing, dressing, and locomotion level  PARTICIPATION LIMITATIONS: meal prep, cleaning, laundry, and community activity  PERSONAL FACTORS:  no previous history of lumbar/hip issues positively   affecting patient's functional outcome.   REHAB POTENTIAL: Good  CLINICAL DECISION MAKING: Stable/uncomplicated  EVALUATION COMPLEXITY: Low   GOALS: Goals reviewed with patient? Yes  SHORT TERM GOALS: Target date:  01/21/2024   The patient will demonstrate knowledge of basic self care strategies and exercises to promote healing  Baseline: Goal status: met 1/29  2.  The patient will report a 25% improvement in pain levels with functional activities which are currently difficult including moving from lying to standing/sit to stand Baseline:  Goal status: ongoing  3.  The patient will be able to perform light to medium intensity exercise 3x/week with pain level <5/10 Baseline:  Goal status: ongoing   LONG TERM GOALS: Target date: 03/03/2024   The patient will be independent in a safe self progression of a home exercise program to promote further recovery of function  Baseline:  Goal status: INITIAL  2.  The patient will report a 50% improvement in pain levels with functional activities which are currently difficult including moving from lying to standing/sit to stand Baseline:  Goal status: INITIAL  3.  The patient will have improved trunk flexor and extensor muscle strength to at least 4+/5 needed for lifting her infant and babygear Baseline:  Goal  status: INITIAL  4.  Modified Oswestry score improved to 35% indicating improved function with less pain Baseline:  Goal status: INITIAL    PLAN:  PT FREQUENCY: 1x/week  PT DURATION: 12 weeks  PLANNED INTERVENTIONS: 97164- PT Re-evaluation, 97110-Therapeutic exercises, 97530- Therapeutic activity, [id number]- Neuromuscular re-education, 97535- Self Care, 16109- Manual therapy, [id number]- Aquatic Therapy, 97014- Electrical stimulation (unattended), [id number]- Electrical stimulation (manual), Patient/Family education, Taping, Dry Needling, Spinal mobilization, Cryotherapy, and Moist heat.  PLAN FOR NEXT SESSION: pt added to wait list for aquatic PT for unloading/decompression; core strength; lumbar and pelvic mobility; no supine   Lavinia Sharps, PT 01/21/24 5:00 PM Phone: [phone] Fax: [phone]

## 2024-01-28 ENCOUNTER — Ambulatory Visit: Payer: 59 | Admitting: Physical Therapy

## 2024-02-04 ENCOUNTER — Encounter: Payer: 59 | Admitting: Physical Therapy

## 2024-02-11 ENCOUNTER — Encounter: Payer: 59 | Admitting: Physical Therapy

## 2024-02-13 ENCOUNTER — Ambulatory Visit: Payer: 59 | Admitting: Physical Therapy

## 2024-02-18 ENCOUNTER — Encounter: Payer: 59 | Admitting: Physical Therapy

## 2024-02-23 LAB — OB RESULTS CONSOLE GBS: GBS: NEGATIVE

## 2024-02-25 ENCOUNTER — Encounter: Payer: 59 | Admitting: Physical Therapy

## 2024-02-27 ENCOUNTER — Ambulatory Visit: Payer: 59 | Admitting: Physical Therapy

## 2024-03-03 ENCOUNTER — Encounter (HOSPITAL_COMMUNITY): Payer: Self-pay | Admitting: *Deleted

## 2024-03-03 ENCOUNTER — Telehealth (HOSPITAL_COMMUNITY): Payer: Self-pay | Admitting: *Deleted

## 2024-03-03 NOTE — Telephone Encounter (Signed)
 Preadmission screen

## 2024-03-05 ENCOUNTER — Encounter (HOSPITAL_COMMUNITY): Payer: Self-pay | Admitting: Obstetrics

## 2024-03-05 ENCOUNTER — Other Ambulatory Visit: Payer: Self-pay

## 2024-03-05 ENCOUNTER — Inpatient Hospital Stay (HOSPITAL_COMMUNITY): Admitting: Anesthesiology

## 2024-03-05 ENCOUNTER — Inpatient Hospital Stay (HOSPITAL_COMMUNITY)
Admission: RE | Admit: 2024-03-05 | Discharge: 2024-03-07 | DRG: 807 | Disposition: A | Discharge status: Home / Self Care | Payer: 59 | Attending: Obstetrics | Admitting: Obstetrics

## 2024-03-05 DIAGNOSIS — O99214 Obesity complicating childbirth: Secondary | ICD-10-CM | POA: Diagnosis present

## 2024-03-05 DIAGNOSIS — I952 Hypotension due to drugs: Secondary | ICD-10-CM | POA: Diagnosis not present

## 2024-03-05 DIAGNOSIS — E66813 Obesity, class 3: Secondary | ICD-10-CM | POA: Diagnosis present

## 2024-03-05 DIAGNOSIS — Z3A38 38 weeks gestation of pregnancy: Secondary | ICD-10-CM | POA: Diagnosis not present

## 2024-03-05 DIAGNOSIS — Z8249 Family history of ischemic heart disease and other diseases of the circulatory system: Secondary | ICD-10-CM | POA: Diagnosis not present

## 2024-03-05 DIAGNOSIS — O9962 Diseases of the digestive system complicating childbirth: Secondary | ICD-10-CM | POA: Diagnosis present

## 2024-03-05 DIAGNOSIS — O3663X0 Maternal care for excessive fetal growth, third trimester, not applicable or unspecified: Secondary | ICD-10-CM | POA: Diagnosis present

## 2024-03-05 DIAGNOSIS — O139 Gestational [pregnancy-induced] hypertension without significant proteinuria, unspecified trimester: Principal | ICD-10-CM | POA: Diagnosis present

## 2024-03-05 DIAGNOSIS — O9A22 Injury, poisoning and certain other consequences of external causes complicating childbirth: Secondary | ICD-10-CM | POA: Diagnosis not present

## 2024-03-05 DIAGNOSIS — Z833 Family history of diabetes mellitus: Secondary | ICD-10-CM

## 2024-03-05 DIAGNOSIS — K219 Gastro-esophageal reflux disease without esophagitis: Secondary | ICD-10-CM | POA: Diagnosis present

## 2024-03-05 DIAGNOSIS — O134 Gestational [pregnancy-induced] hypertension without significant proteinuria, complicating childbirth: Secondary | ICD-10-CM | POA: Diagnosis present

## 2024-03-05 DIAGNOSIS — Z7982 Long term (current) use of aspirin: Secondary | ICD-10-CM | POA: Diagnosis not present

## 2024-03-05 HISTORY — DX: Gestational (pregnancy-induced) hypertension without significant proteinuria, unspecified trimester: O13.9

## 2024-03-05 LAB — COMPREHENSIVE METABOLIC PANEL
ALT: 17 U/L (ref 0–44)
AST: 22 U/L (ref 15–41)
Albumin: 2.8 g/dL — ABNORMAL LOW (ref 3.5–5.0)
Alkaline Phosphatase: 150 U/L — ABNORMAL HIGH (ref 38–126)
Anion gap: 12 (ref 5–15)
BUN: 10 mg/dL (ref 6–20)
CO2: 17 mmol/L — ABNORMAL LOW (ref 22–32)
Calcium: 9.5 mg/dL (ref 8.9–10.3)
Chloride: 108 mmol/L (ref 98–111)
Creatinine, Ser: 0.69 mg/dL (ref 0.44–1.00)
GFR, Estimated: >60 mL/min (ref >60)
Glucose, Bld: 94 mg/dL (ref 70–99)
Potassium: 3.8 mmol/L (ref 3.5–5.1)
Sodium: 137 mmol/L (ref 135–145)
Total Bilirubin: 0.6 mg/dL (ref 0.0–1.2)
Total Protein: 6.2 g/dL — ABNORMAL LOW (ref 6.5–8.1)

## 2024-03-05 LAB — URINALYSIS, ROUTINE W REFLEX MICROSCOPIC
Bilirubin Urine: NEGATIVE
Glucose, UA: NEGATIVE mg/dL
Ketones, ur: NEGATIVE mg/dL
Leukocytes,Ua: NEGATIVE
Nitrite: NEGATIVE
Protein, ur: NEGATIVE mg/dL
Specific Gravity, Urine: 1.01 (ref 1.005–1.030)
pH: 6 (ref 5.0–8.0)

## 2024-03-05 LAB — PROTEIN / CREATININE RATIO, URINE
Creatinine, Urine: 119 mg/dL
Protein Creatinine Ratio: 0.16 mg/mg{creat} — ABNORMAL HIGH (ref 0.00–0.15)
Total Protein, Urine: 19 mg/dL

## 2024-03-05 LAB — CBC
HCT: 41 % (ref 36.0–46.0)
HCT: 40.7 % (ref 36.0–46.0)
Hemoglobin: 14.2 g/dL (ref 12.0–15.0)
Hemoglobin: 14 g/dL (ref 12.0–15.0)
MCH: 31 pg (ref 26.0–34.0)
MCH: 31 pg (ref 26.0–34.0)
MCHC: 34.6 g/dL (ref 30.0–36.0)
MCHC: 34.4 g/dL (ref 30.0–36.0)
MCV: 89.5 fL (ref 80.0–100.0)
MCV: 90 fL (ref 80.0–100.0)
Platelets: 265 K/uL (ref 150–400)
Platelets: 268 10*3/uL (ref 150–400)
RBC: 4.58 MIL/uL (ref 3.87–5.11)
RBC: 4.52 MIL/uL (ref 3.87–5.11)
RDW: 13.2 % (ref 11.5–15.5)
RDW: 13.2 % (ref 11.5–15.5)
WBC: 12.6 K/uL — ABNORMAL HIGH (ref 4.0–10.5)
WBC: 14.3 10*3/uL — ABNORMAL HIGH (ref 4.0–10.5)
nRBC: 0 % (ref 0.0–0.2)
nRBC: 0 % (ref 0.0–0.2)

## 2024-03-05 LAB — TYPE AND SCREEN
ABO/RH(D): O POS
Antibody Screen: NEGATIVE

## 2024-03-05 MED ORDER — ONDANSETRON HCL 4 MG/2ML IJ SOLN
4.0000 mg | Freq: Four times a day (QID) | INTRAMUSCULAR | Status: DC | PRN
  Administered 2024-03-05: 4 mg via INTRAVENOUS
  Filled 2024-03-05: qty 2

## 2024-03-05 MED ORDER — LIDOCAINE HCL (PF) 1 % IJ SOLN: 30.0000 mL | INTRAMUSCULAR | Status: DC | PRN

## 2024-03-05 MED ORDER — FLEET ENEMA RE ENEM: 1.0000 | ENEMA | RECTAL | Status: DC | PRN

## 2024-03-05 MED ORDER — LACTATED RINGERS IV SOLN: INTRAVENOUS | Status: DC

## 2024-03-05 MED ORDER — SODIUM CHLORIDE 0.9% FLUSH: 3.0000 mL | INTRAVENOUS | Status: DC | PRN

## 2024-03-05 MED ORDER — MISOPROSTOL 25 MCG QUARTER TABLET
25.0000 ug | ORAL_TABLET | ORAL | Status: DC
  Administered 2024-03-05: 25 ug via ORAL
  Filled 2024-03-05: qty 1

## 2024-03-05 MED ORDER — FENTANYL-BUPIVACAINE-NACL 0.5-0.125-0.9 MG/250ML-% EP SOLN: 12.0000 mL/h | EPIDURAL | Status: DC | PRN

## 2024-03-05 MED ORDER — FENTANYL CITRATE (PF) 100 MCG/2ML IJ SOLN
50.0000 ug | INTRAMUSCULAR | Status: DC | PRN
Start: 2024-03-05 — End: 2024-03-06

## 2024-03-05 MED ORDER — HYDROXYZINE HCL 50 MG PO TABS: 50.0000 mg | ORAL_TABLET | Freq: Four times a day (QID) | ORAL | Status: DC | PRN

## 2024-03-05 MED ORDER — OXYTOCIN-SODIUM CHLORIDE 30-0.9 UT/500ML-% IV SOLN
1.0000 m[IU]/min | INTRAVENOUS | Status: DC
  Administered 2024-03-05: 2 m[IU]/min via INTRAVENOUS

## 2024-03-05 MED ORDER — OXYCODONE-ACETAMINOPHEN 5-325 MG PO TABS: 2.0000 | ORAL_TABLET | ORAL | Status: DC | PRN

## 2024-03-05 MED ORDER — OXYTOCIN-SODIUM CHLORIDE 30-0.9 UT/500ML-% IV SOLN
2.5000 [IU]/h | INTRAVENOUS | Status: DC
  Filled 2024-03-05: qty 500

## 2024-03-05 MED ORDER — OXYCODONE-ACETAMINOPHEN 5-325 MG PO TABS: 1.0000 | ORAL_TABLET | ORAL | Status: DC | PRN

## 2024-03-05 MED ORDER — TERBUTALINE SULFATE 1 MG/ML IJ SOLN: 0.2500 mg | Freq: Once | INTRAMUSCULAR | Status: DC | PRN

## 2024-03-05 MED ORDER — PHENYLEPHRINE 80 MCG/ML (10ML) SYRINGE FOR IV PUSH (FOR BLOOD PRESSURE SUPPORT)
80.0000 ug | PREFILLED_SYRINGE | INTRAVENOUS | Status: DC | PRN
  Administered 2024-03-05: 80 ug via INTRAVENOUS
  Filled 2024-03-05: qty 10

## 2024-03-05 MED ORDER — LACTATED RINGERS IV SOLN: 500.0000 mL | INTRAVENOUS | Status: DC | PRN

## 2024-03-05 MED ORDER — SODIUM CHLORIDE 0.9% FLUSH
3.0000 mL | Freq: Two times a day (BID) | INTRAVENOUS | Status: DC
  Administered 2024-03-05: 3 mL via INTRAVENOUS

## 2024-03-05 MED ORDER — MAGNESIUM OXIDE -MG SUPPLEMENT 400 (240 MG) MG PO TABS
400.0000 mg | ORAL_TABLET | Freq: Every day | ORAL | Status: DC
  Administered 2024-03-05: 400 mg via ORAL
  Filled 2024-03-05 (×2): qty 1

## 2024-03-05 MED ORDER — LACTATED RINGERS IV SOLN: 500.0000 mL | Freq: Once | INTRAVENOUS | Status: DC

## 2024-03-05 MED ORDER — PHENYLEPHRINE 80 MCG/ML (10ML) SYRINGE FOR IV PUSH (FOR BLOOD PRESSURE SUPPORT): 80.0000 ug | PREFILLED_SYRINGE | INTRAVENOUS | Status: DC | PRN

## 2024-03-05 MED ORDER — SOD CITRATE-CITRIC ACID 500-334 MG/5ML PO SOLN: 30.0000 mL | ORAL | Status: DC | PRN

## 2024-03-05 MED ORDER — LACTATED RINGERS IV SOLN
500.0000 mL | Freq: Once | INTRAVENOUS | Status: AC
  Administered 2024-03-05: 250 mL via INTRAVENOUS

## 2024-03-05 MED ORDER — LIDOCAINE HCL (PF) 1 % IJ SOLN
INTRAMUSCULAR | Status: DC | PRN
  Administered 2024-03-05: 10 mL via EPIDURAL

## 2024-03-05 MED ORDER — ACETAMINOPHEN 325 MG PO TABS
650.0000 mg | ORAL_TABLET | ORAL | Status: DC | PRN
  Administered 2024-03-05: 650 mg via ORAL
  Filled 2024-03-05: qty 2

## 2024-03-05 MED ORDER — EPHEDRINE 5 MG/ML INJ: 10.0000 mg | INTRAVENOUS | Status: DC | PRN

## 2024-03-05 MED ORDER — SODIUM CHLORIDE 0.9 % IV SOLN: 250.0000 mL | INTRAVENOUS | Status: DC | PRN

## 2024-03-05 MED ORDER — DIPHENHYDRAMINE HCL 50 MG/ML IJ SOLN: 12.5000 mg | INTRAMUSCULAR | Status: DC | PRN

## 2024-03-05 MED ORDER — OXYTOCIN BOLUS FROM INFUSION: 333.0000 mL | Freq: Once | INTRAVENOUS | Status: DC

## 2024-03-05 MED ORDER — FENTANYL-BUPIVACAINE-NACL 0.5-0.125-0.9 MG/250ML-% EP SOLN
12.0000 mL/h | EPIDURAL | Status: DC | PRN
  Administered 2024-03-05: 12 mL/h via EPIDURAL
  Filled 2024-03-05: qty 250

## 2024-03-05 MED ORDER — METOCLOPRAMIDE HCL 10 MG PO TABS
10.0000 mg | ORAL_TABLET | Freq: Three times a day (TID) | ORAL | Status: DC | PRN
  Administered 2024-03-06: 10 mg via ORAL
  Filled 2024-03-05 (×2): qty 1

## 2024-03-05 NOTE — Anesthesia Procedure Notes (Signed)
 Epidural Patient location during procedure: OB Start time: 03/05/2024 9:07 PM End time: 03/05/2024 9:10 PM  Staffing Anesthesiologist: Lannie Fields, DO  Preanesthetic Checklist Completed: patient identified, IV checked, site marked, risks and benefits discussed, surgical consent, monitors and equipment checked, pre-op evaluation and timeout performed  Epidural Patient position: sitting Prep: DuraPrep and site prepped and draped Patient monitoring: continuous pulse ox and blood pressure Approach: midline Location: L1-L2 Injection technique: LOR air  Needle:  Needle type: Tuohy  Needle gauge: 17 G Needle length: 9 cm and 9 Needle insertion depth: 9 cm Catheter type: closed end flexible Catheter size: 19 Gauge Catheter at skin depth: 15 cm Test dose: negative  Assessment Events: blood not aspirated, no cerebrospinal fluid, injection not painful, no injection resistance, no paresthesia and negative IV test

## 2024-03-05 NOTE — Progress Notes (Signed)
 Labor Progress Note  S: Patient reports feeling comfortable with epidural.   O:  Vitals:   03/05/24 2201 03/05/24 2223  BP: (!) 132/118 (!) 109/59  Pulse:  (!) 111  Resp: 16   Temp:    SpO2:      SVE: 6.5/80/-2 by RN Causey @ 930p  EFM: baseline 145 bpm, moderate variability, + accels, - decels Toco: ctxs q49min  A/P:  28 y.o. G2P0010 at [redacted]w[redacted]d with gHTN, obesity, suspected fetal macrosomia admitted for IOL for gHTN. Patient in early labor.    #Labor: - S/p cytotec x1, foley balloon, AROM @ 1838 - Plan to start pitocin now at 6mu and uptitrate 2x2 as tolerated - Anticipate vaginal delivery    #Fetus - EFW: 97%, AC 99%, shoulder dystocia precautions - EFM: Cat I; resuscitate per protocol PRN    #Pain: - Epidural in situ   #GBS:  - Negative/-- (03/03 0000)   #gHTN - Baseline labs wnl, UPC neg - Mild range BP's; had episode of hypotension after epidural placement, received phenylephrine with good response; isolated severe range BP at 10p, likely iso cuff in wrong position, will re-check now.  - Intermittent headache, currently resolved  - Continue close monitoring of BP's and neuro symptoms   Marlene Bast, MD

## 2024-03-05 NOTE — MAU Note (Signed)
..  Robin Suarez is a 28 y.o. at [redacted]w[redacted]d here in MAU reporting: abdominal pain that started this morning. Pain has felt constant and is most pronounced in LLQ radiating towards left pelvis. Pt has not taken any medications. Per pt has felt some fetal movement this morning but less than usual. Denies leaking fluid or vaginal bleeding or discharge.   Pt has gestational hypertension, macrosomia. Has had a headache. Denies vision changes, RUQ pain. Has pitting edema in ankles.   Pain score: abdominal pain currently 5/10 was 8/10 earlier this morning. HA 2/10.  Vitals:   03/05/24 1218  BP: (!) 142/72  Pulse: (!) 105  Resp: 20  Temp: 98.4 F (36.9 C)     FHT:160 Lab orders placed from triage:  UA

## 2024-03-05 NOTE — MAU Provider Note (Signed)
 Event Date/Time   First Provider Initiated Contact with Patient 03/05/24 1312     S [name] is a 28 y.o. G2P0010 pregnant female at [redacted]w[redacted]d who presents to MAU today with complaint of left lower quadrant abdominal pain radiating into her pubic bone, this is not a new symptom but is worse than prior.   Also notes a mild headache that has been coming and going for the past weeks. Happens a lot when she's up and moving around and will get better if she sits down to rest. Told the RN she has gestational hypertension, but told CNM her Mds were "just watching my BP and put me on aspirin." No other physical complaints.  Receives care at [facility]. Prenatal records reviewed. Had two elevated BP (x2) on 02/09/24. Has an IOL scheduled for 3/19 "unless ruled in for [facility] prior."  Pertinent items noted in HPI and remainder of comprehensive ROS otherwise negative.   O Patient Vitals for the past 24 hrs:  BP Temp Temp src Pulse Resp SpO2  03/05/24 1315 135/86 -- -- (!) 101 20 99 %  03/05/24 1300 130/80 -- -- 100 20 98 %  03/05/24 1245 118/74 -- -- 99 -- 98 %  03/05/24 1230 126/78 -- -- (!) 101 20 --  03/05/24 1218 (!) 142/72 98.4 F (36.9 C) Oral (!) 105 20 --  03/05/24 1215 -- -- -- -- -- 99 %    Physical Exam Vitals and nursing note reviewed.  Constitutional:      General: She is not in acute distress.    Appearance: She is well-developed. She is obese. She is not ill-appearing.  Cardiovascular:     Rate and Rhythm: Normal rate and regular rhythm.  Pulmonary:     Effort: Pulmonary effort is normal.  Abdominal:     Tenderness: There is abdominal tenderness in the left lower quadrant.  Musculoskeletal:        General: Normal range of motion.     Right lower leg: Edema present.     Left lower leg: Edema present.  Skin:    General: Skin is warm and dry.     Capillary Refill: Capillary refill takes less than 2 seconds.  Neurological:     Mental Status: She is alert and  oriented to person, place, and time.  Psychiatric:        Mood and Affect: Mood normal.        Behavior: Behavior normal.    No results found for this or any previous visit (from the past 24 hours).  MDM: Moderate MAU Course: Met criteria for gestational hypertension on arrival to MAU with BP of 142/72. Returned to normotensive but with pt's description of headaches and edema, Dr. Para March (Faculty MD) agreed pt should be admitted for IOL r/t gHTN.  Report called to Dr. Wendall Stade who agreed pt should be admitted for induction. Preeclampsia labs and admission order placed for facilitation of transfer.  A G2P0010 at [redacted]w[redacted]d  Gestational hypertension  P Admit to L&D for IOL Care turned over to Dr. Colonel Bald, Judye Bos, CNM 03/05/2024 1:15 PM

## 2024-03-05 NOTE — Progress Notes (Signed)
 Labor Progress Note  S: Patient reports reports feeling contractions.   O:  Vitals:   03/05/24 1741 03/05/24 1821  BP: (!) 150/97 (!) 151/83  Pulse: (!) 105 (!) 101  Resp: 17   Temp:    SpO2:      SVE: 5/50/-2 AROM'd for large amount of clear fluid.   EFM: baseline 155 bpm, moderate variability, + accels, - decels Toco: ctxs q2-74min  A/P: 28 y.o. G2P0010 at [redacted]w[redacted]d with gHTN, obesity, suspected fetal macrosomia admitted for IOL for gHTN. Patient in early labor.   #Labor: - S/p cytotec x1, foley balloon, AROM @ 1838 - Plan to start pitocin now at 2mu and uptitrate 2x2 as tolerated - Anticipate vaginal delivery   #Fetus - EFW: 97%, AC 99% - EFM: Cat I; resuscitate per protocol PRN   #Pain: - Epidural - planning in next few hours  #GBS:  - Negative/-- (03/03 0000)  #gHTN - Baseline labs wnl, UPC neg - Mild range BP's - Intermittent headache, resolved after Tylenol, now returned - Will try mag ox and reglan - Continue close monitoring of BP's and neuro symptoms   Marlene Bast, MD

## 2024-03-05 NOTE — H&P (Signed)
 OB ADMISSION/ HISTORY & PHYSICAL:  Admission Date: 03/05/2024 11:32 AM  Admit Diagnosis: Gestational hypertension [O13.9]    Robin Suarez is a 28 y.o. female G2P0010 at [redacted]w[redacted]d with obesity, suspected fetal macrosomia presenting for pelvic pressure and headache. Patient with intermittent headaches in pregnancy, resolves with rest and Tylenol usually. Had episode of high blood pressures in clinic on  2/17 in mild range (x2 <4h apart), labs at that time within normal limits. No further elevated blood pressures in clinic, and patient had not previously met criteria for gHTN. Patient with elevated BP in MAU today to 142/72 now meeting criteria for gHTN. Denies visual changes, chest pain, shortness of breath, or RUQ pain. Stable lower extremity edema.   Prenatal History: G2P0010   EDC: 03/17/2024, Date entered prior to episode creation  Prenatal care at [facility] since 9w  Primary: Dr. Ernestina Penna  Prenatal course complicated by: Obesity (starting BMI 41) Recurrent vaginal candidiasis Suspected fetal macrosomia (EFW 3400g, 97% on 2/24, w/AC 99%)  Prenatal Labs: ABO, Rh:    Antibody: Negative (08/23 0000) Rubella: Immune (08/23 0000)  RPR: Nonreactive (08/23 0000)  HBsAg: Negative (08/23 0000)  HIV: Non-reactive (08/23 0000)  GBS: Negative/-- (03/03 0000)  1 hr Glucola : 88 Genetic Screening: Panorama LR XY Ultrasound: 3/11: Cephalic, AFI 21.6cm, anterior placenta. BPP 8/8.  2/24: EFW 97% with AC 99%.    Maternal Diabetes: No Genetic Screening: Normal Maternal Ultrasounds/Referrals: Other: suspected fetal macrosomia Fetal Ultrasounds or other Referrals:  None Maternal Substance Abuse:  No Significant Maternal Medications:  None Significant Maternal Lab Results:  Group B Strep negative Other Comments:  None  Medical / Surgical History : Past medical history:  Past Medical History:  Diagnosis Date   Anxiety and depression 11/11/2011   Chicken pox as a child   Contact  dermatitis 06/04/2012   TO NICKLE   Depression with anxiety 11/11/2011   Bipolar disorder screen is negative but borderline.     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   Migraine 04/16/2016   Nickel allergy    Obesity 11/11/2011   PT HAS LOST AT LEAST 100 LBS OVER PAST 9 TO 10 MONTHS-NO LONGER OBESE   Overactive bladder    NO LONGER A PROBLEM   PONV (postoperative nausea and vomiting)    NAUSEA AFTER ONE SURGERY AS A CHILD   Seasonal allergies 07/23/2021   Social anxiety disorder 11/11/2011   Vesico-ureteral reflux    BLADDER REFLUX AS A CHILD - NO PROBLEM NOW    Past surgical history:  Past Surgical History:  Procedure Laterality Date   broken arm  2002   left, repair of ulna and radius   CHOLECYSTECTOMY N/A 08/09/2014   Procedure: LAPAROSCOPIC CHOLECYSTECTOMY WITH INTRAOPERATIVE CHOLANGIOGRAM;  Surgeon: Atilano Ina, MD;  Location: [facility];  Service: General;  Laterality: N/A;   Tympanostomy with myringotomy Bilateral 1996   WISDOM TOOTH EXTRACTION  10/23/2012    Family History:  Family History  Problem Relation Age of Onset   Stroke Father    Heart disease Father    Depression Father    Diabetes Father        type 2   Other Father        muscular spasm of the heart   Hyperlipidemia Father    Heart attack Father    Hyperlipidemia Maternal Grandmother    Hypertension Maternal Grandmother    Diabetes Maternal Grandmother  type 2   Skin cancer Maternal Grandmother    Cancer Maternal Grandfather        laryngeal, alcohol and tobacco   Other Paternal Grandmother        arrythmia   Diabetes Paternal Grandmother        type 2   Pancreatic cancer Paternal Grandmother 59   Diabetes Paternal Grandfather        type 2   Hyperlipidemia Paternal Grandfather    Other Paternal Grandfather        fluid around the heart   Rheum arthritis Paternal Grandfather    Alzheimer's disease Paternal Grandfather      Social History:  reports that she has never smoked. She has never been exposed to tobacco smoke. She has never used smokeless tobacco. She reports current alcohol use. She reports that she does not use drugs.  Allergies: Cefprozil, Gardasil 9 [human papillomavirus 9-valent recombinant vaccine], Macrobid [nitrofurantoin], Nickel, and Sulfa antibiotics   Current Medications at time of admission:  Medications Prior to Admission  Medication Sig Dispense Refill Last Dose/Taking   aspirin 81 MG chewable tablet Chew by mouth daily.   Past Week   famotidine (PEPCID) 20 MG tablet Take 20 mg by mouth 2 (two) times daily.   03/04/2024   Prenatal Vit-Fe Fumarate-FA (MULTIVITAMIN-PRENATAL) 27-0.8 MG TABS tablet Take 1 tablet by mouth daily at 12 noon.   Past Week   Cyanocobalamin 1000 MCG SUBL 1 tab placed under the tongue daily. (Patient not taking: Reported on 12/10/2023) 90 tablet 3    metroNIDAZOLE (FLAGYL) 500 MG tablet Take 1 tablet (500 mg total) by mouth 2 (two) times daily. 14 tablet 0    pantoprazole (PROTONIX) 40 MG tablet Take 1 tablet (40 mg total) by mouth daily. 90 tablet 1      Review of Systems: ROS  Physical Exam: Vital signs and nursing notes reviewed.  Patient Vitals for the past 24 hrs:  BP Temp Temp src Pulse Resp SpO2 Height Weight  03/05/24 1334 129/82 -- -- (!) 108 18 -- 5\' 2"  (1.575 m) (!) 138.3 kg  03/05/24 1320 -- -- -- -- -- 99 % -- --  03/05/24 1315 135/86 -- -- (!) 101 20 99 % -- --  03/05/24 1300 130/80 -- -- 100 20 98 % -- --  03/05/24 1245 118/74 -- -- 99 -- 98 % -- --  03/05/24 1230 126/78 -- -- (!) 101 20 -- -- --  03/05/24 1218 (!) 142/72 98.4 F (36.9 C) Oral (!) 105 20 -- -- --  03/05/24 1215 -- -- -- -- -- 99 % -- --     General: AAO x 3, NAD Heart: RRR Lungs:CTAB Abdomen: Gravid, NT Extremities: +1 non-pitting edema SVE: Dilation: 2 Effacement (%): 30 Station: -3 Presentation: Vertex Exam by:: Dr. Gates Rigg  Foley balloon passed through cervix  and inflated with 60cc water. Catheter taped to leg with gentle traction.   FHR: 145 BPM, moderate variability, + accels, -- decels TOCO: Rare contractions   Labs:   Labs pending  Assessment/Plan: 28 y.o. G2P0010 at [redacted]w[redacted]d with suspected fetal macrosomia (EFW 97%, AC 99%), obesity (BMI 56), and newly diagnosed gHTN by mild range elevated BP's >4h apart. Preelcampsia labs last done in clinic on 2/24 and within normal limits with negative UPC. Repeat labs pending. Will continue to closely monitor BP's and symptoms. Will try tylenol for headache and continue to monitor neuro symptoms. Labor dystocia and shoulder dystocia elevated risks iso suspected fetal macrosomia reviewed  with patient at diagnosis at 32w with continued review of diagnosis at prenatal visits.   Fetal wellbeing - FHT category 1 EFW 3800g by LM (3400g on U/S 2/24)  Labor: IOL w/foley balloon in situ and buccal cytotec ; plan to start pitocin after cytotec x1; AROM when balloon out   GBS negative Rubella immune Rh pos  Pain control: unsure about epidural Analgesia/anesthesia PRN  Anticipated MOD: Vaginal delivery, shoulder dystocia precautions   POC discussed with patient and support team, all questions answered.  Antony Salmon D'iorio, MD 03/05/2024, 2:03 PM

## 2024-03-05 NOTE — Anesthesia Preprocedure Evaluation (Signed)
 Anesthesia Evaluation  Patient identified by MRN, date of birth, ID band Patient awake    Reviewed: Allergy & Precautions, H&P , NPO status , Patient's Chart, lab work & pertinent test results, reviewed documented beta blocker date and time   History of Anesthesia Complications (+) PONV and history of anesthetic complications  Airway Mallampati: I  TM Distance: >3 FB Neck ROM: full    Dental no notable dental hx. (+) Teeth Intact, Dental Advisory Given   Pulmonary neg pulmonary ROS   Pulmonary exam normal breath sounds clear to auscultation       Cardiovascular hypertension, negative cardio ROS Normal cardiovascular exam Rhythm:regular Rate:Normal     Neuro/Psych  Headaches PSYCHIATRIC DISORDERS Anxiety Depression    negative neurological ROS  negative psych ROS   GI/Hepatic negative GI ROS, Neg liver ROS,GERD  ,,  Endo/Other    Class 4 obesityClass 3 obesity  Renal/GU negative Renal ROS  negative genitourinary   Musculoskeletal   Abdominal   Peds  Hematology negative hematology ROS (+)   Anesthesia Other Findings   Reproductive/Obstetrics (+) Pregnancy                             Anesthesia Physical Anesthesia Plan  ASA: 3  Anesthesia Plan: Epidural   Post-op Pain Management: Minimal or no pain anticipated   Induction:   PONV Risk Score and Plan: 4 or greater and Treatment may vary due to age or medical condition  Airway Management Planned: Natural Airway and Simple Face Mask  Additional Equipment: None  Intra-op Plan:   Post-operative Plan:   Informed Consent: I have reviewed the patients History and Physical, chart, labs and discussed the procedure including the risks, benefits and alternatives for the proposed anesthesia with the patient or authorized representative who has indicated his/her understanding and acceptance.       Plan Discussed with:  Anesthesiologist  Anesthesia Plan Comments:        Anesthesia Quick Evaluation

## 2024-03-06 ENCOUNTER — Encounter (HOSPITAL_COMMUNITY): Payer: Self-pay | Admitting: Obstetrics

## 2024-03-06 LAB — CBC
HCT: 36.5 % (ref 36.0–46.0)
Hemoglobin: 12.6 g/dL (ref 12.0–15.0)
MCH: 31.2 pg (ref 26.0–34.0)
MCHC: 34.5 g/dL (ref 30.0–36.0)
MCV: 90.3 fL (ref 80.0–100.0)
Platelets: 234 10*3/uL (ref 150–400)
RBC: 4.04 MIL/uL (ref 3.87–5.11)
RDW: 13.2 % (ref 11.5–15.5)
WBC: 18.8 10*3/uL — ABNORMAL HIGH (ref 4.0–10.5)
nRBC: 0 % (ref 0.0–0.2)

## 2024-03-06 LAB — RPR: RPR Ser Ql: NONREACTIVE

## 2024-03-06 MED ORDER — BENZOCAINE-MENTHOL 20-0.5 % EX AERO
1.0000 | INHALATION_SPRAY | CUTANEOUS | Status: DC | PRN
  Filled 2024-03-06: qty 56

## 2024-03-06 MED ORDER — SENNOSIDES-DOCUSATE SODIUM 8.6-50 MG PO TABS
2.0000 | ORAL_TABLET | Freq: Every day | ORAL | Status: DC
  Administered 2024-03-07: 2 via ORAL
  Filled 2024-03-06: qty 2

## 2024-03-06 MED ORDER — PRENATAL MULTIVITAMIN CH
1.0000 | ORAL_TABLET | Freq: Every day | ORAL | Status: DC
  Administered 2024-03-07: 1 via ORAL
  Filled 2024-03-06: qty 1

## 2024-03-06 MED ORDER — ONDANSETRON HCL 4 MG/2ML IJ SOLN: 4.0000 mg | INTRAMUSCULAR | Status: DC | PRN

## 2024-03-06 MED ORDER — WITCH HAZEL-GLYCERIN EX PADS: 1.0000 | MEDICATED_PAD | CUTANEOUS | Status: DC | PRN

## 2024-03-06 MED ORDER — DIPHENHYDRAMINE HCL 25 MG PO CAPS: 25.0000 mg | ORAL_CAPSULE | Freq: Four times a day (QID) | ORAL | Status: DC | PRN

## 2024-03-06 MED ORDER — POLYETHYLENE GLYCOL 3350 17 G PO PACK
17.0000 g | PACK | Freq: Every day | ORAL | Status: DC
  Administered 2024-03-07: 17 g via ORAL
  Filled 2024-03-06: qty 1

## 2024-03-06 MED ORDER — OXYTOCIN-SODIUM CHLORIDE 30-0.9 UT/500ML-% IV SOLN
2.5000 [IU]/h | INTRAVENOUS | Status: DC
  Administered 2024-03-06: 2.5 [IU]/h via INTRAVENOUS

## 2024-03-06 MED ORDER — SIMETHICONE 80 MG PO CHEW: 80.0000 mg | CHEWABLE_TABLET | ORAL | Status: DC | PRN

## 2024-03-06 MED ORDER — ONDANSETRON HCL 4 MG PO TABS: 4.0000 mg | ORAL_TABLET | ORAL | Status: DC | PRN

## 2024-03-06 MED ORDER — ACETAMINOPHEN 500 MG PO TABS
1000.0000 mg | ORAL_TABLET | Freq: Four times a day (QID) | ORAL | Status: DC
  Administered 2024-03-06 – 2024-03-07 (×5): 1000 mg via ORAL
  Filled 2024-03-06 (×5): qty 2

## 2024-03-06 MED ORDER — IBUPROFEN 600 MG PO TABS
600.0000 mg | ORAL_TABLET | Freq: Four times a day (QID) | ORAL | Status: DC
  Administered 2024-03-06 – 2024-03-07 (×5): 600 mg via ORAL
  Filled 2024-03-06 (×5): qty 1

## 2024-03-06 MED ORDER — FAMOTIDINE 20 MG PO TABS
20.0000 mg | ORAL_TABLET | Freq: Two times a day (BID) | ORAL | Status: DC
  Administered 2024-03-06 – 2024-03-07 (×2): 20 mg via ORAL
  Filled 2024-03-06 (×2): qty 1

## 2024-03-06 MED ORDER — DIBUCAINE (PERIANAL) 1 % EX OINT: 1.0000 | TOPICAL_OINTMENT | CUTANEOUS | Status: DC | PRN

## 2024-03-06 MED ORDER — OXYTOCIN BOLUS FROM INFUSION
333.0000 mL | Freq: Once | INTRAVENOUS | Status: AC
  Administered 2024-03-06: 333 mL via INTRAVENOUS

## 2024-03-06 MED ORDER — COCONUT OIL OIL: 1.0000 | TOPICAL_OIL | Status: DC | PRN

## 2024-03-06 NOTE — Anesthesia Postprocedure Evaluation (Signed)
 Anesthesia Post Note  Patient: Robin Suarez  Procedure(s) Performed: AN AD HOC LABOR EPIDURAL     Patient location during evaluation: Mother Baby Anesthesia Type: Epidural Level of consciousness: awake and alert Pain management: pain level controlled Vital Signs Assessment: post-procedure vital signs reviewed and stable Respiratory status: spontaneous breathing, nonlabored ventilation and respiratory function stable Cardiovascular status: stable Postop Assessment: no headache, no backache and epidural receding Anesthetic complications: no   No notable events documented.  Last Vitals:  Vitals:   03/06/24 1033 03/06/24 1500  BP: 124/68 128/79  Pulse: (!) 110 100  Resp: 18 16  Temp: 36.9 C 37 C  SpO2: 98% 98%    Last Pain:  Vitals:   03/06/24 1500  TempSrc: Oral  PainSc: 0-No pain   Pain Goal: Patients Stated Pain Goal: 1 (03/05/24 1415)                 Erminio Nygard

## 2024-03-06 NOTE — Lactation Note (Signed)
 This note was copied from a baby's chart. Lactation Consultation Note  Patient Name: Robin Suarez [id number] Date: 03/06/2024 Age:30 hours Reason for consult: Initial assessment;1st time breastfeeding;Early term 37-38.6wks (See MOB;MR -GHTN).  Per MOB, she wants to BF infant this is her 1st time latching infant at the breast. MOB made multiples attempts was fitted with NS due to infant having bottle and MOB nipples were flat. MOB fitted with 20 mm NS, that was pre-filled with 0.5 mls of 20 kcal formula. MOB latched infant on her right breast using the football hold position with pillow support, infant sustained latch and BF for 16 minutes. Afterwards FOB supplemented infant with 10 mls of formula while MOB use DEBP, LC fitted MOB with 18 mm breast flange. MOB was expressing drops of colostrum in flanges as LC left the room.  LC discussed infant's input and output, infant had 3 voids and one stool since birth. MOB was  made aware of O/P services, breastfeeding support groups, community resources, and our phone # for post-discharge questions.    MOB knows that her EBM is safe for 4 hours at room temperature whereas, formula once open must be used within 1 hour.  Current feeding plan: 1- MOB will apply 20 mm NS, then latch infant 1st for every feeding by cues, on demand, every 2-3 hours, skin to skin. 2- MOB knows to ask for further latch assistance or help with applying NS. 3- MOB will continue to supplement infant with any EBM first and then formula. MOB has handout "Breastfeeding Supplementation." 4- MOB will continue to use DEBP every 3 hours for 15 minutes on initial setting.  Maternal Data Does the patient have breastfeeding experience prior to this delivery?: No  Feeding Mother's Current Feeding Choice: Breast Milk and Formula  LATCH Score Latch: Grasps breast easily, tongue down, lips flanged, rhythmical sucking.  Audible Swallowing: A few with stimulation  Type of Nipple:  Flat  Comfort (Breast/Nipple): Soft / non-tender  Hold (Positioning): Assistance needed to correctly position infant at breast and maintain latch.  LATCH Score: 7   Lactation Tools Discussed/Used Tools: Pump;Flanges Flange Size: 18 Breast pump type: Double-Electric Breast Pump Pump Education: Setup, frequency, and cleaning;Milk Storage Reason for Pumping: MOB is using 20 mm NS Pumping frequency: MOB will continue to use DEBPevery 3 hours for 15 minutes on inital setting.  Interventions Interventions: Breast feeding basics reviewed;Assisted with latch;Skin to skin;Breast compression;Adjust position;Support pillows;Position options;Education;Hand pump;DEBP;Pace feeding;CDC milk storage guidelines;CDC Guidelines for Breast Pump Cleaning;LC Services brochure;Guidelines for Milk Supply and Pumping Schedule Handout  Discharge Pump: DEBP;Personal  Consult Status Consult Status: Follow-up Date: 03/07/24 Follow-up type: In-patient    Frederico Hamman 03/06/2024, 11:14 PM

## 2024-03-07 LAB — COMPREHENSIVE METABOLIC PANEL
ALT: 14 U/L (ref 0–44)
AST: 20 U/L (ref 15–41)
Albumin: 2 g/dL — ABNORMAL LOW (ref 3.5–5.0)
Alkaline Phosphatase: 108 U/L (ref 38–126)
Anion gap: 6 (ref 5–15)
BUN: 13 mg/dL (ref 6–20)
CO2: 22 mmol/L (ref 22–32)
Calcium: 8.9 mg/dL (ref 8.9–10.3)
Chloride: 110 mmol/L (ref 98–111)
Creatinine, Ser: 0.95 mg/dL (ref 0.44–1.00)
GFR, Estimated: >60 mL/min (ref >60)
Glucose, Bld: 125 mg/dL — ABNORMAL HIGH (ref 70–99)
Potassium: 3.7 mmol/L (ref 3.5–5.1)
Sodium: 138 mmol/L (ref 135–145)
Total Bilirubin: 0.5 mg/dL (ref 0.0–1.2)
Total Protein: 4.7 g/dL — ABNORMAL LOW (ref 6.5–8.1)

## 2024-03-07 LAB — CBC
HCT: 29.6 % — ABNORMAL LOW (ref 36.0–46.0)
Hemoglobin: 10 g/dL — ABNORMAL LOW (ref 12.0–15.0)
MCH: 31.3 pg (ref 26.0–34.0)
MCHC: 33.8 g/dL (ref 30.0–36.0)
MCV: 92.8 fL (ref 80.0–100.0)
Platelets: 200 10*3/uL (ref 150–400)
RBC: 3.19 MIL/uL — ABNORMAL LOW (ref 3.87–5.11)
RDW: 13.7 % (ref 11.5–15.5)
WBC: 12.6 10*3/uL — ABNORMAL HIGH (ref 4.0–10.5)
nRBC: 0 % (ref 0.0–0.2)

## 2024-03-07 LAB — BIRTH TISSUE RECOVERY COLLECTION (PLACENTA DONATION)

## 2024-03-07 MED ORDER — ACETAMINOPHEN 500 MG PO TABS: 1000.0000 mg | ORAL_TABLET | Freq: Four times a day (QID) | ORAL | Status: AC

## 2024-03-07 MED ORDER — IBUPROFEN 200 MG PO TABS: 600.0000 mg | ORAL_TABLET | Freq: Four times a day (QID) | ORAL | Status: AC

## 2024-03-07 MED ORDER — POLYETHYLENE GLYCOL 3350 17 G PO PACK: 17.0000 g | PACK | Freq: Every day | ORAL | Status: DC

## 2024-03-07 NOTE — Discharge Instructions (Signed)
 Congratulations! We hope you have a wonderful postpartum period. We will plan to see you in the office in 2-3 days for BP check and 6 weeks. Please call the office if you experience fevers (>100.4 F), heavy vaginal bleeding (using >2 pads/hour), severe pain not responsive to ibuprofen (Advil or Motrin) and acetaminophen (Tylenol), chest pain, shortness of breath, or if you have pain, redness, or swelling in one or both legs. Mood swings, fatigue, and feeling down can be normal in the first two weeks following birth. If you feel your mood symptoms are severe or lasting longer than two weeks, please call our office. If you have any thoughts of hurting yourself or others please call our office. Please call your pediatrician if you have any concerns about your baby.   Please take your blood pressure twice a day, morning and evening, and record your results. Please call the office if your blood pressure is >160 on the top number or >110 on the bottom number. Please also call the office if you experience headaches not responsive to medication, flashing lights in your vision, chest pain, shortness of breath, right upper quadrant pain, or new/worsening/severe lower leg swelling.

## 2024-03-07 NOTE — Discharge Summary (Signed)
 Postpartum Discharge Summary  Patient Name: Robin Suarez DOB: July 16, 1996 MRN: [id number]  Date of admission: 03/05/2024 Delivery date:03/06/2024 Delivering provider: D'IORIO, Byrd Hesselbach A Date of discharge: 03/07/2024  Admitting diagnosis: Gestational hypertension [O13.9] Normal labor [O80, Z37.9] Intrauterine pregnancy: [redacted]w[redacted]d     Secondary diagnosis:  Principal Problem:   Gestational hypertension Active Problems:   Vaginal delivery   Normal labor  Additional problems: None    Discharge diagnosis: Term Pregnancy Delivered and Gestational Hypertension                                              Post partum procedures: none Augmentation: AROM, Pitocin, Cytotec, and IP Foley Complications: None  Hospital course: Induction of Labor With Vaginal Delivery   28 y.o. yo G2P1011 at [redacted]w[redacted]d was admitted to the hospital 03/05/2024 for induction of labor.  Indication for induction: Gestational hypertension.  Patient had an uncomplicated labor course. Membrane Rupture Time/Date: 6:38 PM,03/05/2024  Delivery Method:Vaginal, Spontaneous Operative Delivery:N/A Episiotomy: None Lacerations:  2nd degree;Perineal;Labial;1st degree Details of delivery can be found in separate delivery note.  Patient had a postpartum course complicated by none. Patient is discharged home 03/07/24.  Newborn Data: Birth date:03/06/2024 Birth time:7:17 AM Gender:Female Living status:Living Apgars:8 ,9  Weight:3980 g  Magnesium Sulfate received: No BMZ received: No Rhophylac:N/A MMR:N/A T-DaP:Given prenatally Flu: No RSV Vaccine received: No Transfusion:No Immunizations administered: Immunization History  Administered Date(s) Administered   DTaP 12/12/1994, 02/10/1995, 04/17/1995, 01/16/1996, 05/02/2000   HIB (PRP-OMP) 12/12/1994, 02/10/1995, 04/17/1995, 01/16/1996   Hepatitis B 1994[birth year]03[birth year]09, 12/12/1994, 04/17/1995   IPV 12/12/1994, 02/10/1995, 04/17/1995, 05/02/2000   Influenza Split 09/04/2012    Influenza,inj,Quad PF,6+ Mos 11/27/2018, 08/29/2019, 08/29/2019   Influenza-Unspecified 11/01/2013, 10/06/2020, 10/07/2023   MMR 01/16/1996, 05/02/2000   Tdap 11/11/2011    Physical exam  Vitals:   03/06/24 1924 03/06/24 2326 03/07/24 0515 03/07/24 1418  BP: 117/62 (!) 107/53 106/71 110/64  Pulse: 97 79 82 81  Resp: 17 17 17 17   Temp: 98.4 F (36.9 C)  98 F (36.7 C) 98.2 F (36.8 C)  TempSrc: Oral  Oral Oral  SpO2: 97% 98% 95% 98%  Weight:      Height:       General: alert, cooperative, and no distress Lochia: appropriate Uterine Fundus: firm Incision: N/A DVT Evaluation: No evidence of DVT seen on physical exam. Labs: Lab Results  Component Value Date   WBC 12.6 (H) 03/07/2024   HGB 10.0 (L) 03/07/2024   HCT 29.6 (L) 03/07/2024   MCV 92.8 03/07/2024   PLT 200 03/07/2024      Latest Ref Rng & Units 03/07/2024    5:20 AM  CMP  Glucose 70 - 99 mg/dL 409   BUN 6 - 20 mg/dL 13   Creatinine 8.11 - 1.00 mg/dL 9.14   Sodium 782 - 956 mmol/L 138   Potassium 3.5 - 5.1 mmol/L 3.7   Chloride 98 - 111 mmol/L 110   CO2 22 - 32 mmol/L 22   Calcium 8.9 - 10.3 mg/dL 8.9   Total Protein 6.5 - 8.1 g/dL 4.7   Total Bilirubin 0.0 - 1.2 mg/dL 0.5   Alkaline Phos 38 - 126 U/L 108   AST 15 - 41 U/L 20   ALT 0 - 44 U/L 14    Edinburgh Score:    03/06/2024    7:24 PM  Edinburgh Postnatal Depression Scale  Screening Tool  I have been able to laugh and see the funny side of things. 0  I have looked forward with enjoyment to things. 0  I have blamed myself unnecessarily when things went wrong. 0  I have been anxious or worried for no good reason. 0  I have felt scared or panicky for no good reason. 0  Things have been getting on top of me. 0  I have been so unhappy that I have had difficulty sleeping. 0  I have felt sad or miserable. 0  I have been so unhappy that I have been crying. 0  The thought of harming myself has occurred to me. 0  Edinburgh Postnatal Depression Scale  Total 0      After visit meds:  Allergies as of 03/07/2024       Reactions   Cefprozil Other (See Comments)   RASH HEAD TO TOE   Gardasil 9 [human Papillomavirus 9-valent Recombinant Vaccine]    Reaction to 2nd injection. Hives. Throat symptoms.    Macrobid [nitrofurantoin] Nausea And Vomiting   Nickel    Rash with contact   Sulfa Antibiotics    NAUSEA, GI UPSET        Medication List     TAKE these medications    acetaminophen 500 MG tablet Commonly known as: TYLENOL Take 2 tablets (1,000 mg total) by mouth every 6 (six) hours for 7 days.   famotidine 20 MG tablet Commonly known as: PEPCID Take 20 mg by mouth 2 (two) times daily.   ibuprofen 200 MG tablet Commonly known as: Advil Take 3 tablets (600 mg total) by mouth every 6 (six) hours for 7 days.   polyethylene glycol 17 g packet Commonly known as: MiraLax Take 17 g by mouth daily.         Discharge home in stable condition Infant Feeding: Bottle and Breast Infant Disposition:home with mother Discharge instruction: per After Visit Summary and Postpartum booklet. Activity: Advance as tolerated. Pelvic rest for 6 weeks.  Diet: routine diet Anticipated Birth Control: Unsure Postpartum Appointment:6 weeks Additional Postpartum F/U: BP check 2-3 days Future Appointments:No future appointments. Follow up Visit:  Follow-up Information     Obgyn, [place]. Schedule an appointment as soon as possible for a visit.   Why: within the week for BP check, in 6 weeks for postpartum visit Contact information: [address] [place] [address] [phone]                     03/07/2024 Edger House, MD

## 2024-03-07 NOTE — Lactation Note (Signed)
 This note was copied from a baby's chart. Lactation Consultation Note  Patient Name: Robin Suarez [id number] Date: 03/07/2024 Age:30 hours Reason for consult: Follow-up assessment;Early term 37-38.6wks;Primapara;1st time breastfeeding;Difficult latch  P1, 38 wks, @ 28 hrs of life. Per mom- has not pumped recently- intimidated after washing parts- put everything back together for mom. Re-enforced how to run cycle/ pick strength- provided coconut oil- encouraged to use small amount with pumping. Discussed steps of latching, using hand expression and breast compression during feed to keep infant interested. Encouraged pre-pumping a cycle and bringing baby to breast may be preferable to shield use with pumping after. New supplies provided/ questions addressed.  Discussed expectations @ breast- Day 1- sleepy/ feed every 3 hrs/ even 10 minutes is okay, Day 2 more awake/ feeding cues/longer feeds, and cluster feeding overnights brings milk in. Highlighted breast stimulation is tied directly to milk production. Discussed hands on breast and baby, keeping baby awake @ breast. Starting with hand expression & breast compression to get baby working @ breast, and gentle stimulation to keep baby working @ breast.  Encouraged mom to call for assist anytime desired- encouraged to call with next pump/feeding session.  Maternal Data Does the patient have breastfeeding experience prior to this delivery?: No  Feeding Mother's Current Feeding Choice: Breast Milk and Formula Nipple Type: Slow - flow  Lactation Tools Discussed/Used Pumping frequency: Encouraged mom to pump every 3 hrs for breast stimulation- to bring in milk supply  Interventions Interventions: Hand express;Pre-pump if needed;Breast compression;Expressed milk;Coconut oil;DEBP;Education  Discharge    Consult Status Consult Status: Follow-up Date: 03/07/24 Follow-up type: In-patient    [facility] 03/07/2024, 11:55 AM

## 2024-03-07 NOTE — Progress Notes (Signed)
 CSW received consult for hx of Anxiety and Depression. CSW met with MOB to offer support and complete assessment. When CSW entered room, MOB was observed sitting in hospital bed. Infant was in central nursery for circumcision. FOB and MOB's parents were present in room. CSW introduced self and requested to speak with MOB alone. Visitors left room. CSW explained reason for visit. MOB presented as calm, was agreeable to CSW visit, and remained engaged during consult.   CSW inquired how MOB is feeling emotionally since infant's arrival. MOB shared that she is feeling "the happiest (she's) ever been", expressing her love for infant. CSW expressed congratulations. CSW inquired about MOB's mental health history. MOB acknowledged a history of depression and anxiety, stating that she endorsed symptoms of anxiety and depression while she was in high school but denies symptoms as an adult. MOB reports as a teenager, she was prescribed Celexa, which she recalled heightened her anxiety. MOB states she met with a therapist before becoming pregnant to improve motivation and attended 3-4 visits. MOB denied endorsing mental health symptoms during pregnancy or since infant's arrival. MOB noted that she was diagnosed with gestational hypertension during pregnancy, which was "a little hard" but felt she was able to manage symptoms without taking medication and overall had a good pregnancy. MOB reports she has a "big" support network, and identified FOB, her parents, and extended family and friends as supports.MOB denied current SI/HI/DV.  CSW provided education regarding the baby blues period vs. perinatal mood disorders, discussed treatment and gave resources for mental health follow up if concerns arise.  CSW recommends self-evaluation during the postpartum time period using the New Mom Checklist from Postpartum Progress and encouraged MOB to contact a medical professional if symptoms are noted at any time.    MOB reports she  has all needed items for infant, including a car seat and bassinet. MOB has chosen [facility] for infant's follow up care.  CSW provided review of Sudden Infant Death Syndrome (SIDS) precautions.    CSW identifies no further need for intervention and no barriers to discharge at this time.  Signed,  Norberto Sorenson, MSW, LCSWA, LCASA 03/07/2024 5:11 PM

## 2024-03-07 NOTE — Progress Notes (Signed)
 Postpartum Progress Note  PPD#1 s/p SVD  S: Patient seen and examined at bedside. Reports feeling overall well. Pain well controlled, ambulating, tolerating regular diet, voiding without issue. Passing flatus, has not yet had a bowel movement. Denies fever, chills, chest pain, shortness of breath.  Feeding: plans to breastfeed, plans to bottle feed Circ: Desires circumcision  O:  Vitals:   03/06/24 2326 03/07/24 0515  BP: (!) 107/53 106/71  Pulse: 79 82  Resp: 17 17  Temp:  98 F (36.7 C)  SpO2: 98% 95%    PE:  GA: well appearing, NAD CV: RRR, normal S1, S2 Lungs: CTAB Abd: soft, appropriately tender, fundus firm below umbilicus Peri: moderate lochia Ext: no TTP, +1 non-pitting edema  Labs:  Lab Results  Component Value Date   WBC 12.6 (H) 03/07/2024   HGB 10.0 (L) 03/07/2024   HCT 29.6 (L) 03/07/2024   MCV 92.8 03/07/2024   PLT 200 03/07/2024   Lab Results  Component Value Date   CREATININE 0.95 03/07/2024    A/P:  28 y.o.yo G2P1011 PPD#1 s/p SVD (EBL 467 mL) with gHTN, doing well and progressing appropriately. Vitals within normal limits. Physical exam benign. Labs normal. Plan as follows:   #Routine OB - Regular diet, HLIV - ERAS for pain control  - Rh positive - DVT ppx: ambulating - BCM: Unsure  #Neonate - Desires circumcision -plans to breastfeed, plans to bottle feed   Plan for discharge home today.   Marlene Bast, MD

## 2024-03-10 ENCOUNTER — Inpatient Hospital Stay (HOSPITAL_COMMUNITY)

## 2024-03-10 ENCOUNTER — Inpatient Hospital Stay (HOSPITAL_COMMUNITY): Admission: RE | Admit: 2024-03-10 | Source: Ambulatory Visit | Admitting: Obstetrics

## 2024-03-16 ENCOUNTER — Telehealth (HOSPITAL_COMMUNITY): Payer: Self-pay | Admitting: *Deleted

## 2024-03-16 NOTE — Telephone Encounter (Signed)
 Attempted hospital discharge follow-up call. Left message for patient to return RN call with any questions or concerns. Deforest Hoyles, RN, 03/16/24, [phone]

## 2024-03-19 MED FILL — Oxytocin Inj 10 Unit/ML: INTRAMUSCULAR | Qty: 3 | Status: AC

## 2024-05-04 ENCOUNTER — Ambulatory Visit: Admitting: Family Medicine

## 2024-05-05 ENCOUNTER — Ambulatory Visit: Admitting: Family Medicine

## 2024-05-12 ENCOUNTER — Ambulatory Visit (INDEPENDENT_AMBULATORY_CARE_PROVIDER_SITE_OTHER): Admitting: Family Medicine

## 2024-05-12 ENCOUNTER — Encounter: Payer: Self-pay | Admitting: Family Medicine

## 2024-05-12 DIAGNOSIS — R635 Abnormal weight gain: Secondary | ICD-10-CM

## 2024-05-12 DIAGNOSIS — Z6841 Body Mass Index (BMI) 40.0 and over, adult: Secondary | ICD-10-CM | POA: Diagnosis not present

## 2024-05-12 NOTE — Patient Instructions (Addendum)
 If you are interested in weight loss counseling please make appt to discuss and bring with you 2 weeks of a food diary/log on notebook paper.  Food log is mandatory on first appt to proceed with counseling.  Weight loss counseling encompasses diet, exercise and can include  medications when appropriate and affordable.  There are routine appts for check-ins and weights to track progress and keep you on track.  Routine check-ins (in person) are also mandatory to continue with prescription refills. Check-in timeline  can range from 4 weeks to 12 weeks, depending on physician's recommendations and which step you are in of your weight loss journey.  Your BMI today is Body mass index is 56.88 kg/m.  Weight loss medications.   Wegovy or zepbound  Please check with your insurance prior to appt and ask them if they cover weight loss medications for your BMI? And if so, which medications. They may tell you some of the diabetes meds that are used for weight loss also,  are on your formulary- but this does not mean they are covered for weight loss only.  Even if they tell with a prior auth it is covered- make sure they check to see if you personally meet criteria with your BMI.

## 2024-05-12 NOTE — Progress Notes (Signed)
 Robin Suarez , 1996/10/26, 28 y.o., female MRN: [id number] Patient Care Team    Relationship Specialty Notifications Start End  Mariel Shope, DO PCP - General Family Medicine  05/12/24   Lenise Quince, MD PCP - Cardiology Cardiology  02/13/22   Obgyn, Corbin Dess    01/30/22     Chief Complaint  Patient presents with   Weight Gain    Pt states while pregnant she has gained 100lbs or more; increased appetite. Was recommended at GLP-1 by GYN. Pt looking for second opinion.      Subjective: Robin Suarez is a 28 y.o. Pt presents for an OV to discuss her obesity.  Patient recently gave birth in March 2025.  She states she gained close to 100 pounds during her pregnancy.  She has been working out and eating healthy since she had the baby but is unable to lose any weight.  She is not breast-feeding.  She is interested in having labs completed today surrounding her weight gain and obesity and discuss weight loss counseling.  lbs: 311 BMI: 56.88     10/10/2023    8:41 AM 06/11/2023    1:11 PM 03/14/2023   10:43 AM 01/15/2022    1:14 PM 07/20/2021    3:11 PM  Depression screen PHQ 2/9  Decreased Interest 1 2 1  0 0  Down, Depressed, Hopeless 1 2 1  0 0  PHQ - 2 Score 2 4 2  0 0  Altered sleeping 0 1 1 1  0  Tired, decreased energy 1 3 1 1 1   Change in appetite 0 1 0 0 0  Feeling bad or failure about yourself  1 2 1  0 0  Trouble concentrating 0 0 0 0 0  Moving slowly or fidgety/restless 0 0 0 0 0  Suicidal thoughts 0 0 0 0 0  PHQ-9 Score 4 11 5 2 1   Difficult doing work/chores Not difficult at all Somewhat difficult       Allergies  Allergen Reactions   Cefprozil Other (See Comments)    RASH HEAD TO TOE   Gardasil 9 [Human Papillomavirus 9-Valent Recombinant Vaccine]     Reaction to 2nd injection. Hives. Throat symptoms.    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   Migraine 04/16/2016   Nickel allergy    Obesity 11/11/2011   PT HAS LOST AT LEAST 100 LBS OVER PAST 9 TO 10 MONTHS-NO LONGER OBESE   Overactive bladder    NO LONGER A PROBLEM   PONV (postoperative nausea and vomiting)  NAUSEA AFTER ONE SURGERY AS A CHILD   Seasonal allergies 07/23/2021   Social anxiety disorder 11/11/2011   Vesico-ureteral reflux    BLADDER REFLUX AS A CHILD - NO PROBLEM NOW   Past Surgical History:  Procedure Laterality Date   broken arm  2002   left, repair of ulna and radius   CHOLECYSTECTOMY N/A 08/09/2014   Procedure: LAPAROSCOPIC CHOLECYSTECTOMY WITH INTRAOPERATIVE CHOLANGIOGRAM;  Surgeon: Fran Imus, MD;  Location: [facility];  Service: General;  Laterality: N/A;   Tympanostomy with myringotomy Bilateral 1996   WISDOM TOOTH EXTRACTION  10/23/2012   Family History  Problem Relation Age of Onset   Stroke Father    Heart disease Father    Depression Father    Diabetes Father        type 2   Other Father        muscular spasm of the heart   Hyperlipidemia Father    Heart attack Father    Hyperlipidemia Maternal Grandmother    Hypertension Maternal Grandmother    Diabetes Maternal Grandmother        type 2   Skin cancer Maternal Grandmother    Cancer Maternal Grandfather        laryngeal, alcohol  and tobacco   Other Paternal Grandmother        arrythmia   Diabetes Paternal Grandmother        type 2   Pancreatic cancer Paternal Grandmother 48   Diabetes Paternal Grandfather        type 2   Hyperlipidemia Paternal Grandfather    Other Paternal Grandfather        fluid around the heart   Rheum arthritis Paternal Grandfather    Alzheimer's disease Paternal Grandfather    Allergies as of 05/12/2024       Reactions   Cefprozil Other (See Comments)   RASH HEAD TO TOE   Gardasil 9 [human Papillomavirus 9-valent Recombinant Vaccine]    Reaction to 2nd injection. Hives. Throat symptoms.    Macrobid  [nitrofurantoin ] Nausea And Vomiting   Nickel    Rash with contact   Sulfa Antibiotics    NAUSEA, GI UPSET        Medication List        Accurate as of May 12, 2024  2:06 PM. If you have any questions, ask your nurse or doctor.          STOP taking these medications    polyethylene glycol 17 g packet Commonly known as: MiraLax  Stopped by: Napolean Backbone       TAKE these medications    famotidine  20 MG tablet Commonly known as: PEPCID  Take 20 mg by mouth 2 (two) times daily.        All past medical history, surgical history, allergies, family history, immunizations andmedications were updated in the EMR today and reviewed under the history and medication portions of their EMR.     ROS Negative, with the exception of above mentioned in HPI   Objective:  BP 110/74   Pulse 72   Temp 98.3 F (36.8 C)   Wt (!) 311 lb (141.1 kg)   LMP  (LMP Unknown)   SpO2 99%   BMI 56.88 kg/m  Body mass index is 56.88 kg/m. Physical Exam Vitals and nursing note reviewed.  Constitutional:      General: She is not in acute distress.    Appearance: Normal appearance. She is obese. She is not ill-appearing or toxic-appearing.  HENT:  Head: Normocephalic and atraumatic.  Eyes:     General: No scleral icterus.       Right eye: No discharge.        Left eye: No  discharge.     Extraocular Movements: Extraocular movements intact.     Conjunctiva/sclera: Conjunctivae normal.     Pupils: Pupils are equal, round, and reactive to light.  Neck:     Comments: No thyromegaly Cardiovascular:     Rate and Rhythm: Normal rate and regular rhythm.  Musculoskeletal:     Cervical back: Neck supple.  Skin:    Findings: No rash.  Neurological:     Mental Status: She is alert and oriented to person, place, and time. Mental status is at baseline.     Motor: No weakness.     Coordination: Coordination normal.     Gait: Gait normal.  Psychiatric:        Mood and Affect: Mood normal.        Behavior: Behavior normal.        Thought Content: Thought content normal.        Judgment: Judgment normal.      No results found. No results found. No results found for this or any previous visit (from the past 24 hours).  Assessment/Plan: Robin Suarez is a 28 y.o. female present for OV for  Morbid obesity (HCC) (Primary)/weight gain We discussed weight gain and obesity briefly today.  We elected to move forward with labs surrounding obesity. Patient Suarez start a food diary for 2 weeks, after she returns from vacation next week.  She Suarez follow-up in 3-4 weeks to discuss labs in more detail and discuss diet, exercise and medications to assist with weight loss in more detail. She was also asked to contact her insurance to inquire if her insurance plan covers weight loss medications - CBC w/Diff - TSH - Hemoglobin A1c - Comp Met (CMET) - Lipid panel  Reviewed expectations re: course of current medical issues. Discussed self-management of symptoms. Outlined signs and symptoms indicating need for more acute intervention. Patient verbalized understanding and all questions were answered. Patient received an After-Visit Summary.    No orders of the defined types were placed in this encounter.  No orders of the defined types were placed in this  encounter.  Referral Orders  No referral(s) requested today     Note is dictated utilizing voice recognition software. Although note has been proof read prior to signing, occasional typographical errors still can be missed. If any questions arise, please do not hesitate to call for verification.   electronically signed by:  Napolean Backbone, DO  [facility] - OR

## 2024-05-13 ENCOUNTER — Ambulatory Visit: Payer: Self-pay | Admitting: Family Medicine

## 2024-05-13 LAB — TSH: TSH: 2.7 m[IU]/L

## 2024-05-13 LAB — CBC WITH DIFFERENTIAL/PLATELET
Absolute Lymphocytes: 2068 {cells}/uL (ref 850–3900)
Absolute Monocytes: 659 {cells}/uL (ref 200–950)
Basophils Absolute: 32 {cells}/uL (ref 0–200)
Basophils Relative: 0.6 %
Eosinophils Absolute: 108 {cells}/uL (ref 15–500)
Eosinophils Relative: 2 %
HCT: 41.6 % (ref 35.0–45.0)
Hemoglobin: 13.2 g/dL (ref 11.7–15.5)
MCH: 29.3 pg (ref 27.0–33.0)
MCHC: 31.7 g/dL — ABNORMAL LOW (ref 32.0–36.0)
MCV: 92.4 fL (ref 80.0–100.0)
MPV: 10 fL (ref 7.5–12.5)
Monocytes Relative: 12.2 %
Neutro Abs: 2533 {cells}/uL (ref 1500–7800)
Neutrophils Relative %: 46.9 %
Platelets: 366 10*3/uL (ref 140–400)
RBC: 4.5 10*6/uL (ref 3.80–5.10)
RDW: 12.4 % (ref 11.0–15.0)
Total Lymphocyte: 38.3 %
WBC: 5.4 10*3/uL (ref 3.8–10.8)

## 2024-05-13 LAB — LIPID PANEL
Cholesterol: 184 mg/dL (ref <200)
HDL: 54 mg/dL (ref >=50)
LDL Cholesterol (Calc): 101 mg/dL — ABNORMAL HIGH
Non-HDL Cholesterol (Calc): 130 mg/dL — ABNORMAL HIGH (ref <130)
Total CHOL/HDL Ratio: 3.4 (calc) (ref <5.0)
Triglycerides: 172 mg/dL — ABNORMAL HIGH (ref <150)

## 2024-05-13 LAB — COMPREHENSIVE METABOLIC PANEL WITH GFR
AG Ratio: 1.9 (calc) (ref 1.0–2.5)
ALT: 30 U/L — ABNORMAL HIGH (ref 6–29)
AST: 22 U/L (ref 10–30)
Albumin: 4.3 g/dL (ref 3.6–5.1)
Alkaline phosphatase (APISO): 88 U/L (ref 31–125)
BUN: 12 mg/dL (ref 7–25)
CO2: 24 mmol/L (ref 20–32)
Calcium: 9.6 mg/dL (ref 8.6–10.2)
Chloride: 106 mmol/L (ref 98–110)
Creat: 0.9 mg/dL (ref 0.50–0.96)
Globulin: 2.3 g/dL (ref 1.9–3.7)
Glucose, Bld: 76 mg/dL (ref 65–99)
Potassium: 4.9 mmol/L (ref 3.5–5.3)
Sodium: 139 mmol/L (ref 135–146)
Total Bilirubin: 0.8 mg/dL (ref 0.2–1.2)
Total Protein: 6.6 g/dL (ref 6.1–8.1)
eGFR: 89 mL/min/{1.73_m2} (ref >=60)

## 2024-05-13 LAB — HEMOGLOBIN A1C
Hgb A1c MFr Bld: 5.2 % (ref <5.7)
Mean Plasma Glucose: 103 mg/dL
eAG (mmol/L): 5.7 mmol/L

## 2024-08-17 ENCOUNTER — Encounter: Payer: Self-pay | Admitting: Family Medicine

## 2024-09-06 DIAGNOSIS — F330 Major depressive disorder, recurrent, mild: Secondary | ICD-10-CM | POA: Diagnosis not present

## 2024-09-06 DIAGNOSIS — F411 Generalized anxiety disorder: Secondary | ICD-10-CM | POA: Diagnosis not present

## 2024-09-23 ENCOUNTER — Encounter: Payer: Self-pay | Admitting: Family Medicine

## 2024-09-23 ENCOUNTER — Ambulatory Visit (HOSPITAL_BASED_OUTPATIENT_CLINIC_OR_DEPARTMENT_OTHER)
Admission: RE | Admit: 2024-09-23 | Discharge: 2024-09-23 | Disposition: A | Discharge status: Home / Self Care | Source: Ambulatory Visit | Attending: Family Medicine | Admitting: Family Medicine

## 2024-09-23 ENCOUNTER — Ambulatory Visit (INDEPENDENT_AMBULATORY_CARE_PROVIDER_SITE_OTHER): Admitting: Family Medicine

## 2024-09-23 ENCOUNTER — Ambulatory Visit: Payer: Self-pay | Admitting: Family Medicine

## 2024-09-23 VITALS — BP 117/77 | HR 81 | Temp 98.2°F | Wt 292.8 lb

## 2024-09-23 DIAGNOSIS — R072 Precordial pain: Secondary | ICD-10-CM | POA: Insufficient documentation

## 2024-09-23 DIAGNOSIS — K644 Residual hemorrhoidal skin tags: Secondary | ICD-10-CM

## 2024-09-23 DIAGNOSIS — F411 Generalized anxiety disorder: Secondary | ICD-10-CM | POA: Diagnosis not present

## 2024-09-23 DIAGNOSIS — R7989 Other specified abnormal findings of blood chemistry: Secondary | ICD-10-CM

## 2024-09-23 DIAGNOSIS — K625 Hemorrhage of anus and rectum: Secondary | ICD-10-CM | POA: Diagnosis not present

## 2024-09-23 DIAGNOSIS — R079 Chest pain, unspecified: Secondary | ICD-10-CM | POA: Diagnosis not present

## 2024-09-23 DIAGNOSIS — F330 Major depressive disorder, recurrent, mild: Secondary | ICD-10-CM | POA: Diagnosis not present

## 2024-09-23 LAB — D-DIMER, QUANTITATIVE: D-Dimer, Quant: 0.55 ug{FEU}/mL — ABNORMAL HIGH (ref <0.50)

## 2024-09-23 NOTE — Progress Notes (Signed)
 OFFICE VISIT  09/23/2024  CC:  Chief Complaint  Patient presents with   chest discomfort    Started yesterday on and off; cold left extremity, sharp abdominal pain; denies any SOB, HA, dizziness    Patient is a 28 y.o. female who presents for chest pain.  HPI: Onset yesterday, no trigger.  Left upper chest, sharp.  It comes and goes.  Today she felt a cold sensation in her left arm.  At the present time she has no pain. No associated nausea, palpitations, sweating, shortness of breath, or dizziness. No swelling or pain in her legs.  She had hypertension in pregnancy, delivered about 6 months ago.  Has not had any high blood pressures since that time.  Today she had some bright red blood on the toilet paper after a bowel movement. No rectal pain.  Past Medical History:  Diagnosis Date   Anxiety and depression 11/11/2011   Chicken pox as a child   Contact dermatitis 06/04/2012   TO NICKLE   Depression with anxiety 11/11/2011   Bipolar disorder screen is negative but borderline.     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   Migraine 04/16/2016   Nickel allergy    Obesity 11/11/2011   PT HAS LOST AT LEAST 100 LBS OVER PAST 9 TO 10 MONTHS-NO LONGER OBESE   Overactive bladder    NO LONGER A PROBLEM   PONV (postoperative nausea and vomiting)    NAUSEA AFTER ONE SURGERY AS A CHILD   Seasonal allergies 07/23/2021   Social anxiety disorder 11/11/2011   Vesico-ureteral reflux    BLADDER REFLUX AS A CHILD - NO PROBLEM NOW    Past Surgical History:  Procedure Laterality Date   broken arm  2002   left, repair of ulna and radius   CHOLECYSTECTOMY N/A 08/09/2014   Procedure: LAPAROSCOPIC CHOLECYSTECTOMY WITH INTRAOPERATIVE CHOLANGIOGRAM;  Surgeon: Camellia CHRISTELLA Blush, MD;  Location: [facility];  Service: General;  Laterality: N/A;   Tympanostomy with myringotomy Bilateral 1996   WISDOM TOOTH EXTRACTION  10/23/2012     Outpatient Medications Prior to Visit  Medication Sig Dispense Refill   famotidine  (PEPCID ) 20 MG tablet Take 20 mg by mouth 2 (two) times daily.     No facility-administered medications prior to visit.    Allergies  Allergen Reactions   Cefprozil Other (See Comments)    RASH HEAD TO TOE   Gardasil 9 [Human Papillomavirus 9-Valent Recombinant Vaccine]     Reaction to 2nd injection. Hives. Throat symptoms.    Macrobid  [Nitrofurantoin ] Nausea And Vomiting   Nickel     Rash with contact   Sulfa Antibiotics     NAUSEA, GI UPSET    Review of Systems  As per HPI  PE:    09/23/2024    2:40 PM 05/12/2024    2:00 PM 03/07/2024    2:18 PM  Vitals with BMI  Weight 292 lbs 13 oz 311 lbs   Systolic 144 110 889  Diastolic 79 74 64  Pulse 81 72 81   Body mass index is 53.55 kg/m.  Physical Exam  Exam chaperoned by Shanda Pizza, CMA  Gen: Alert, well appearing.  Patient is oriented to person, place, time, and situation. AFFECT: pleasant, lucid thought and speech. CV: RRR, no m/r/g.   LUNGS: CTA bilat, nonlabored resps, good aeration in all lung fields. ABD: soft, NT/ND EXT: no clubbing or cyanosis.  no edema.  No chest wall tenderness to palpation. Anal exam: Small hemorrhoid at 9 oclock, no blood or abrasion.  No fissure.  LABS:  Last CBC Lab Results  Component Value Date   WBC 5.4 05/12/2024   HGB 13.2 05/12/2024   HCT 41.6 05/12/2024   MCV 92.4 05/12/2024   MCH 29.3 05/12/2024   RDW 12.4 05/12/2024   PLT 366 05/12/2024   Last metabolic panel Lab Results  Component Value Date   GLUCOSE 76 05/12/2024   NA 139 05/12/2024   K 4.9 05/12/2024   CL 106 05/12/2024   CO2 24 05/12/2024   BUN 12 05/12/2024   CREATININE 0.90 05/12/2024   EGFR 89 05/12/2024   CALCIUM 9.6 05/12/2024   PHOS 3.1 06/21/2014   PROT 6.6 05/12/2024   ALBUMIN 2.0 (L) 03/07/2024   BILITOT 0.8 05/12/2024   ALKPHOS 108 03/07/2024   AST 22 05/12/2024   ALT 30 (H) 05/12/2024   ANIONGAP  6 03/07/2024   Last thyroid functions Lab Results  Component Value Date   TSH 2.70 05/12/2024   IMPRESSION AND PLAN:  #1 chest pain. Low suspicion of cardiopulmonary etiology. Discussed possible musculoskeletal versus esophageal. Check D-dimer and chest x-ray today.  2.  BRBPR.  Small hemorrhoid noted on exam. Reassured.  An After Visit Summary was printed and given to the patient.  FOLLOW UP: No follow-ups on file.  Signed:  Gerlene Hockey, MD           09/23/2024

## 2024-09-24 ENCOUNTER — Ambulatory Visit: Admitting: Family Medicine

## 2024-09-24 ENCOUNTER — Emergency Department (HOSPITAL_BASED_OUTPATIENT_CLINIC_OR_DEPARTMENT_OTHER)
Admission: EM | Admit: 2024-09-24 | Discharge: 2024-09-24 | Disposition: A | Discharge status: Home / Self Care | Source: Ambulatory Visit | Attending: Emergency Medicine | Admitting: Emergency Medicine

## 2024-09-24 ENCOUNTER — Emergency Department (HOSPITAL_BASED_OUTPATIENT_CLINIC_OR_DEPARTMENT_OTHER)

## 2024-09-24 ENCOUNTER — Encounter (HOSPITAL_BASED_OUTPATIENT_CLINIC_OR_DEPARTMENT_OTHER): Payer: Self-pay | Admitting: Emergency Medicine

## 2024-09-24 DIAGNOSIS — I493 Ventricular premature depolarization: Secondary | ICD-10-CM | POA: Diagnosis not present

## 2024-09-24 DIAGNOSIS — I2510 Atherosclerotic heart disease of native coronary artery without angina pectoris: Secondary | ICD-10-CM | POA: Diagnosis not present

## 2024-09-24 DIAGNOSIS — R7989 Other specified abnormal findings of blood chemistry: Secondary | ICD-10-CM | POA: Diagnosis not present

## 2024-09-24 DIAGNOSIS — R0789 Other chest pain: Secondary | ICD-10-CM

## 2024-09-24 DIAGNOSIS — R079 Chest pain, unspecified: Secondary | ICD-10-CM | POA: Diagnosis not present

## 2024-09-24 LAB — BASIC METABOLIC PANEL WITH GFR
Anion gap: 15 (ref 5–15)
BUN: 10 mg/dL (ref 6–20)
CO2: 19 mmol/L — ABNORMAL LOW (ref 22–32)
Calcium: 9.9 mg/dL (ref 8.9–10.3)
Chloride: 104 mmol/L (ref 98–111)
Creatinine, Ser: 0.95 mg/dL (ref 0.44–1.00)
GFR, Estimated: >60 mL/min (ref >60)
Glucose, Bld: 85 mg/dL (ref 70–99)
Potassium: 4.1 mmol/L (ref 3.5–5.1)
Sodium: 138 mmol/L (ref 135–145)

## 2024-09-24 LAB — CBC
HCT: 43 % (ref 36.0–46.0)
Hemoglobin: 14.7 g/dL (ref 12.0–15.0)
MCH: 29.6 pg (ref 26.0–34.0)
MCHC: 34.2 g/dL (ref 30.0–36.0)
MCV: 86.7 fL (ref 80.0–100.0)
Platelets: 312 K/uL (ref 150–400)
RBC: 4.96 MIL/uL (ref 3.87–5.11)
RDW: 12.9 % (ref 11.5–15.5)
WBC: 7 K/uL (ref 4.0–10.5)
nRBC: 0 % (ref 0.0–0.2)

## 2024-09-24 LAB — TROPONIN T, HIGH SENSITIVITY: Troponin T High Sensitivity: <15 ng/L (ref 0–19)

## 2024-09-24 LAB — PRO BRAIN NATRIURETIC PEPTIDE: Pro Brain Natriuretic Peptide: 60.6 pg/mL (ref <300.0)

## 2024-09-24 LAB — HCG, SERUM, QUALITATIVE: Preg, Serum: NEGATIVE

## 2024-09-24 MED ORDER — IOHEXOL 350 MG/ML SOLN
75.0000 mL | Freq: Once | INTRAVENOUS | Status: AC | PRN
  Administered 2024-09-24: 90 mL via INTRAVENOUS

## 2024-09-24 NOTE — Discharge Instructions (Addendum)
 You were seen in the ER today for concerns of chest pain.  Your labs and imaging were thankfully reassuring without any obvious findings to explain your current pain.  There is no sign of a heart attack, pneumonia, heart failure, blood clot, or rib fracture.  I would recommend taking a high-dose anti-inflammatory such as ibuprofen  to help with your pain.  If you have any feelings of acid reflux indigestion, a combination of omeprazole and famotidine  may be helpful for you.  Please follow-up with your primary care provider for further evaluation.  Return to the emergency department for any concerns of new or worsening symptoms.  Regarding your feelings of abnormal heartbeats, I believe you likely are experiencing something called premature ventricular contractions.  These are benign but they may be triggered due to your usage of phentermine .  If you continue using the phentermine  for weight loss, try to reduce other stimulant medication or substance use such as caffeine use.  Return to the emergency department for any concerns of worsening chest pain or shortness of breath.

## 2024-09-24 NOTE — ED Triage Notes (Signed)
 Pt Robin Suarez, ambulatory stating her PCP ordered stat CT PE study but because of her insurance she has to come to the ED.  Pt c/o CP mid chest radiating to L chest and back that started yesterday. Pt denies SOB.

## 2024-09-24 NOTE — ED Provider Triage Note (Signed)
 Emergency Medicine Provider Triage Evaluation Note  Robin Suarez , a 28 y.o. female post partum was evaluated in triage.  Pt complains of left sided chest pain. Had positive outpatient dimer. Sent in for PE study.  Review of Systems  Positive:  Negative:   Physical Exam  BP (!) 153/87 (BP Location: Right Arm)   Pulse (!) 103   Temp 98.1 F (36.7 C)   Resp 17   LMP 09/04/2024   SpO2 100%  Gen:   Awake, no distress   Resp:  Normal effort  MSK:   Moves extremities without difficulty  Other:    Medical Decision Making  Medically screening exam initiated at 6:13 PM.  Appropriate orders placed.  Darsha E Teare was informed that the remainder of the evaluation will be completed by another provider, this initial triage assessment does not replace that evaluation, and the importance of remaining in the ED until their evaluation is complete.  Will obtain routine labs and PE study.    Donnajean Lynwood DEL, PA-C 09/24/24 1817

## 2024-09-24 NOTE — Telephone Encounter (Signed)
 Please advise patient (and STAT team), that I placed a CTA for her at [facility].  OV 10/2 with Dr.Mcgowen. +d-dimer and precordial pain.  If pain worsens, or she develops dizziness or shortness of breath > she needs to go to ED.   If CTA is positive for a PE, then she will need to go to ED for treatment immediately (no exception on this- very important)

## 2024-09-24 NOTE — Telephone Encounter (Signed)
 Called and spoke with pt. After talking with Sherri C. Regarding the STAT, pt cannot go to any [facility] facility besides [facility] due to insurance. Pt cannot get scheduled for imaging for [facility] until Monday, and according to the pt's provider, Monday is too late for imaging. Pt advised ED is the only other option. Pt stated she did not want to go to the ED due to the risk of exposing her 40 month old with an illness. I told pt that I understood, but advised her it would be best to go in order for her provider to best access her issues.  Pt understood.

## 2024-09-28 ENCOUNTER — Encounter: Payer: Self-pay | Admitting: Family Medicine

## 2024-09-28 ENCOUNTER — Ambulatory Visit (INDEPENDENT_AMBULATORY_CARE_PROVIDER_SITE_OTHER): Admitting: Family Medicine

## 2024-09-28 ENCOUNTER — Ambulatory Visit (HOSPITAL_BASED_OUTPATIENT_CLINIC_OR_DEPARTMENT_OTHER)

## 2024-09-28 VITALS — BP 116/80 | HR 88 | Temp 98.4°F | Wt 287.0 lb

## 2024-09-28 DIAGNOSIS — M940 Chondrocostal junction syndrome [Tietze]: Secondary | ICD-10-CM | POA: Diagnosis not present

## 2024-09-28 DIAGNOSIS — F419 Anxiety disorder, unspecified: Secondary | ICD-10-CM

## 2024-09-28 MED ORDER — BUSPIRONE HCL 7.5 MG PO TABS: 7.5000 mg | ORAL_TABLET | Freq: Two times a day (BID) | ORAL | 5 refills | Status: AC

## 2024-09-28 NOTE — Progress Notes (Signed)
 Robin Suarez , 02/02/1996, 28 y.o., female MRN: [id number] Patient Care Team    Relationship Specialty Notifications Start End  Catherine Charlies LABOR, DO PCP - General Family Medicine  05/12/24   Pietro Redell RAMAN, MD PCP - Cardiology Cardiology  02/13/22   Obgyn, Anna    01/30/22     Chief Complaint  Patient presents with   Chest Pain    ED F/U. Still c/o chest pain, increased anxiety.      Subjective: Robin Suarez is a 28 y.o. Pt presents for ED follow-up. Patient was first seen by another provider 09/24/2023 for precordial pain.  D-dimer was mildly elevated and she was encouraged to go to the emergency room. I personally attempted to add a CTA order for her, but there was no availability over the weekend therefore she ended up to go to the emergency room after all. She was seen in the ED 09/24/2024 for chest pain and palpitations. Troponin was normal.  BMP was normal.  BNP, CBC were also normal.   EKG: Mild tachycardic-heart rate 101.  No STEMI  Patient reports today she is feeling better since the ED visit.  At least she was until this morning when she got in her MyChart and read the result of her EKG which made her feel concerned because it said she was tachycardic. She has noticed that she is having more symptoms when she feels anxious.  She also had been on phentermine  prescribed by her gynecologist until about 3 days ago, which she discontinued.  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  CTA 09/24/2024: FINDINGS: PULMONARY ARTERIES: Pulmonary arteries are adequately opacified for evaluation. No acute pulmonary embolus. Main pulmonary artery is normal in caliber. MEDIASTINUM: The heart and pericardium demonstrate no acute abnormality. There is no acute abnormality of the thoracic aorta. LYMPH NODES: No mediastinal, hilar or axillary lymphadenopathy. LUNGS AND  PLEURA: The lungs are without acute process. No focal consolidation or pulmonary edema. No evidence of pleural effusion or pneumothorax. UPPER ABDOMEN: Multiple surgical clips are seen within the gallbladder fossa. SOFT TISSUES AND BONES: No acute bone or soft tissue abnormality.   IMPRESSION: 1. No pulmonary embolism.     10/10/2023    8:41 AM 06/11/2023    1:11 PM 03/14/2023   10:43 AM 01/15/2022    1:14 PM 07/20/2021    3:11 PM  Depression screen PHQ 2/9  Decreased Interest 1 2 1  0 0  Down, Depressed, Hopeless 1 2 1  0 0  PHQ - 2 Score 2 4 2  0 0  Altered sleeping 0 1 1 1  0  Tired, decreased energy 1 3 1 1 1   Change in appetite 0 1 0 0 0  Feeling bad or failure about yourself  1 2 1  0 0  Trouble concentrating 0 0 0 0 0  Moving slowly or fidgety/restless 0 0 0 0 0  Suicidal thoughts 0 0 0 0 0  PHQ-9 Score 4 11 5 2 1   Difficult doing work/chores Not difficult at all Somewhat difficult       Allergies  Allergen Reactions   Cefprozil Other (See Comments)    RASH HEAD TO TOE   Gardasil 9 [Human Papillomavirus 9-Valent Recombinant Vaccine]     Reaction to 2nd injection. Hives. Throat symptoms.    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     Gallstones    GERD (gastroesophageal reflux disease)    Gestational hypertension 03/05/2024    Hayfever    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   Migraine 04/16/2016   Nickel allergy    Normal labor 03/06/2024   Obesity 11/11/2011   PT HAS LOST AT LEAST 100 LBS OVER PAST 9 TO 10 MONTHS-NO LONGER OBESE   Overactive bladder    NO LONGER A PROBLEM   PONV (postoperative nausea and vomiting)    NAUSEA AFTER ONE SURGERY AS A CHILD   Seasonal allergies 07/23/2021   Social anxiety disorder 11/11/2011   Vesico-ureteral reflux    BLADDER REFLUX AS A CHILD - NO PROBLEM NOW   Past Surgical History:  Procedure Laterality Date   broken arm  2002   left, repair of ulna and radius   CHOLECYSTECTOMY N/A 08/09/2014   Procedure: LAPAROSCOPIC CHOLECYSTECTOMY WITH INTRAOPERATIVE CHOLANGIOGRAM;  Surgeon: Camellia CHRISTELLA Blush, MD;  Location: [facility];  Service: General;  Laterality: N/A;   Tympanostomy with myringotomy Bilateral 1996   WISDOM TOOTH EXTRACTION  10/23/2012   Family History  Problem Relation Age of Onset   Stroke Father    Heart disease Father    Depression Father    Diabetes Father        type 2   Other Father        muscular spasm of the heart   Hyperlipidemia Father    Heart attack Father    Hyperlipidemia Maternal Grandmother    Hypertension Maternal Grandmother    Diabetes Maternal Grandmother        type 2   Skin cancer Maternal Grandmother    Cancer Maternal Grandfather        laryngeal, alcohol and tobacco   Other Paternal Grandmother        arrythmia   Diabetes Paternal Grandmother        type 2   Pancreatic cancer Paternal Grandmother 71   Diabetes Paternal Grandfather        type 2   Hyperlipidemia Paternal Grandfather    Other Paternal Grandfather        fluid around the heart   Rheum arthritis Paternal Grandfather    Alzheimer's disease Paternal Grandfather    Allergies as of 09/28/2024       Reactions   Cefprozil Other (See Comments)   RASH HEAD TO TOE   Gardasil 9 [human Papillomavirus 9-valent Recombinant Vaccine]    Reaction to 2nd injection. Hives.  Throat symptoms.    Macrobid  [nitrofurantoin ] Nausea And Vomiting   Nickel    Rash with contact   Sulfa Antibiotics    NAUSEA, GI UPSET        Medication List  Accurate as of September 28, 2024  3:08 PM. If you have any questions, ask your nurse or doctor.          busPIRone 7.5 MG tablet Commonly known as: BUSPAR Take 1 tablet (7.5 mg total) by mouth 2 (two) times daily. Started by: Charlies Bellini   famotidine  20 MG tablet Commonly known as: PEPCID  Take 20 mg by mouth 2 (two) times daily.   pantoprazole  40 MG tablet Commonly known as: PROTONIX  Take 40 mg by mouth daily.   ZyrTEC  Allergy 10 MG tablet Generic drug: cetirizine         All past medical history, surgical history, allergies, family history, immunizations andmedications were updated in the EMR today and reviewed under the history and medication portions of their EMR.     ROS Negative, with the exception of above mentioned in HPI   Objective:  BP 116/80   Pulse 88   Temp 98.4 F (36.9 C)   Wt 287 lb (130.2 kg)   LMP 09/04/2024   SpO2 97%   BMI 52.49 kg/m  Body mass index is 52.49 kg/m. Physical Exam Vitals and nursing note reviewed.  Constitutional:      General: She is not in acute distress.    Appearance: Normal appearance. She is obese. She is not ill-appearing, toxic-appearing or diaphoretic.  HENT:     Head: Normocephalic and atraumatic.  Eyes:     General: No scleral icterus.       Right eye: No discharge.        Left eye: No discharge.     Extraocular Movements: Extraocular movements intact.     Conjunctiva/sclera: Conjunctivae normal.     Pupils: Pupils are equal, round, and reactive to light.  Cardiovascular:     Rate and Rhythm: Normal rate and regular rhythm.     Heart sounds: No murmur heard.    Comments: Reproducible discomfort to palpation bilateral upper chest Pulmonary:     Effort: Pulmonary effort is normal. No respiratory distress.     Breath sounds: Normal  breath sounds. No wheezing, rhonchi or rales.  Musculoskeletal:     Cervical back: Neck supple.     Right lower leg: No edema.     Left lower leg: No edema.  Skin:    General: Skin is warm.     Findings: No rash.  Neurological:     Mental Status: She is alert and oriented to person, place, and time. Mental status is at baseline.     Motor: No weakness.     Gait: Gait normal.  Psychiatric:        Mood and Affect: Mood normal.        Behavior: Behavior normal.        Thought Content: Thought content normal.        Judgment: Judgment normal.     No results found. No results found. No results found for this or any previous visit (from the past 24 hours).  Assessment/Plan: Robin Suarez is a 28 y.o. female present for OV for  Anxiety (Primary) Suspect her chest discomfort is caused partially by her anxiety with PVCs. She has discontinued the phentermine  which should help reduce racing heart and PVCs. We discussed her anxiety in more detail today.  She has been intolerant to Effexor  and Celexa  in the past, which made her anxiety worse. We discussed trying BuSpar today which is in a different class and is prescribed just for anxiety and she is agreeable to this.  She understands she would need to be on this medication for 3 to 4 weeks to appreciate the effects of the medication. Follow-up in 4-6 weeks  Costochondritis Suspect her chest discomfort is caused partially by her anxiety with PVCs. She has discontinued the phentermine  which should help reduce racing heart and PVCs. Started BuSpar to address the anxiety. 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  Encouraged her to use Advil  or naproxen to help with the symptoms.   Reviewed expectations re: course of current medical issues. Discussed self-management of symptoms. Outlined signs and symptoms indicating need for  more acute intervention. Patient verbalized understanding and all questions were answered. Patient received an After-Visit Summary.    No orders of the defined types were placed in this encounter.  Meds ordered this encounter  Medications   busPIRone (BUSPAR) 7.5 MG tablet    Sig: Take 1 tablet (7.5 mg total) by mouth 2 (two) times daily.    Dispense:  60 tablet    Refill:  5   Referral Orders  No referral(s) requested today     Note is dictated utilizing voice recognition software. Although note has been proof read prior to signing, occasional typographical errors still can be missed. If any questions arise, please do not hesitate to call for verification.   electronically signed by:  Charlies Bellini, DO  [facility] - [place]

## 2024-09-28 NOTE — Patient Instructions (Addendum)
 Return in about 6 weeks (around 11/08/2024) for Routine chronic condition follow-up.        Great to see you today.  I have refilled the medication(s) we provide.   If labs were collected or images ordered, we will inform you of  results once we have received them and reviewed. We will contact you either by echart message, or telephone call.  Please give ample time to the testing facility, and our office to run,  receive and review results. Please do not call inquiring of results, even if you can see them in your chart. We will contact you as soon as we are able. If it has been over 1 week since the test was completed, and you have not yet heard from us , then please call us .    - echart message- for normal results that have been seen by the patient already.   - telephone call: abnormal results or if patient has not viewed results in their echart.  If a referral to a specialist was entered for you, please call us  in 2 weeks if you have not heard from the specialist office to schedule.

## 2024-09-30 DIAGNOSIS — F411 Generalized anxiety disorder: Secondary | ICD-10-CM | POA: Diagnosis not present

## 2024-09-30 DIAGNOSIS — F330 Major depressive disorder, recurrent, mild: Secondary | ICD-10-CM | POA: Diagnosis not present

## 2024-10-05 NOTE — ED Provider Notes (Signed)
 [name]ERGENC[facility] DEPARTMENT AT [facility] Provider Note   CSN: [id number] Arrival date & time: 09/24/24  1750     Patient presents with: No chief complaint on file.   Robin Suarez is a 28 y.o. female.  Patient with past history significant for gallstones, GERD, anxiety depression, gestational hypertension who presents to the emergency department today with concerns of chest pain.  Patient had evaluation by PCP with positive D-dimer and advised to come the emergency department for evaluation.  She is approximately 6 months postpartum.  She denies any appreciable leg swelling, hemoptysis, but does endorse some shortness of breath and chest pressure.  No prior history of PE.  Not on blood thinners.  She has been taking phentermine  for weight loss and is unsure if this is causing her symptoms.   HPI     Prior to Admission medications   Medication Sig Start Date End Date Taking? Authorizing Provider  pantoprazole  (PROTONIX ) 40 MG tablet Take 40 mg by mouth daily. 09/14/24  Yes [provider]  busPIRone (BUSPAR) 7.5 MG tablet Take 1 tablet (7.5 mg total) by mouth 2 (two) times daily. 09/28/24   Kuneff, Renee A, DO  cetirizine  (ZYRTEC  ALLERGY) 10 MG tablet     [provider]  famotidine  (PEPCID ) 20 MG tablet Take 20 mg by mouth 2 (two) times daily.    [provider]    Allergies: Cefprozil, Gardasil 9 [human papillomavirus 9-valent recombinant vaccine], Macrobid  [nitrofurantoin ], Nickel, and Sulfa antibiotics    Review of Systems  Respiratory:  Positive for shortness of breath.   Cardiovascular:  Positive for chest pain.  All other systems reviewed and are negative.   Updated Vital Signs BP 113/66 (BP Location: Right Arm)   Pulse 74   Temp 98.7 F (37.1 C) (Oral)   Resp 18   LMP 09/04/2024   SpO2 98%   Physical Exam Vitals and nursing note reviewed.  Constitutional:      General: She is not in acute distress.    Appearance: She is  well-developed.  HENT:     Head: Normocephalic and atraumatic.  Eyes:     Conjunctiva/sclera: Conjunctivae normal.  Cardiovascular:     Rate and Rhythm: Normal rate and regular rhythm.     Heart sounds: No murmur heard. Pulmonary:     Effort: Pulmonary effort is normal. No respiratory distress.     Breath sounds: Normal breath sounds. No wheezing or rales.  Abdominal:     Palpations: Abdomen is soft.     Tenderness: There is no abdominal tenderness.  Musculoskeletal:        General: No swelling.     Cervical back: Neck supple.  Skin:    General: Skin is warm and dry.     Capillary Refill: Capillary refill takes less than 2 seconds.  Neurological:     Mental Status: She is alert.  Psychiatric:        Mood and Affect: Mood normal.     (all labs ordered are listed, but only abnormal results are displayed) Labs Reviewed  BASIC METABOLIC PANEL WITH GFR - Abnormal; Notable for the following components:      Result Value   CO2 19 (*)    All other components within normal limits  CBC  HCG, SERUM, QUALITATIVE  PRO BRAIN NATRIURETIC PEPTIDE  TROPONIN T, HIGH SENSITIVITY    EKG: EKG Interpretation Date/Time:  Friday September 24 2024 17:59:21 EDT Ventricular Rate:  101 PR Interval:  118 QRS  Duration:  84 QT Interval:  324 QTC Calculation: 420 R Axis:   64  Text Interpretation: Sinus tachycardia Nonspecific T wave abnormality Abnormal ECG No previous ECGs available Confirmed by Dreama Longs ([id number]) on 09/26/2024 6:00:29 PM  Radiology: No results found.   Procedures   Medications Ordered in the ED  iohexol  (OMNIPAQUE ) 350 MG/ML injection 75 mL (90 mLs Intravenous Contrast Given 09/24/24 1923)                                    Medical Decision Making Amount and/or Complexity of Data Reviewed Labs: ordered.   This patient presents to the ED for concern of chest pain, this involves an extensive number of treatment options, and is a complaint that carries with it  a high risk of complications and morbidity.  The differential diagnosis includes ACS, PE, pneumonia, bronchitis, GERD   Co morbidities that complicate the patient evaluation  Gallstones, CAD, anxiety, depression, gestational hypertension   Lab Tests:  I Ordered, and personally interpreted labs.  The pertinent results include: CBC and BMP unremarkable, hCG negative, troponin negative at less than 15, BMP unremarkable   Imaging Studies ordered:  I ordered imaging studies including CT angio chest I independently visualized and interpreted imaging which showed no evidence of PE or other acute abnormality I agree with the radiologist interpretation   Cardiac Monitoring: / EKG:  The patient was maintained on a cardiac monitor.  I personally viewed and interpreted the cardiac monitored which showed an underlying rhythm of: Sinus tachycardia   Consultations Obtained:  I requested consultation with none,  and discussed lab and imaging findings as well as pertinent plan - they recommend: N/A   Problem List / ED Course / Critical interventions / Medication management  Patient with past history significant for gallstones, GERD, anxiety, depression, gestational hypertension presents to the emergency department concerns of chest pain shortness of breath.  Had D-dimer performed by PCP that was elevated advised to come to emergency department for evaluation.  No recent hemoptysis, history of PE or DVT.  Not currently blood thinners.  She is approximately 6 months postpartum. Physical exam unremarkable with no abnormal heart or lung sounds.  No appreciable lower extremity swelling or edema.  Otherwise well-appearing with stable vitals with exception of slight tachycardia. Workup performed from triage is unremarkable with negative troponin negative BNP.  CT angio chest negative for PE.  Other labs unremarkable. Informed patient of reassuring findings.  Unclear cause of patient's current symptoms but  she does appear to have some episodes of PVCs that are likely somewhat contributing to the symptoms. She has been taking phentermine  for weight loss and given it's stimulant nature, this may have been potentially contributing to the feelings of abnormal heart rhythm that she had been reporting. I advised patient that until she follows up with PCP, she should consider stopping phentermine  and reducing caffeine or other stimulating medications/supplements. No other acute or focal concerns at this time. Will discharge home with instructions for close outpatient follow up and strict return precautions discussed. I have reviewed the patients home medicines and have made adjustments as needed   Social Determinants of Health:  None   Test / Admission - Considered:  Considered but stable for outpatient follow-up.  Final diagnoses:  Other chest pain  PVC (premature ventricular contraction)    ED Discharge Orders     None  Ojas Coone A, PA-C 10/05/24 0714    Pamella Ozell LABOR, DO 10/06/24 [phone]

## 2024-10-25 DIAGNOSIS — F330 Major depressive disorder, recurrent, mild: Secondary | ICD-10-CM | POA: Diagnosis not present

## 2024-10-25 DIAGNOSIS — F411 Generalized anxiety disorder: Secondary | ICD-10-CM | POA: Diagnosis not present

## 2024-11-03 NOTE — Progress Notes (Unsigned)
 Cardiology Office Note:    Date:  11/05/2024   ID:  JOEANNA HOWDYSHELL, DOB Jul 14, 1996, MRN [id number]  PCP:  Catherine Charlies LABOR, DO   [facility] Providers Cardiologist:  Redell Shallow, MD     Referring MD: Catherine Charlies LABOR, DO   Chief Complaint  Patient presents with   Follow-up    Chest pain, palpitations    History of Present Illness:    Pollyanna E Moye is a 28 y.o. female with a hx of GERD, gallstones, anxiety, depression, gestational hypertension.  She was seen by Dr. Shallow in 2023 for evaluation of dizziness and palpitations.  Does not appear her heart monitor was completed.  Echocardiogram showed LVEF 55-60%, normal RV size and function, no significant valvular disease.  She was seen in the ER 09/24/24 for chest pain and shortness of breath in the setting of positive D-dimer with PCP.  She ruled out with negative enzymes and nonischemic EKG. she is approximately 6 months from delivery.  CTA was negative for PE.  Telemetry notable for PVCs.  She has been taking phentermine  for weight loss.  She presents today for cardiology follow up, here with her father. She describes chest pain that started randomly and intermittent throughout the day and continued for 2-3 days. No diaphoresis, N/V. 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 Chest pain had a pleuritic component, no change with lying flat. This was also in the setting of phentermine . She works cleaning homes and states she does not necessarily get chest pain with exertion.   She recounts some DOE but deconditioned since birth of her son 8 months ago (gained 100 lbs during her pregnancy). Hx of gestational hypertension after 20 weeks. She is trying to increase exercise.   She is also having palpitations, not prolonged. No syncope or presyncope. She mentions added stress at home.   She is a neversmoker. A1c 5.2%. LDL 101.  TSH 2.7. No further children  planned.    Past Medical History:  Diagnosis Date   Anxiety and depression 11/11/2011   Chicken pox as a child   Contact dermatitis 06/04/2012   TO NICKLE   Depression with anxiety 11/11/2011   Bipolar disorder screen is negative but borderline.     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   Migraine 04/16/2016   Nickel allergy    Normal labor 03/06/2024   Obesity 11/11/2011   PT HAS LOST AT LEAST 100 LBS OVER PAST 9 TO 10 MONTHS-NO LONGER OBESE   Overactive bladder    NO LONGER A PROBLEM   PONV (postoperative nausea and vomiting)    NAUSEA AFTER ONE SURGERY AS A CHILD   Seasonal allergies 07/23/2021   Social anxiety disorder 11/11/2011   Vesico-ureteral reflux    BLADDER REFLUX AS A CHILD - NO PROBLEM NOW    Past Surgical History:  Procedure Laterality Date   broken arm  2002   left, repair of ulna and radius   CHOLECYSTECTOMY N/A 08/09/2014   Procedure: LAPAROSCOPIC CHOLECYSTECTOMY WITH INTRAOPERATIVE CHOLANGIOGRAM;  Surgeon: Camellia CHRISTELLA Blush, MD;  Location: [facility];  Service: General;  Laterality: N/A;   Tympanostomy with myringotomy Bilateral 1996   WISDOM TOOTH EXTRACTION  10/23/2012    Current Medications: Current Meds  Medication Sig   busPIRone (BUSPAR) 7.5 MG tablet Take 1 tablet (7.5 mg total)  by mouth 2 (two) times daily.   cetirizine  (ZYRTEC  ALLERGY) 10 MG tablet  (Patient taking differently: Take 10 mg by mouth daily as needed.)   cyanocobalamin  (VITAMIN B12) 1000 MCG tablet Take 3,000 mcg by mouth daily.   famotidine  (PEPCID ) 20 MG tablet Take 20 mg by mouth 2 (two) times daily. (Patient taking differently: Take 20 mg by mouth 2 (two) times daily as needed.)   pantoprazole  (PROTONIX ) 40 MG tablet Take 40 mg by mouth daily. (Patient taking differently: Take 40 mg by mouth daily as needed.)     Allergies:   Cefprozil, Gardasil 9 [human papillomavirus 9-valent  recombinant vaccine], Macrobid  [nitrofurantoin ], Nickel, and Sulfa antibiotics   Social History   Socioeconomic History   Marital status: Married    Spouse name: Jake   Number of children: Not on file   Years of education: Not on file   Highest education level: Associate degree: occupational, scientist, product/process development, or vocational program  Occupational History   Not on file  Tobacco Use   Smoking status: Never    Passive exposure: Never   Smokeless tobacco: Never  Vaping Use   Vaping status: Never Used  Substance and Sexual Activity   Alcohol use: Yes    Comment: occ   Drug use: No   Sexual activity: Yes    Birth control/protection: Pill, I.U.D.    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:      -Wears a bicycle helmet riding a bike: Yes     -smoke alarm in the home:Yes     - wears seatbelt: Yes     - Feels safe in their relationships: Yes      Social Drivers of Corporate Investment Banker Strain: Low Risk  (09/22/2024)   Overall Financial Resource Strain (CARDIA)    Difficulty of Paying Living Expenses: Not hard at all  Food Insecurity: No Food Insecurity (09/22/2024)   Hunger Vital Sign    Worried About Running Out of Food in the Last Year: Never true    Ran Out of Food in the Last Year: Never true  Transportation Needs: No Transportation Needs (09/22/2024)   PRAPARE - Administrator, Civil Service (Medical): No    Lack of Transportation (Non-Medical): No  Physical Activity: Insufficiently Active (09/22/2024)   Exercise Vital Sign    Days of Exercise per Week: 2 days    Minutes of Exercise per Session: 30 min  Stress: No Stress Concern Present (09/22/2024)   [employer] of Occupational Health - Occupational Stress Questionnaire    Feeling of Stress: Not at all  Social Connections: Moderately  Integrated (09/22/2024)   Social Connection and Isolation Panel    Frequency of Communication with Friends and Family: More than three times a week    Frequency of Social Gatherings with Friends and Family: More than three times a week    Attends Religious Services: More than 4 times per year    Active Member of [employer] or Organizations: No    Attends Engineer, Structural: Not on file    Marital Status: Married     Family History: The patient's family history includes Alzheimer's disease in her paternal grandfather; Cancer in her maternal grandfather; Depression in her father; Diabetes in her father, maternal grandmother, paternal grandfather, and paternal grandmother; Heart attack in  her father; Heart disease in her father; Hyperlipidemia in her father, maternal grandmother, and paternal grandfather; Hypertension in her maternal grandmother; Other in her father, paternal grandfather, and paternal grandmother; Pancreatic cancer (age of onset: 79) in her paternal grandmother; Rheum arthritis in her paternal grandfather; Skin cancer in her maternal grandmother; Stroke in her father.  ROS:   Please see the history of present illness.     All other systems reviewed and are negative.  EKGs/Labs/Other Studies Reviewed:    The following studies were reviewed today:       Recent Labs: 05/12/2024: ALT 30; TSH 2.70 09/24/2024: BUN 10; Creatinine, Ser 0.95; Hemoglobin 14.7; Platelets 312; Potassium 4.1; Pro Brain Natriuretic Peptide 60.6; Sodium 138  Recent Lipid Panel    Component Value Date/Time   CHOL 184 05/12/2024 1417   TRIG 172 (H) 05/12/2024 1417   HDL 54 05/12/2024 1417   CHOLHDL 3.4 05/12/2024 1417   LDLCALC 101 (H) 05/12/2024 1417     Risk Assessment/Calculations:                Physical Exam:    VS:  BP 122/82 (BP Location: Left Arm, Patient Position: Sitting, Cuff Size: Large)   Pulse 75   Ht 5' 3 (1.6 m)   Wt 287 lb (130.2 kg)   SpO2 95%   BMI 50.84 kg/m      Wt Readings from Last 3 Encounters:  11/05/24 287 lb (130.2 kg)  09/28/24 287 lb (130.2 kg)  09/23/24 292 lb 12.8 oz (132.8 kg)     GEN:  Well nourished, well developed in no acute distress HEENT: Normal NECK: No JVD; No carotid bruits LYMPHATICS: No lymphadenopathy CARDIAC: RRR, no murmurs, rubs, gallops RESPIRATORY:  Clear to auscultation without rales, wheezing or rhonchi  ABDOMEN: Soft, non-tender, non-distended MUSCULOSKELETAL:  No edema; No deformity  SKIN: Warm and dry NEUROLOGIC:  Alert and oriented x 3 PSYCHIATRIC:  Normal affect   ASSESSMENT:    1. Chest pain of uncertain etiology   2. PVC (premature ventricular contraction)   3. Palpitations   4. Class 3 severe obesity with body mass index (BMI) of 50.0 to 59.9 in adult, unspecified obesity type, unspecified whether serious comorbidity present (HCC)    PLAN:    In order of problems listed above:  Chest pain - improving, not necessarily exertional, changes with movement and pleuritic  - will obtain sed rate and CRP today to rule out pericaditis - suspect this is related to MSK given atypical nature - on review of CTA, do not appreciate significant coronary calcification   Palpitations PVCs - she wears a smart watch and is able to show me a captured PVC that occurred with symptoms - reassured these are benign, but continue monitoring - recommended magnesium  taurate   Obesity - recommend avoiding phentermine  given PVCs - may consider GLP-1 instead, will defer to PCP   Follow up in 2 months.            Medication Adjustments/Labs and Tests Ordered: Current medicines are reviewed at length with the patient today.  Concerns regarding medicines are outlined above.  Orders Placed This Encounter  Procedures   C-reactive protein   Sedimentation rate   No orders of the defined types were placed in this encounter.   Patient Instructions  Medication Instructions:  Magnesium  Taurate OTC 200-400 mg  daily Voltaren Gel OTC use as directed per box  *If you need a refill on your cardiac medications before your next appointment, please  call your pharmacy*  Lab Work: CRP, Sedrate today  Testing/Procedures: NONE ordered at this time of appointment   Follow-Up: At [facility], you and your health needs are our priority.  As part of our continuing mission to provide you with exceptional heart care, our providers are all part of one team.  This team includes your primary Cardiologist (physician) and Advanced Practice Providers or APPs (Physician Assistants and Nurse Practitioners) who all work together to provide you with the care you need, when you need it.  Your next appointment:    Keep appointment   Provider:   Jon Hails, PA-C          We recommend signing up for the patient portal called MyChart.  Sign up information is provided on this After Visit Summary.  MyChart is used to connect with patients for Virtual Visits (Telemedicine).  Patients are able to view lab/test results, encounter notes, upcoming appointments, etc.  Non-urgent messages can be sent to your provider as well.   To learn more about what you can do with MyChart, go to [email or web address].               Signed, Jon Nat Hails, PA  11/05/2024 9:18 AM    [facility]

## 2024-11-05 ENCOUNTER — Ambulatory Visit: Attending: Physician Assistant | Admitting: Physician Assistant

## 2024-11-05 ENCOUNTER — Encounter: Payer: Self-pay | Admitting: Physician Assistant

## 2024-11-05 VITALS — BP 122/82 | HR 75 | Ht 63.0 in | Wt 287.0 lb

## 2024-11-05 DIAGNOSIS — R002 Palpitations: Secondary | ICD-10-CM

## 2024-11-05 DIAGNOSIS — R079 Chest pain, unspecified: Secondary | ICD-10-CM

## 2024-11-05 DIAGNOSIS — Z6841 Body Mass Index (BMI) 40.0 and over, adult: Secondary | ICD-10-CM

## 2024-11-05 DIAGNOSIS — E66813 Obesity, class 3: Secondary | ICD-10-CM | POA: Diagnosis not present

## 2024-11-05 DIAGNOSIS — I493 Ventricular premature depolarization: Secondary | ICD-10-CM

## 2024-11-05 NOTE — Patient Instructions (Signed)
 Medication Instructions:  Magnesium  Taurate OTC 200-400 mg daily Voltaren Gel OTC use as directed per box  *If you need a refill on your cardiac medications before your next appointment, please call your pharmacy*  Lab Work: CRP, Sedrate today  Testing/Procedures: NONE ordered at this time of appointment   Follow-Up: At [facility], you and your health needs are our priority.  As part of our continuing mission to provide you with exceptional heart care, our providers are all part of one team.  This team includes your primary Cardiologist (physician) and Advanced Practice Providers or APPs (Physician Assistants and Nurse Practitioners) who all work together to provide you with the care you need, when you need it.  Your next appointment:    Keep appointment   Provider:   Jon Hails, PA-C          We recommend signing up for the patient portal called MyChart.  Sign up information is provided on this After Visit Summary.  MyChart is used to connect with patients for Virtual Visits (Telemedicine).  Patients are able to view lab/test results, encounter notes, upcoming appointments, etc.  Non-urgent messages can be sent to your provider as well.   To learn more about what you can do with MyChart, go to [email or web address].

## 2024-11-06 LAB — C-REACTIVE PROTEIN: CRP: 5 mg/L (ref 0–10)

## 2024-11-06 LAB — SEDIMENTATION RATE: Sed Rate: 2 mm/h (ref 0–32)

## 2024-11-08 DIAGNOSIS — F330 Major depressive disorder, recurrent, mild: Secondary | ICD-10-CM | POA: Diagnosis not present

## 2024-11-08 DIAGNOSIS — F411 Generalized anxiety disorder: Secondary | ICD-10-CM | POA: Diagnosis not present

## 2024-11-09 ENCOUNTER — Ambulatory Visit: Payer: Self-pay | Admitting: Physician Assistant

## 2024-11-09 ENCOUNTER — Ambulatory Visit (INDEPENDENT_AMBULATORY_CARE_PROVIDER_SITE_OTHER): Admitting: Family Medicine

## 2024-11-09 ENCOUNTER — Encounter: Payer: Self-pay | Admitting: Family Medicine

## 2024-11-09 VITALS — BP 128/78 | HR 71 | Wt 290.0 lb

## 2024-11-09 DIAGNOSIS — F419 Anxiety disorder, unspecified: Secondary | ICD-10-CM | POA: Diagnosis not present

## 2024-11-09 DIAGNOSIS — M940 Chondrocostal junction syndrome [Tietze]: Secondary | ICD-10-CM | POA: Insufficient documentation

## 2024-11-09 DIAGNOSIS — J3489 Other specified disorders of nose and nasal sinuses: Secondary | ICD-10-CM | POA: Insufficient documentation

## 2024-11-09 DIAGNOSIS — I493 Ventricular premature depolarization: Secondary | ICD-10-CM | POA: Insufficient documentation

## 2024-11-09 NOTE — Progress Notes (Signed)
 Robin Suarez , 05/01/1996, 28 y.o., female MRN: [id number] Patient Care Team    Relationship Specialty Notifications Start End  Catherine Charlies LABOR, DO PCP - General Family Medicine  05/12/24   Pietro Redell RAMAN, MD PCP - Cardiology Cardiology  02/13/22   Obgyn, Anna    01/30/22     Chief Complaint  Patient presents with   Anxiety     Subjective: Robin Suarez is a 28 y.o. Pt presents for chronic condition follow-up after starting medication and new acute concern. Medication reconciliation completed today Past medical history updated where appropriate.   Anxiety/palpitations: Patient has been avoiding stimulant use.  She has started BuSpar 7.5 mg twice daily. She reports today she feels it is working very well for her.  Palpitations have resolved. She was also seen by cardiology  Costochondritis: Patient was encouraged to perform stretch and use NSAIDs as needed.  Today she reports she has been performing her stretches and symptoms are improving.  Patient was first seen by another provider 09/24/2023 for precordial pain.  D-dimer was mildly elevated and she was encouraged to go to the emergency room. I personally attempted to add a CTA order for her, but there was no availability over the weekend therefore she ended up to go to the emergency room after all. She was seen in the ED 09/24/2024 for chest pain and palpitations. Troponin was normal.  BMP was normal.  BNP, CBC were also normal.   EKG: Mild tachycardic-heart rate 101.  No STEMI She had noticed that she is having more symptoms when she feels anxious.   She also had been on phentermine  prescribed by her gynecologist until about 3 days ago, which she has since discontinued.  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  Nasal lesion: Patient reports she has noticed a area inside her left nostril that is not healing  over the last 3 to 4 days.  She has had frontal sinus discomfort as well over this time.  No fevers, chills or signs of upper respiratory infection.  She has tried nasal saline which does seem to make the inside of her nose feel better, however the lesion is not healing.  CTA 09/24/2024: FINDINGS: PULMONARY ARTERIES: Pulmonary arteries are adequately opacified for evaluation. No acute pulmonary embolus. Main pulmonary artery is normal in caliber. MEDIASTINUM: The heart and pericardium demonstrate no acute abnormality. There is no acute abnormality of the thoracic aorta. LYMPH NODES: No mediastinal, hilar or axillary lymphadenopathy. LUNGS AND PLEURA: The lungs are without acute process. No focal consolidation or pulmonary edema. No evidence of pleural effusion or pneumothorax. UPPER ABDOMEN: Multiple surgical clips are seen within the gallbladder fossa. SOFT TISSUES AND BONES: No acute bone or soft tissue abnormality.   IMPRESSION: 1. No pulmonary embolism.     11/09/2024    1:55 PM 10/10/2023    8:41 AM 06/11/2023    1:11 PM 03/14/2023   10:43 AM 01/15/2022    1:14 PM  Depression screen PHQ 2/9  Decreased Interest 0 1 2 1  0  Down, Depressed, Hopeless 1 1 2 1  0  PHQ - 2 Score 1 2 4 2  0  Altered sleeping 0 0 1 1 1   Tired, decreased energy 1 1 3 1 1   Change in appetite 0 0 1 0 0  Feeling bad or failure about yourself  0 1  2 1 0  Trouble concentrating 0 0 0 0 0  Moving slowly or fidgety/restless 0 0 0 0 0  Suicidal thoughts 0 0 0 0 0  PHQ-9 Score 2 4  11  5  2    Difficult doing work/chores Not difficult at all Not difficult at all Somewhat difficult       Data saved with a previous flowsheet row definition      11/09/2024    1:56 PM 10/10/2023    8:41 AM 06/11/2023    1:11 PM 03/14/2023   10:43 AM  GAD 7 : Generalized Anxiety Score  Nervous, Anxious, on Edge 1 1 2 1   Control/stop worrying 0 0 2 1  Worry too much - different things 0 0 2 1  Trouble relaxing 0 0 2 1   Restless 0 0 1 0  Easily annoyed or irritable 0 0 2 1  Afraid - awful might happen 0 0 2 1  Total GAD 7 Score 1 1 13 6   Anxiety Difficulty Not difficult at all Not difficult at all Somewhat difficult      Allergies  Allergen Reactions   Cefprozil Other (See Comments)    RASH HEAD TO TOE   Gardasil 9 [Human Papillomavirus 9-Valent Recombinant Vaccine]     Reaction to 2nd injection. Hives. Throat symptoms.    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   Migraine 04/16/2016   Nickel allergy    Normal labor 03/06/2024   Obesity 11/11/2011   PT HAS LOST AT LEAST 100 LBS OVER PAST 9 TO 10 MONTHS-NO LONGER OBESE   Overactive bladder    NO LONGER A PROBLEM   PONV (postoperative nausea and vomiting)    NAUSEA AFTER ONE SURGERY AS A CHILD   Seasonal allergies 07/23/2021   Social anxiety disorder 11/11/2011   Vesico-ureteral reflux    BLADDER REFLUX AS A CHILD - NO PROBLEM NOW   Past Surgical History:  Procedure Laterality Date   broken arm  2002   left, repair of ulna and radius   CHOLECYSTECTOMY N/A 08/09/2014   Procedure: LAPAROSCOPIC CHOLECYSTECTOMY WITH INTRAOPERATIVE CHOLANGIOGRAM;  Surgeon:  Camellia CHRISTELLA Blush, MD;  Location: [facility];  Service: General;  Laterality: N/A;   Tympanostomy with myringotomy Bilateral 1996   WISDOM TOOTH EXTRACTION  10/23/2012   Family History  Problem Relation Age of Onset   Stroke Father    Heart disease Father    Depression Father    Diabetes Father        type 2  Other Father        muscular spasm of the heart   Hyperlipidemia Father    Heart attack Father    Hyperlipidemia Maternal Grandmother    Hypertension Maternal Grandmother    Diabetes Maternal Grandmother        type 2   Skin cancer Maternal Grandmother    Cancer Maternal Grandfather        laryngeal, alcohol and tobacco   Other Paternal Grandmother        arrythmia   Diabetes Paternal Grandmother        type 2   Pancreatic cancer Paternal Grandmother 52   Diabetes Paternal Grandfather        type 2   Hyperlipidemia Paternal Grandfather    Other Paternal Grandfather        fluid around the heart   Rheum arthritis Paternal Grandfather    Alzheimer's disease Paternal Grandfather    Allergies as of 11/09/2024       Reactions   Cefprozil Other (See Comments)   RASH HEAD TO TOE   Gardasil 9 [human Papillomavirus 9-valent Recombinant Vaccine]    Reaction to 2nd injection. Hives. Throat symptoms.    Macrobid  [nitrofurantoin ] Nausea And Vomiting   Nickel    Rash with contact   Sulfa Antibiotics    NAUSEA, GI UPSET        Medication List        Accurate as of November 09, 2024  2:48 PM. If you have any questions, ask your nurse or doctor.          busPIRone 7.5 MG tablet Commonly known as: BUSPAR Take 1 tablet (7.5 mg total) by mouth 2 (two) times daily.   cyanocobalamin  1000 MCG tablet Commonly known as: VITAMIN B12 Take 3,000 mcg by mouth daily.   famotidine  20 MG tablet Commonly known as: PEPCID  Take 20 mg by mouth 2 (two) times daily. What changed:  when to take this reasons to take this   Magnesium  250 MG Caps Take by mouth.   pantoprazole  40 MG  tablet Commonly known as: PROTONIX  Take 40 mg by mouth daily. What changed:  when to take this reasons to take this   ZyrTEC  Allergy 10 MG tablet Generic drug: cetirizine  What changed: See the new instructions.        All past medical history, surgical history, allergies, family history, immunizations andmedications were updated in the EMR today and reviewed under the history and medication portions of their EMR.     Review of Systems  All other systems reviewed and are negative.  Negative, with the exception of above mentioned in HPI   Objective:  BP 128/78   Pulse 71   Wt 290 lb (131.5 kg)   SpO2 100%   BMI 51.37 kg/m  Body mass index is 51.37 kg/m. Physical Exam Vitals and nursing note reviewed.  Constitutional:      General: She is not in acute distress.    Appearance: Normal appearance. She is not ill-appearing, toxic-appearing or diaphoretic.  HENT:     Head: Normocephalic and atraumatic.     Nose: Nasal tenderness present. No congestion or rhinorrhea.     Comments: Small area of dry/scaly patch inside left lateral nostril.  Eyes:     General: No scleral icterus.       Right eye: No discharge.        Left eye: No discharge.     Extraocular Movements: Extraocular movements intact.  Conjunctiva/sclera: Conjunctivae normal.     Pupils: Pupils are equal, round, and reactive to light.  Cardiovascular:     Rate and Rhythm: Normal rate and regular rhythm.  Pulmonary:     Effort: Pulmonary effort is normal. No respiratory distress.     Breath sounds: Normal breath sounds. No wheezing, rhonchi or rales.  Musculoskeletal:     Right lower leg: No edema.     Left lower leg: No edema.  Skin:    General: Skin is warm.     Findings: No rash.  Neurological:     Mental Status: She is alert and oriented to person, place, and time. Mental status is at baseline.     Motor: No weakness.     Gait: Gait normal.  Psychiatric:        Mood and Affect: Mood normal.         Behavior: Behavior normal.        Thought Content: Thought content normal.        Judgment: Judgment normal.     No results found. No results found. No results found for this or any previous visit (from the past 24 hours).  Assessment/Plan: Robin Suarez is a 28 y.o. female present for OV for  Anxiety (Primary) Suspect her chest discomfort is caused partially by her anxiety with PVCs. She has discontinued the phentermine  which should help reduce racing heart and PVCs. She has been intolerant to Effexor  and Celexa  in the past, which made her anxiety worse. Continue BuSpar 7.5 mg BID  Costochondritis resolved  Internal nasal lesion Encouraged continued nasal saline flushes BID Humidifier use may be helpful when use heat during winter months.  BB ointment BID If area is not healed within 2-3 weeks, TC and referral will be placed to ENT   Reviewed expectations re: course of current medical issues. Discussed self-management of symptoms. Outlined signs and symptoms indicating need for more acute intervention. Patient verbalized understanding and all questions were answered. Patient received an After-Visit Summary.  Return in about 24 weeks (around 04/26/2025) for cpe (20 min), Routine chronic condition follow-up.   No orders of the defined types were placed in this encounter.  No orders of the defined types were placed in this encounter.  Referral Orders  No referral(s) requested today     Note is dictated utilizing voice recognition software. Although note has been proof read prior to signing, occasional typographical errors still can be missed. If any questions arise, please do not hesitate to call for verification.   electronically signed by:  Charlies Bellini, DO  [facility] [place]

## 2024-11-09 NOTE — Patient Instructions (Addendum)

## 2024-12-17 ENCOUNTER — Ambulatory Visit: Payer: Self-pay | Admitting: *Deleted

## 2024-12-17 NOTE — Progress Notes (Signed)
 Subjective  Patient is a 28 y.o. female here c/w cough x 1 week.  She notes 1 day history of fever, R sided lower chest pain, especially with deep inspiration, and cough.  No advil  or tylenol  > 8 hours.    Review of Systems  Constitutional:  Positive for fatigue and fever. Negative for chills.  HENT:  Positive for congestion, postnasal drip, rhinorrhea and sore throat. Negative for ear discharge, ear pain, sinus pressure, sinus pain and sneezing.   Eyes:  Negative for pain, discharge, redness and itching.  Respiratory:  Positive for cough and shortness of breath. Negative for wheezing.   Gastrointestinal:  Negative for abdominal pain, diarrhea, nausea and vomiting.  Musculoskeletal:  Positive for myalgias. Negative for arthralgias.  Skin:  Negative for rash.  Allergic/Immunologic: Negative for environmental allergies.  Hematological:  Negative for adenopathy. Does not bruise/bleed easily.  Psychiatric/Behavioral:  Negative for confusion and sleep disturbance.        Current Problem List: There is no problem list on file for this patient.   Current Medications on file: Current Outpatient Medications on File Prior to Visit  Medication Sig Dispense Refill   albuterol  108 (90 Base) MCG/ACT inhaler Inhale.     Cyanocobalamin  1000 MCG sublingual tablet 1 tab placed under the tongue daily.     busPIRone  (Buspar ) 7.5 MG tablet Take 7.5 mg by mouth in the morning and 7.5 mg before bedtime.     cetirizine  (ZyrTEC ) 10 MG tablet      famotidine  (Pepcid ) 20 MG tablet Take 20 mg by mouth.     Magnesium  250 MG capsule Take by mouth.     No current facility-administered medications on file prior to visit.    Past Medical History: History reviewed. No pertinent past medical history.   Past Surgical History: History reviewed. No pertinent surgical history.  Social History: Social History   Socioeconomic History   Marital status: Not on file    Spouse name: Not on file   Number of  children: Not on file   Years of education: Not on file   Highest education level: Not on file  Occupational History   Not on file  Tobacco Use   Smoking status: Never   Smokeless tobacco: Never  Substance and Sexual Activity   Alcohol use: Not on file   Drug use: Not on file   Sexual activity: Not on file  Other Topics Concern   Not on file  Social History Narrative   Not on file    Allergies: Allergies  Allergen Reactions   Cefprozil Rash    Other Reaction(s): Other (See Comments)  RASH HEAD TO TOE   Human Papillomavirus 9-Valent Recombinant Vaccine     Reaction to 2nd injection. Hives. Throat symptoms.   Nickel     Rash with contact   Nitrofurantoin  Nausea And Vomiting   Sulfa Antibiotics     NAUSEA, GI UPSET    Objective  Vitals:   12/17/24 1743  BP: 121/86  Pulse: (!) 121  Resp: 18  Temp: (!) 39.2 C (102.6 F)  TempSrc: Tympanic  SpO2: 100%  Weight: 130 kg  Height: 5' 2     No results found.      Physical Exam Vitals and nursing note reviewed.  Constitutional:      General: She is not in acute distress.    Appearance: Normal appearance. She is ill-appearing. She is not toxic-appearing.  HENT:     Head: Normocephalic and atraumatic.  Right Ear: Tympanic membrane and ear canal normal. No swelling or tenderness. Tympanic membrane is not injected, erythematous, retracted or bulging.     Left Ear: Tympanic membrane and ear canal normal. No swelling or tenderness. Tympanic membrane is not injected, erythematous, retracted or bulging.     Nose: Mucosal edema, congestion and rhinorrhea present. Rhinorrhea is clear.     Right Turbinates: Enlarged.     Left Turbinates: Enlarged.     Right Sinus: No maxillary sinus tenderness or frontal sinus tenderness.     Left Sinus: No maxillary sinus tenderness or frontal sinus tenderness.     Mouth/Throat:     Mouth: Mucous membranes are moist.     Pharynx: Oropharynx is clear. Uvula midline.  Posterior oropharyngeal erythema present. No pharyngeal swelling or uvula swelling.     Tonsils: No tonsillar exudate or tonsillar abscesses. 0 on the right. 0 on the left.     Comments: Cobblestone appearance Eyes:     General: No scleral icterus.    Extraocular Movements: Extraocular movements intact.     Conjunctiva/sclera: Conjunctivae normal.  Cardiovascular:     Rate and Rhythm: Regular rhythm. Tachycardia present.     Heart sounds: No murmur heard. Pulmonary:     Effort: Pulmonary effort is normal. No respiratory distress.     Breath sounds: Normal breath sounds. No wheezing, rhonchi or rales.  Musculoskeletal:        General: Normal range of motion.     Cervical back: Normal range of motion and neck supple. No rigidity.  Lymphadenopathy:     Cervical: No cervical adenopathy.  Skin:    General: Skin is warm.  Neurological:     General: No focal deficit present.     Mental Status: She is alert and oriented to person, place, and time.     Motor: No weakness.     Gait: Gait normal.  Psychiatric:        Mood and Affect: Mood normal.        Behavior: Behavior normal.      Results: Results for orders placed or performed in visit on 12/17/24  XR chest 2 views   Narrative   Examination Description: Chest xray, frontal and lateral views  Comparisons: None provided.    Findings The cardiomediastinal silhouette is unremarkable.  The lungs are clear. No consolidation is present.   No pleural effusion is noted.   There is no pneumothorax present.   The soft tissues and bones are unremarkable for age.     Impression   Negative examination for age. No consolidation.  Electronically signed by: Helayne Cork, M.D. on 12/17/2024 19:00:25  POCT RAPID INFLUENZA MCKESSON  Component Result   Rapid Influenza A Ag Negative   Rapid Influenza B Ag Negative   Internal Quality Control Pass  POCT QuickVue Antigen Test  Component Result   Rapid Covid Negative   Internal Quality  Control Pass       Assessment  Robin Suarez was seen today for cough and nasal congestion. Diagnoses and all orders for this visit: Community acquired bacterial pneumonia (Primary) -     POCT RAPID INFLUENZA MCKESSON -     amoxicillin  (Amoxil ) 500 MG capsule; Take 2 capsules (1,000 mg total) by mouth every 8 (eight) hours for 7 days. -     azithromycin  (Zithromax  Z-Pak) 250 MG tablet; Take 2 tablets (500 mg) on  Day 1,  followed by 1 tablet (250 mg) once daily on Days 2 through 5. -  POCT QuickVue Antigen Test Acute cough -     POCT RAPID INFLUENZA MCKESSON -     XR chest 2 views -     POCT QuickVue Antigen Test   As discussed with patient, the radiologist will review the Xray, as the initial read is a preliminary read.  We will contact the patient if radiology has any area of concerns that were not discussed during the visit.  Take medication as prescribed  Go to ED if symptoms worsen or fail to improve  Addendum: radiologist notes no pneumonia, however, pts vitals, history consistent with pneumonia, plus imaging c/w infiltration on right, recommend continue medication as planned.  Called pt to discuss, no answer, LVM To call clinic and advising her to continue medications.      MDM:     1 Acute illness with systemic symptoms     Unique ordered tests: Three+     Review of any test results: Three+     Risk:: Moderate

## 2024-12-17 NOTE — Telephone Encounter (Signed)
 FYI Only or Action Required?: FYI only for provider: UC advised.  Patient was last seen in primary care on 11/09/2024 by Catherine Fuller A, DO.  Called Nurse Triage reporting Nasal Congestion.  Symptoms began a week ago.  Interventions attempted: OTC medications: dayquil.  Symptoms are: gradually worsening.  Triage Disposition: See Physician Within 24 Hours  Patient/caregiver understands and will follow disposition?: Unsure

## 2024-12-17 NOTE — Telephone Encounter (Signed)
 Copied from CRM [id number]. Topic: Clinical - Red Word Triage >> Dec 17, 2024  7:59 AM Robinson H wrote: [facility] that prompted transfer to Nurse Triage: Head cold traveled to chest and lungs, pain in chest and lungs lost voice Reason for Disposition  Earache  Answer Assessment - Initial Assessment Questions Patient declines alternative location appointment- may go to UC today.   1. LOCATION: Where does it hurt?      Deep chest and lung- burning sensation 2. ONSET: When did the sinus pain start?  (e.g., hours, days)      1 week 3. SEVERITY: How bad is the pain?   (Scale 0-10; or none, mild, moderate or severe)     More of burning 4. RECURRENT SYMPTOM: Have you ever had sinus problems before? If Yes, ask: When was the last time? and What happened that time?      Only once- bronchitis,pneumonia- years 5. NASAL CONGESTION: Is the nose blocked? If Yes, ask: Can you open it or must you breathe through your mouth?     Blocked and open 6. NASAL DISCHARGE: Do you have discharge from your nose? If so ask, What color?     Clear to green 7. FEVER: Do you have a fever? If Yes, ask: What is it, how was it measured, and when did it start?      no 8. OTHER SYMPTOMS: Do you have any other symptoms? (e.g., sore throat, cough, earache, difficulty breathing)     Sore throat, cough, deep breath causes pain, earpain  Protocols used: Sinus Pain or Congestion-A-AH

## 2024-12-19 NOTE — Progress Notes (Deleted)
 " Cardiology Office Note:    Date:  12/19/2024   ID:  Robin Suarez, DOB 26-Nov-1996, MRN [id number]  PCP:  Catherine Charlies LABOR, DO   [facility] Providers Cardiologist:  Redell Shallow, MD { Click to update primary MD,subspecialty MD or APP then REFRESH:1}    Referring MD: Catherine Charlies A, DO   No chief complaint on file. ***  History of Present Illness:    Robin Suarez is a 28 y.o. 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  Echocardiogram 05/2022 showed preserved BiV function and no significant valvular disease.  She has a history of gestational hypertension.  She was seen by Dr. Shallow in 2023 for evaluation of dizziness and palpitations.  Does not appear her heart monitor was completed.  Echocardiogram showed LVEF 55-60%, normal RV size and function, no significant valvular disease.  She was seen in the ER 09/24/24 for chest pain and shortness of breath in the setting of positive D-dimer with PCP.  She ruled out with negative enzymes and nonischemic EKG. she is approximately 6 months from delivery.  CTA was negative for PE.  Telemetry notable for PVCs.  She has been taking phentermine  for weight loss.  She presents today for cardiology follow up.      Palpitations PVCs - Wears a smart watch - OTC magnesium  tolerate   MSK chest pain -Inflammatory markers were negative felt not consistent with pericarditis       Past Medical History:  Diagnosis Date   Anxiety and depression 11/11/2011   Chicken pox as a child   Contact dermatitis 06/04/2012   TO NICKLE   Depression with anxiety 11/11/2011   Bipolar disorder screen is negative but borderline.     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   Migraine 04/16/2016   Nickel allergy    Normal labor 03/06/2024   Obesity  11/11/2011   PT HAS LOST AT LEAST 100 LBS OVER PAST 9 TO 10 MONTHS-NO LONGER OBESE   Overactive bladder    NO LONGER A PROBLEM   PONV (postoperative nausea and vomiting)    NAUSEA AFTER ONE SURGERY AS A CHILD   Seasonal allergies 07/23/2021   Social anxiety disorder 11/11/2011   Vesico-ureteral reflux    BLADDER REFLUX AS A CHILD - NO PROBLEM NOW    Past Surgical History:  Procedure Laterality Date   broken arm  2002   left, repair of ulna and radius   CHOLECYSTECTOMY N/A 08/09/2014   Procedure: LAPAROSCOPIC CHOLECYSTECTOMY WITH INTRAOPERATIVE CHOLANGIOGRAM;  Surgeon: Camellia CHRISTELLA Blush, MD;  Location: [facility];  Service: General;  Laterality: N/A;   Tympanostomy with myringotomy Bilateral 1996   WISDOM TOOTH EXTRACTION  10/23/2012    Current Medications: Active Medications[1]   Allergies:   Cefprozil, Gardasil 9 [human papillomavirus 9-valent recombinant vaccine], Macrobid  [nitrofurantoin ], Nickel, and Sulfa antibiotics   Social History   Socioeconomic History   Marital status: Married    Spouse name: Jake   Number of children: Not on file   Years of education: Not on file   Highest education level: Associate degree: occupational, scientist, product/process development, or vocational program  Occupational History   Not on file  Tobacco Use   Smoking status: Never    Passive exposure: Never   Smokeless tobacco: Never  Vaping Use   Vaping status:  Never Used  Substance and Sexual Activity   Alcohol use: Yes    Comment: occ   Drug use: No   Sexual activity: Yes    Birth control/protection: Pill, I.U.D.    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:      -Wears a bicycle helmet riding a bike: Yes     -smoke alarm in the home:Yes     - wears seatbelt: Yes     - Feels safe in their relationships: Yes      Social Drivers of Health    Tobacco Use: Low Risk  (12/17/2024)   Received from [facility]   Patient History    Smoking Tobacco Use: Never    Smokeless Tobacco Use: Never    Passive Exposure: Not on file  Financial Resource Strain: Low Risk (12/16/2024)   Overall Financial Resource Strain (CARDIA)    Difficulty of Paying Living Expenses: Not hard at all  Food Insecurity: No Food Insecurity (12/16/2024)   Epic    Worried About Radiation Protection Practitioner of Food in the Last Year: Never true    Ran Out of Food in the Last Year: Never true  Transportation Needs: No Transportation Needs (12/16/2024)   Epic    Lack of Transportation (Medical): No    Lack of Transportation (Non-Medical): No  Physical Activity: Insufficiently Active (12/16/2024)   Exercise Vital Sign    Days of Exercise per Week: 2 days    Minutes of Exercise per Session: 30 min  Stress: No Stress Concern Present (12/16/2024)   [employer] of Occupational Health - Occupational Stress Questionnaire    Feeling of Stress: Not at all  Social Connections: Moderately Integrated (12/16/2024)   Social Connection and Isolation Panel    Frequency of Communication with Friends and Family: More than three times a week    Frequency of Social Gatherings with Friends and Family: More than three times a week    Attends Religious Services: More than 4 times per year    Active Member of Clubs or Organizations: No    Attends Banker Meetings: Not on file    Marital Status: Married  Depression (PHQ2-9): Low Risk (11/09/2024)   Depression (PHQ2-9)    PHQ-2 Score: 2  Alcohol Screen: Low Risk (12/16/2024)   Alcohol Screen    Last Alcohol Screening Score (AUDIT): 0  Housing: Low Risk (12/16/2024)   Epic    Unable to Pay for Housing in the Last Year: No    Number of Times Moved in the Last Year: 0    Homeless in the Last Year: No  Utilities: Not At Risk (03/05/2024)   AHC Utilities    Threatened with loss of utilities: No  Health Literacy: Not on file      Family History: The patient's ***family history includes Alzheimer's disease in her paternal grandfather; Cancer in her maternal grandfather; Depression in her father; Diabetes in her father, maternal grandmother, paternal grandfather, and paternal grandmother; Heart attack in her father; Heart disease in her father; Hyperlipidemia in her father, maternal grandmother, and paternal grandfather; Hypertension in her maternal grandmother; Other in her father, paternal grandfather, and paternal grandmother; Pancreatic cancer (age of onset: 70) in her paternal grandmother; Rheum arthritis in her paternal grandfather; Skin cancer in her maternal grandmother; Stroke in her father.  ROS:   Please  see the history of present illness.    *** All other systems reviewed and are negative.  EKGs/Labs/Other Studies Reviewed:    The following studies were reviewed today: ***      Recent Labs: 05/12/2024: ALT 30; TSH 2.70 09/24/2024: BUN 10; Creatinine, Ser 0.95; Hemoglobin 14.7; Platelets 312; Potassium 4.1; Pro Brain Natriuretic Peptide 60.6; Sodium 138  Recent Lipid Panel    Component Value Date/Time   CHOL 184 05/12/2024 1417   TRIG 172 (H) 05/12/2024 1417   HDL 54 05/12/2024 1417   CHOLHDL 3.4 05/12/2024 1417   LDLCALC 101 (H) 05/12/2024 1417     Risk Assessment/Calculations:   {Does this patient have ATRIAL FIBRILLATION?[phone]}  No BP recorded.  {Refresh Note OR Click here to enter BP  :1}***         Physical Exam:    VS:  There were no vitals taken for this visit.    Wt Readings from Last 3 Encounters:  11/09/24 290 lb (131.5 kg)  11/05/24 287 lb (130.2 kg)  09/28/24 287 lb (130.2 kg)     GEN: *** Well nourished, well developed in no acute distress HEENT: Normal NECK: No JVD; No carotid bruits LYMPHATICS: No lymphadenopathy CARDIAC: ***RRR, no murmurs, rubs, gallops RESPIRATORY:  Clear to auscultation without rales, wheezing or rhonchi  ABDOMEN: Soft, non-tender,  non-distended MUSCULOSKELETAL:  No edema; No deformity  SKIN: Warm and dry NEUROLOGIC:  Alert and oriented x 3 PSYCHIATRIC:  Normal affect   ASSESSMENT:    No diagnosis found. PLAN:    In order of problems listed above:  ***      {Are you ordering a CV Procedure (e.g. stress test, cath, DCCV, TEE, etc)?   Press F2        :[id number]}    Medication Adjustments/Labs and Tests Ordered: Current medicines are reviewed at length with the patient today.  Concerns regarding medicines are outlined above.  No orders of the defined types were placed in this encounter.  No orders of the defined types were placed in this encounter.   There are no Patient Instructions on file for this visit.   Signed, Jon Nat Hails, [place]  12/19/2024 8:50 PM    [facility]    [1]  No outpatient medications have been marked as taking for the 12/21/24 encounter (Appointment) with Hails Jon Nat, PA.   "

## 2024-12-20 ENCOUNTER — Ambulatory Visit: Admitting: Family Medicine

## 2024-12-21 ENCOUNTER — Ambulatory Visit: Admitting: Physician Assistant

## 2025-01-02 NOTE — Progress Notes (Unsigned)
 "  Cardiology Office Note    Date:  01/03/2025  ID:  Robin Suarez, DOB November 14, 1996, MRN [id number] PCP:  Catherine Charlies LABOR, DO  Cardiologist:  Redell Shallow, MD  Electrophysiologist:  None   Chief Complaint: Follow up for palpitations   History of Present Illness: .    Robin Suarez is a 29 y.o. female with visit-pertinent history of palpitations related to PVCs, obesity no longer on phentermine  given PVCs, and recent MSK chest pain.  Echocardiogram 05/2022 showed preserved BiV function and no significant valvular disease.  She has a history of gestational hypertension.  She was seen by Dr. Shallow in 2023 for evaluation of dizziness and palpitations, cardiac monitor not completed at that time.  Echocardiogram showed LVEF 55-60%, normal RV size and function, no significant valvular disease.  Patient presented to ER on 09/24/24 for chest pain and shortness of breath in the setting of positive D-dimer with PCP.  She ruled out with negative enzymes and nonischemic EKG. She is approximately 6 months from delivery.  CTA was negative for PE.  Telemetry notable for PVCs.  She has been taking phentermine  for weight loss.  She was seen in clinic on 11/05/2024 by Jon Hails, PA for follow-up.  She had described chest pain that started randomly and was intermittent throughout the day and continued for 2 to 3 days.  Chest pain did not change with raising of arms, had a, pleuritic component and no change with laying flat.  Patient noted 100 pound weight gain while pregnant with her son, gave birth 8 months prior.  Inflammatory markers were negative and was felt to not be consistent with pericarditis.  Today she presents for follow-up.  She reports that she has been doing well overall, she reports that her palpitations have significantly improved with use of magnesium  and discontinuation of phentermine .  She notes a occasional sharp pain in the middle of the chest that only occurs when laying down at  night, she reports that her son sleeps on top of her.  She notes this is only a fleeting pain and last only a few seconds then resolves.  She denies any chest pain or shortness of breath with exertion.  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  She denies any orthopnea, PND, presyncope or syncope.  Patient notes that she is exploring options of GLP-1's with her PCP to assist with weight loss.  ROS: .   Today she denies shortness of breath, lower extremity edema, fatigue, melena, hematuria, hemoptysis, diaphoresis, weakness, presyncope, syncope, orthopnea, and PND.  All other systems are reviewed and otherwise negative. Studies Reviewed: SABRA   EKG:  EKG is not ordered today.  CV Studies: Cardiac studies reviewed are outlined and summarized above. Otherwise please see EMR for full report. Cardiac Studies & Procedures   ______________________________________________________________________________________________     ECHOCARDIOGRAM  ECHOCARDIOGRAM COMPLETE 06/07/2022  Narrative ECHOCARDIOGRAM REPORT    Patient Name:   Robin Suarez Date of Exam: 06/07/2022 Medical Rec #:  [id number]         Height:       64.0 in Accession #:    [id number]        Weight:       236.0 lb Date of Birth:  Feb 20, 1996        BSA:          2.099 m Patient Age:    42 years  BP:           100/50 mmHg Patient Gender: F                 HR:           74 bpm. Exam Location:  [address]  Procedure: 2D Echo, 3D Echo, Cardiac Doppler, Color Doppler and Strain Analysis  Indications:    R00.2 Palpitation  History:        Patient has no prior history of Echocardiogram examinations. Risk Factors:Obesity.  Sonographer:    Marshia Lawyer BS, RDCS Referring Phys: [id number] BRIAN S CRENSHAW  IMPRESSIONS   1. Left ventricular ejection fraction, by estimation, is 55 to 60%. The left ventricle has normal function. The left ventricle has no  regional wall motion abnormalities. Left ventricular diastolic parameters were normal. 2. Right ventricular systolic function is normal. The right ventricular size is normal. 3. The mitral valve is normal in structure. Trivial mitral valve regurgitation. No evidence of mitral stenosis. 4. The aortic valve is normal in structure. Aortic valve regurgitation is not visualized. No aortic stenosis is present. 5. The inferior vena cava is normal in size with greater than 50% respiratory variability, suggesting right atrial pressure of 3 mmHg.  FINDINGS Left Ventricle: Left ventricular ejection fraction, by estimation, is 55 to 60%. The left ventricle has normal function. The left ventricle has no regional wall motion abnormalities. The left ventricular internal cavity size was normal in size. There is no left ventricular hypertrophy. Left ventricular diastolic parameters were normal.  Right Ventricle: The right ventricular size is normal. No increase in right ventricular wall thickness. Right ventricular systolic function is normal.  Left Atrium: Left atrial size was normal in size.  Right Atrium: Right atrial size was normal in size.  Pericardium: There is no evidence of pericardial effusion.  Mitral Valve: The mitral valve is normal in structure. Trivial mitral valve regurgitation. No evidence of mitral valve stenosis.  Tricuspid Valve: The tricuspid valve is normal in structure. Tricuspid valve regurgitation is trivial. No evidence of tricuspid stenosis.  Aortic Valve: The aortic valve is normal in structure. Aortic valve regurgitation is not visualized. No aortic stenosis is present.  Pulmonic Valve: The pulmonic valve was normal in structure. Pulmonic valve regurgitation is not visualized. No evidence of pulmonic stenosis.  Aorta: The aortic root is normal in size and structure.  Venous: The inferior vena cava is normal in size with greater than 50% respiratory variability, suggesting  right atrial pressure of 3 mmHg.  IAS/Shunts: No atrial level shunt detected by color flow Doppler.   LEFT VENTRICLE PLAX 2D LVIDd:         5.10 cm   Diastology LVIDs:         3.40 cm   LV e' medial:    14.70 cm/s LV PW:         0.60 cm   LV E/e' medial:  5.8 LV IVS:        0.90 cm   LV e' lateral:   20.20 cm/s LVOT diam:     2.20 cm   LV E/e' lateral: 4.2 LV SV:         86 LV SV Index:   41        2D Longitudinal Strain LVOT Area:     3.80 cm  2D Strain GLS (A2C):   -26.7 % 2D Strain GLS (A3C):   -27.3 % 2D Strain GLS (A4C):   -31.2 % 2D Strain GLS Avg:     -  28.4 %  3D Volume EF: 3D EF:        55 % LV EDV:       139 ml LV ESV:       63 ml LV SV:        76 ml  RIGHT VENTRICLE             IVC RV Basal diam:  3.30 cm     IVC diam: 1.90 cm RV S prime:     16.20 cm/s TAPSE (M-mode): 2.6 cm  LEFT ATRIUM             Index        RIGHT ATRIUM           Index LA diam:        4.00 cm 1.91 cm/m   RA Pressure: 3.00 mmHg LA Vol (A2C):   43.8 ml 20.87 ml/m  RA Area:     12.00 cm LA Vol (A4C):   32.4 ml 15.44 ml/m  RA Volume:   29.90 ml  14.25 ml/m LA Biplane Vol: 39.3 ml 18.72 ml/m AORTIC VALVE LVOT Vmax:   107.00 cm/s LVOT Vmean:  70.200 cm/s LVOT VTI:    0.226 m  AORTA Ao Root diam: 2.60 cm Ao Asc diam:  2.80 cm  MITRAL VALVE               TRICUSPID VALVE Estimated RAP:  3.00 mmHg MV Decel Time: 243 msec MV E velocity: 85.00 cm/s  SHUNTS MV A velocity: 51.60 cm/s  Systemic VTI:  0.23 m MV E/A ratio:  1.65        Systemic Diam: 2.20 cm  Toribio Fuel MD Electronically signed by Toribio Fuel MD Signature Date/Time: 06/07/2022/5:17:56 PM    Final          ______________________________________________________________________________________________       Current Reported Medications:.    Active Medications[1]  Physical Exam:    VS:  BP 128/88   Pulse 68   Ht 5' 3 (1.6 m)   Wt 295 lb 3.2 oz (133.9 kg)   SpO2 99%   BMI 52.29 kg/m    Wt  Readings from Last 3 Encounters:  01/03/25 295 lb 3.2 oz (133.9 kg)  11/09/24 290 lb (131.5 kg)  11/05/24 287 lb (130.2 kg)    GEN: Well nourished, well developed in no acute distress NECK: No JVD; No carotid bruits CARDIAC: RRR, no murmurs, rubs, gallops RESPIRATORY:  Clear to auscultation without rales, wheezing or rhonchi  ABDOMEN: Soft, non-tender, non-distended EXTREMITIES:  No edema; No acute deformity     Asessement and Plan:.    Palpitations/pVCs: Patient noted to have increased palpitations, PVCs captured on smart watch in setting of phentermine  use.  Was recommended that phentermine  be discontinued, patient started a magnesium  supplement.  Today she reports that her PVCs have improved with discontinuation of phentermine  and use of magnesium , notes these only occur with increased stress and quickly resolved.  Patient plans to continue magnesium  and will not restart phentermine .  Chest pain: Patient previously reported chest discomfort that changed with movement and was pleuritic.  Inflammatory markers ruled out pericarditis.  CTA in the ER did not appreciate significant coronary calcification.  Was felt that this was musculoskeletal given atypical nature. Today she reports an occasional sharp pain that only occurs at night and only occurs once then resolved spontaneously.  She notes this only last a few seconds and occurs when her son sleeps on her chest at  night.  She denies any chest pain or shortness of breath on exertion.  She notes some musculoskeletal pain when she reaches up while cleaning, improves with stretching, overall reassuring that her pain is musculoskeletal in nature.  Patient will continue to monitor.  Obesity: Patient previously on phentermine , resulting in significant palpitations and frequent PVCs.  Would not recommend restarting on phentermine .  Would recommend considering GLP-1.   Disposition: Patient will follow-up as needed.  Signed, Deirdre Gryder D Marticia Reifschneider, NP        [1]  Current Meds  Medication Sig   busPIRone  (BUSPAR ) 7.5 MG tablet Take 1 tablet (7.5 mg total) by mouth 2 (two) times daily.   cetirizine  (ZYRTEC  ALLERGY) 10 MG tablet  (Patient taking differently: Take 10 mg by mouth daily as needed.)   cyanocobalamin  (VITAMIN B12) 1000 MCG tablet Take 3,000 mcg by mouth daily.   famotidine  (PEPCID ) 20 MG tablet Take 20 mg by mouth 2 (two) times daily. (Patient taking differently: Take 20 mg by mouth 2 (two) times daily as needed.)   Magnesium  250 MG CAPS Take by mouth.   "

## 2025-01-03 ENCOUNTER — Ambulatory Visit: Attending: Cardiology | Admitting: Cardiology

## 2025-01-03 ENCOUNTER — Encounter: Payer: Self-pay | Admitting: Cardiology

## 2025-01-03 VITALS — BP 128/88 | HR 68 | Ht 63.0 in | Wt 295.2 lb

## 2025-01-03 DIAGNOSIS — R002 Palpitations: Secondary | ICD-10-CM | POA: Diagnosis not present

## 2025-01-03 DIAGNOSIS — R079 Chest pain, unspecified: Secondary | ICD-10-CM | POA: Insufficient documentation

## 2025-01-03 DIAGNOSIS — I493 Ventricular premature depolarization: Secondary | ICD-10-CM | POA: Insufficient documentation

## 2025-01-03 DIAGNOSIS — E66813 Obesity, class 3: Secondary | ICD-10-CM | POA: Insufficient documentation

## 2025-01-03 DIAGNOSIS — Z6841 Body Mass Index (BMI) 40.0 and over, adult: Secondary | ICD-10-CM | POA: Insufficient documentation

## 2025-01-03 NOTE — Patient Instructions (Signed)
 Medication Instructions:  Your physician recommends that you continue on your current medications as directed. Please refer to the Current Medication list given to you today.  *If you need a refill on your cardiac medications before your next appointment, please call your pharmacy*  Lab Work: NONE If you have labs (blood work) drawn today and your tests are completely normal, you will receive your results only by: MyChart Message (if you have MyChart) OR A paper copy in the mail If you have any lab test that is abnormal or we need to change your treatment, we will call you to review the results.  Testing/Procedures: NONE  Follow-Up: At [facility], you and your health needs are our priority.  As part of our continuing mission to provide you with exceptional heart care, our providers are all part of one team.  This team includes your primary Cardiologist (physician) and Advanced Practice Providers or APPs (Physician Assistants and Nurse Practitioners) who all work together to provide you with the care you need, when you need it.  Your next appointment:    As Needed

## 2025-01-14 ENCOUNTER — Ambulatory Visit (INDEPENDENT_AMBULATORY_CARE_PROVIDER_SITE_OTHER): Admitting: Family Medicine

## 2025-01-14 ENCOUNTER — Encounter: Payer: Self-pay | Admitting: Family Medicine

## 2025-01-14 VITALS — BP 126/74 | HR 95 | Temp 98.2°F | Wt 295.2 lb

## 2025-01-14 DIAGNOSIS — J0140 Acute pansinusitis, unspecified: Secondary | ICD-10-CM

## 2025-01-14 MED ORDER — DOXYCYCLINE HYCLATE 100 MG PO TABS: 100.0000 mg | ORAL_TABLET | Freq: Two times a day (BID) | ORAL | 0 refills | Status: AC

## 2025-01-14 NOTE — Patient Instructions (Addendum)

## 2025-01-14 NOTE — Progress Notes (Signed)
 "      Robin Suarez , 06-19-1996, 29 y.o., female MRN: [id number] Patient Care Team    Relationship Specialty Notifications Start End  Catherine Charlies LABOR, DO PCP - General Family Medicine  05/12/24   Pietro Redell RAMAN, MD PCP - Cardiology Cardiology  02/13/22   Obgyn, Anna    01/30/22     Chief Complaint  Patient presents with   Sinus Pressure    Since Monday; head/nasal pressure, ear pain and ringing.      Subjective: Robin Suarez is a 29 y.o. Pt presents for an OV with complaints of sinus pressure and pain  of 5 days duration.  Associated symptoms include ear pain, tinnitus and nasal congestion. She reports she did a very dusty job at the end [employer] and she feels like she has had sinus discomfort since then, worsening over the last 5 days. Pt has tried nasal saline, Zyrtec  and eyedrops to ease their symptoms.      11/09/2024    1:55 PM 10/10/2023    8:41 AM 06/11/2023    1:11 PM 03/14/2023   10:43 AM 01/15/2022    1:14 PM  Depression screen PHQ 2/9  Decreased Interest 0 1 2 1  0  Down, Depressed, Hopeless 1 1 2 1  0  PHQ - 2 Score 1 2 4 2  0  Altered sleeping 0 0 1 1 1   Tired, decreased energy 1 1 3 1 1   Change in appetite 0 0 1 0 0  Feeling bad or failure about yourself  0 1 2 1  0  Trouble concentrating 0 0 0 0 0  Moving slowly or fidgety/restless 0 0 0 0 0  Suicidal thoughts 0 0 0 0 0  PHQ-9 Score 2 4  11  5  2    Difficult doing work/chores Not difficult at all Not difficult at all Somewhat difficult       Data saved with a previous flowsheet row definition    Allergies[1] Social History   Social History Narrative   Marital status/children/pets: Single, G0P0   Education/employment: Automotive Engineer, 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   Migraine 04/16/2016   Nickel allergy    Normal labor 03/06/2024   Obesity 11/11/2011   PT HAS LOST AT LEAST 100 LBS OVER PAST 9 TO 10 MONTHS-NO LONGER OBESE   Overactive bladder    NO LONGER A PROBLEM   PONV (postoperative nausea and vomiting)    NAUSEA AFTER ONE SURGERY AS A CHILD   Seasonal allergies 07/23/2021   Social anxiety disorder 11/11/2011   Vesico-ureteral reflux    BLADDER REFLUX AS A CHILD - NO PROBLEM NOW   Past Surgical History:  Procedure Laterality Date   broken arm  2002   left, repair of ulna and radius  CHOLECYSTECTOMY N/A 08/09/2014   Procedure: LAPAROSCOPIC CHOLECYSTECTOMY WITH INTRAOPERATIVE CHOLANGIOGRAM;  Surgeon: Camellia CHRISTELLA Blush, MD;  Location: [facility];  Service: General;  Laterality: N/A;   Tympanostomy with myringotomy Bilateral 1996   WISDOM TOOTH EXTRACTION  10/23/2012   Family History  Problem Relation Age of Onset   Stroke Father    Heart disease Father    Depression Father    Diabetes Father        type 2   Other Father        muscular spasm of the heart   Hyperlipidemia Father    Heart attack Father    Hyperlipidemia Maternal Grandmother    Hypertension Maternal Grandmother    Diabetes Maternal Grandmother        type 2   Skin cancer Maternal Grandmother    Cancer Maternal Grandfather        laryngeal, alcohol and tobacco   Other Paternal Grandmother        arrythmia   Diabetes Paternal Grandmother        type 2   Pancreatic cancer Paternal Grandmother 76   Diabetes Paternal Grandfather        type 2   Hyperlipidemia Paternal Grandfather    Other Paternal Grandfather        fluid around the heart    Rheum arthritis Paternal Grandfather    Alzheimer's disease Paternal Grandfather    Allergies as of 01/14/2025       Reactions   Cefprozil Other (See Comments)   RASH HEAD TO TOE   Gardasil 9 [human Papillomavirus 9-valent Recombinant Vaccine]    Reaction to 2nd injection. Hives. Throat symptoms.    Macrobid  [nitrofurantoin ] Nausea And Vomiting   Nickel    Rash with contact   Sulfa Antibiotics    NAUSEA, GI UPSET        Medication List        Accurate as of January 14, 2025  2:51 PM. If you have any questions, ask your nurse or doctor.          STOP taking these medications    pantoprazole  40 MG tablet Commonly known as: PROTONIX  Stopped by: Charlies Bellini, DO       TAKE these medications    busPIRone  7.5 MG tablet Commonly known as: BUSPAR  Take 1 tablet (7.5 mg total) by mouth 2 (two) times daily.   cyanocobalamin  1000 MCG tablet Commonly known as: VITAMIN B12 Take 3,000 mcg by mouth daily.   doxycycline  100 MG tablet Commonly known as: VIBRA -TABS Take 1 tablet (100 mg total) by mouth 2 (two) times daily. Started by: Charlies Bellini, DO   famotidine  20 MG tablet Commonly known as: PEPCID  Take 20 mg by mouth 2 (two) times daily. What changed:  when to take this reasons to take this   Magnesium  250 MG Caps Take by mouth.   ZyrTEC  Allergy 10 MG tablet Generic drug: cetirizine  What changed: See the new instructions.        All past medical history, surgical history, allergies, family history, immunizations andmedications were updated in the EMR today and reviewed under the history and medication portions of their EMR.     Review of Systems  Constitutional:  Positive for malaise/fatigue. Negative for chills and fever.  HENT:  Positive for congestion, ear pain, sinus pain and tinnitus.   Eyes: Negative.   Respiratory: Negative.    Cardiovascular: Negative.   Gastrointestinal: Negative.   Genitourinary: Negative.   Musculoskeletal: Negative.  Skin:  Negative for rash.  Neurological: Negative.   Endo/Heme/Allergies: Negative.   Psychiatric/Behavioral: Negative.     Negative, with the exception of above mentioned in HPI   Objective:  BP 126/74   Pulse 95   Temp 98.2 F (36.8 C)   Wt 295 lb 3.2 oz (133.9 kg)   SpO2 97%   BMI 52.29 kg/m  Body mass index is 52.29 kg/m. Physical Exam Vitals and nursing note reviewed.  Constitutional:      General: She is not in acute distress.    Appearance: Normal appearance. She is normal weight. She is not ill-appearing or toxic-appearing.  HENT:     Head: Normocephalic and atraumatic.     Right Ear: Ear canal and external ear normal. A middle ear effusion is present. There is no impacted cerumen. Tympanic membrane is not erythematous.     Left Ear: Ear canal and external ear normal. A middle ear effusion is present. There is no impacted cerumen. Tympanic membrane is not erythematous.     Nose: Congestion and rhinorrhea present. Rhinorrhea is bloody.     Right Sinus: Maxillary sinus tenderness and frontal sinus tenderness present.     Left Sinus: Maxillary sinus tenderness and frontal sinus tenderness present.     Comments: Dry, red, tender bilateral nostrils    Mouth/Throat:     Mouth: Mucous membranes are dry.     Pharynx: No oropharyngeal exudate or posterior oropharyngeal erythema.  Eyes:     General: No scleral icterus.       Right eye: No discharge.        Left eye: No discharge.     Extraocular Movements: Extraocular movements intact.     Conjunctiva/sclera: Conjunctivae normal.     Pupils: Pupils are equal, round, and reactive to light.  Cardiovascular:     Rate and Rhythm: Normal rate and regular rhythm.     Heart sounds: No murmur heard. Pulmonary:     Effort: Pulmonary effort is normal. No respiratory distress.     Breath sounds: Normal breath sounds. No wheezing, rhonchi or rales.  Musculoskeletal:     Cervical back: Neck supple.  Lymphadenopathy:      Cervical: No cervical adenopathy.  Skin:    Findings: No rash.  Neurological:     Mental Status: She is alert and oriented to person, place, and time. Mental status is at baseline.     Motor: No weakness.     Coordination: Coordination normal.     Gait: Gait normal.  Psychiatric:        Mood and Affect: Mood normal.        Behavior: Behavior normal.        Thought Content: Thought content normal.        Judgment: Judgment normal.      No results found. No results found. No results found for this or any previous visit (from the past 24 hours).  Assessment/Plan: Robin Suarez is a 29 y.o. female present for OV for  Acute non-recurrent pansinusitis (Primary) Rest, hydrate well. Nasal saline washes twice daily recommended. Recommend using moist heat and a home.   Doxycycline  twice daily x 10 days prescribed for bacterial infection Continue Zyrtec . Follow-up as needed  Reviewed expectations re: course of current medical issues. Discussed self-management of symptoms. Outlined signs and symptoms indicating need for more acute intervention. Patient verbalized understanding and all questions were answered. Patient received an After-Visit Summary.    No orders of the defined types  were placed in this encounter.  Meds ordered this encounter  Medications   doxycycline  (VIBRA -TABS) 100 MG tablet    Sig: Take 1 tablet (100 mg total) by mouth 2 (two) times daily.    Dispense:  20 tablet    Refill:  0   Referral Orders  No referral(s) requested today     Note is dictated utilizing voice recognition software. Although note has been proof read prior to signing, occasional typographical errors still can be missed. If any questions arise, please do not hesitate to call for verification.   electronically signed by:  Charlies Bellini, DO  [facility] [place]       [1]  Allergies Allergen Reactions   Cefprozil Other (See Comments)    RASH HEAD TO TOE   Gardasil 9  [Human Papillomavirus 9-Valent Recombinant Vaccine]     Reaction to 2nd injection. Hives. Throat symptoms.    Macrobid  [Nitrofurantoin ] Nausea And Vomiting   Nickel     Rash with contact   Sulfa Antibiotics     NAUSEA, GI UPSET   "

## 2025-03-09 ENCOUNTER — Ambulatory Visit: Payer: Self-pay | Admitting: Neurology

## 2025-04-26 ENCOUNTER — Encounter: Admitting: Family Medicine
